# Patient Record
Sex: Female | Born: 1940 | Race: White | Hispanic: No | Smoking: Former smoker
Health system: Southern US, Community
[De-identification: ages and names within clinical notes are randomized; demographics above are authoritative.]

## PROBLEM LIST (undated history)

## (undated) DIAGNOSIS — K449 Diaphragmatic hernia without obstruction or gangrene: Secondary | ICD-10-CM

## (undated) DIAGNOSIS — M1990 Unspecified osteoarthritis, unspecified site: Secondary | ICD-10-CM

## (undated) DIAGNOSIS — F32A Depression, unspecified: Secondary | ICD-10-CM

## (undated) DIAGNOSIS — M549 Dorsalgia, unspecified: Secondary | ICD-10-CM

## (undated) DIAGNOSIS — F419 Anxiety disorder, unspecified: Secondary | ICD-10-CM

## (undated) DIAGNOSIS — G8929 Other chronic pain: Secondary | ICD-10-CM

## (undated) DIAGNOSIS — H269 Unspecified cataract: Secondary | ICD-10-CM

## (undated) DIAGNOSIS — I10 Essential (primary) hypertension: Secondary | ICD-10-CM

## (undated) DIAGNOSIS — M810 Age-related osteoporosis without current pathological fracture: Secondary | ICD-10-CM

## (undated) DIAGNOSIS — R011 Cardiac murmur, unspecified: Secondary | ICD-10-CM

## (undated) DIAGNOSIS — J449 Chronic obstructive pulmonary disease, unspecified: Secondary | ICD-10-CM

## (undated) DIAGNOSIS — Z5189 Encounter for other specified aftercare: Secondary | ICD-10-CM

## (undated) DIAGNOSIS — T7840XA Allergy, unspecified, initial encounter: Secondary | ICD-10-CM

## (undated) DIAGNOSIS — K8020 Calculus of gallbladder without cholecystitis without obstruction: Secondary | ICD-10-CM

## (undated) DIAGNOSIS — K5790 Diverticulosis of intestine, part unspecified, without perforation or abscess without bleeding: Secondary | ICD-10-CM

## (undated) DIAGNOSIS — I499 Cardiac arrhythmia, unspecified: Secondary | ICD-10-CM

## (undated) DIAGNOSIS — E785 Hyperlipidemia, unspecified: Secondary | ICD-10-CM

## (undated) DIAGNOSIS — K279 Peptic ulcer, site unspecified, unspecified as acute or chronic, without hemorrhage or perforation: Secondary | ICD-10-CM

## (undated) DIAGNOSIS — K219 Gastro-esophageal reflux disease without esophagitis: Secondary | ICD-10-CM

## (undated) DIAGNOSIS — IMO0001 Reserved for inherently not codable concepts without codable children: Secondary | ICD-10-CM

## (undated) DIAGNOSIS — F329 Major depressive disorder, single episode, unspecified: Secondary | ICD-10-CM

## (undated) HISTORY — DX: Allergy, unspecified, initial encounter: T78.40XA

## (undated) HISTORY — DX: Unspecified cataract: H26.9

## (undated) HISTORY — DX: Unspecified osteoarthritis, unspecified site: M19.90

## (undated) HISTORY — DX: Anxiety disorder, unspecified: F41.9

## (undated) HISTORY — PX: HERNIA REPAIR: SHX51

## (undated) HISTORY — DX: Major depressive disorder, single episode, unspecified: F32.9

## (undated) HISTORY — DX: Depression, unspecified: F32.A

## (undated) HISTORY — DX: Peptic ulcer, site unspecified, unspecified as acute or chronic, without hemorrhage or perforation: K27.9

## (undated) HISTORY — DX: Cardiac arrhythmia, unspecified: I49.9

## (undated) HISTORY — DX: Hyperlipidemia, unspecified: E78.5

## (undated) HISTORY — DX: Age-related osteoporosis without current pathological fracture: M81.0

## (undated) HISTORY — PX: CHOLECYSTECTOMY: SHX55

## (undated) HISTORY — DX: Gastro-esophageal reflux disease without esophagitis: K21.9

## (undated) HISTORY — DX: Diverticulosis of intestine, part unspecified, without perforation or abscess without bleeding: K57.90

## (undated) HISTORY — DX: Cardiac murmur, unspecified: R01.1

## (undated) HISTORY — DX: Calculus of gallbladder without cholecystitis without obstruction: K80.20

## (undated) HISTORY — DX: Diaphragmatic hernia without obstruction or gangrene: K44.9

## (undated) HISTORY — DX: Essential (primary) hypertension: I10

---

## 1984-11-13 HISTORY — PX: LUMBAR DISC SURGERY: SHX700

## 1999-02-15 ENCOUNTER — Other Ambulatory Visit: Admission: RE | Admit: 1999-02-15 | Discharge: 1999-02-15 | Payer: Self-pay | Admitting: *Deleted

## 1999-04-20 ENCOUNTER — Ambulatory Visit (HOSPITAL_COMMUNITY): Admission: RE | Admit: 1999-04-20 | Discharge: 1999-04-20 | Payer: Self-pay | Admitting: Family Medicine

## 1999-04-20 ENCOUNTER — Encounter: Payer: Self-pay | Admitting: Family Medicine

## 1999-04-26 ENCOUNTER — Ambulatory Visit (HOSPITAL_COMMUNITY): Admission: RE | Admit: 1999-04-26 | Discharge: 1999-04-26 | Payer: Self-pay | Admitting: Family Medicine

## 2000-03-15 ENCOUNTER — Other Ambulatory Visit: Admission: RE | Admit: 2000-03-15 | Discharge: 2000-03-15 | Payer: Self-pay | Admitting: *Deleted

## 2001-07-30 ENCOUNTER — Encounter: Payer: Self-pay | Admitting: Rheumatology

## 2001-07-30 ENCOUNTER — Encounter: Admission: RE | Admit: 2001-07-30 | Discharge: 2001-07-30 | Payer: Self-pay | Admitting: Rheumatology

## 2001-12-23 ENCOUNTER — Encounter: Payer: Self-pay | Admitting: Emergency Medicine

## 2001-12-23 ENCOUNTER — Emergency Department (HOSPITAL_COMMUNITY): Admission: EM | Admit: 2001-12-23 | Discharge: 2001-12-23 | Payer: Self-pay | Admitting: Emergency Medicine

## 2002-04-02 ENCOUNTER — Other Ambulatory Visit: Admission: RE | Admit: 2002-04-02 | Discharge: 2002-04-02 | Payer: Self-pay | Admitting: Family Medicine

## 2002-06-16 ENCOUNTER — Inpatient Hospital Stay (HOSPITAL_COMMUNITY): Admission: EM | Admit: 2002-06-16 | Discharge: 2002-06-17 | Payer: Self-pay | Admitting: Emergency Medicine

## 2002-06-16 ENCOUNTER — Encounter: Payer: Self-pay | Admitting: Emergency Medicine

## 2004-10-12 ENCOUNTER — Ambulatory Visit: Payer: Self-pay | Admitting: Family Medicine

## 2004-10-14 ENCOUNTER — Ambulatory Visit: Payer: Self-pay | Admitting: Cardiology

## 2004-10-25 ENCOUNTER — Ambulatory Visit: Payer: Self-pay | Admitting: Cardiology

## 2004-10-25 ENCOUNTER — Ambulatory Visit: Payer: Self-pay

## 2004-12-09 ENCOUNTER — Ambulatory Visit: Payer: Self-pay | Admitting: Family Medicine

## 2005-01-06 ENCOUNTER — Ambulatory Visit: Payer: Self-pay | Admitting: Family Medicine

## 2005-07-06 ENCOUNTER — Ambulatory Visit: Payer: Self-pay | Admitting: Family Medicine

## 2005-08-22 ENCOUNTER — Ambulatory Visit: Payer: Self-pay | Admitting: Family Medicine

## 2005-11-03 ENCOUNTER — Ambulatory Visit: Payer: Self-pay | Admitting: Family Medicine

## 2005-11-13 HISTORY — PX: OTHER SURGICAL HISTORY: SHX169

## 2006-05-10 ENCOUNTER — Ambulatory Visit: Payer: Self-pay | Admitting: Family Medicine

## 2006-06-13 HISTORY — PX: CERVICAL LAMINECTOMY: SHX94

## 2006-07-03 ENCOUNTER — Ambulatory Visit (HOSPITAL_COMMUNITY): Admission: RE | Admit: 2006-07-03 | Discharge: 2006-07-03 | Payer: Self-pay | Admitting: Neurosurgery

## 2006-07-06 ENCOUNTER — Ambulatory Visit (HOSPITAL_COMMUNITY): Admission: RE | Admit: 2006-07-06 | Discharge: 2006-07-07 | Payer: Self-pay | Admitting: Neurosurgery

## 2006-12-18 ENCOUNTER — Ambulatory Visit: Payer: Self-pay | Admitting: Family Medicine

## 2007-02-11 ENCOUNTER — Ambulatory Visit: Payer: Self-pay | Admitting: Cardiovascular Disease

## 2007-03-05 ENCOUNTER — Ambulatory Visit (HOSPITAL_BASED_OUTPATIENT_CLINIC_OR_DEPARTMENT_OTHER): Admission: RE | Admit: 2007-03-05 | Discharge: 2007-03-05 | Payer: Self-pay | Admitting: Orthopedic Surgery

## 2007-07-03 ENCOUNTER — Encounter: Payer: Self-pay | Admitting: Family Medicine

## 2007-08-16 ENCOUNTER — Telehealth: Payer: Self-pay | Admitting: Family Medicine

## 2007-09-20 ENCOUNTER — Ambulatory Visit: Payer: Self-pay | Admitting: Family Medicine

## 2007-09-20 DIAGNOSIS — J309 Allergic rhinitis, unspecified: Secondary | ICD-10-CM | POA: Insufficient documentation

## 2007-09-20 DIAGNOSIS — I10 Essential (primary) hypertension: Secondary | ICD-10-CM | POA: Insufficient documentation

## 2007-09-20 DIAGNOSIS — M1990 Unspecified osteoarthritis, unspecified site: Secondary | ICD-10-CM | POA: Insufficient documentation

## 2007-09-20 DIAGNOSIS — E785 Hyperlipidemia, unspecified: Secondary | ICD-10-CM

## 2007-09-20 DIAGNOSIS — K219 Gastro-esophageal reflux disease without esophagitis: Secondary | ICD-10-CM

## 2007-09-20 LAB — CONVERTED CEMR LAB
Glucose, Urine, Semiquant: NEGATIVE
Protein, U semiquant: NEGATIVE
Urobilinogen, UA: 0.2
WBC Urine, dipstick: NEGATIVE
pH: 5

## 2007-09-24 LAB — CONVERTED CEMR LAB
Amylase: 70 units/L (ref 27–131)
Basophils Relative: 0.4 % (ref 0.0–1.0)
Bilirubin, Direct: 0.1 mg/dL (ref 0.0–0.3)
CO2: 25 meq/L (ref 19–32)
Eosinophils Absolute: 0.3 10*3/uL (ref 0.0–0.6)
Eosinophils Relative: 5.3 % — ABNORMAL HIGH (ref 0.0–5.0)
GFR calc Af Amer: 129 mL/min
GFR calc non Af Amer: 106 mL/min
Glucose, Bld: 94 mg/dL (ref 70–99)
Hemoglobin: 13.2 g/dL (ref 12.0–15.0)
Lymphocytes Relative: 45.4 % (ref 12.0–46.0)
MCV: 96.1 fL (ref 78.0–100.0)
Monocytes Absolute: 0.4 10*3/uL (ref 0.2–0.7)
Monocytes Relative: 7.7 % (ref 3.0–11.0)
Neutro Abs: 2.2 10*3/uL (ref 1.4–7.7)
Platelets: 307 10*3/uL (ref 150–400)
Potassium: 4.4 meq/L (ref 3.5–5.1)
Total Protein: 6.9 g/dL (ref 6.0–8.3)
WBC: 5.4 10*3/uL (ref 4.5–10.5)

## 2007-09-25 ENCOUNTER — Ambulatory Visit: Payer: Self-pay | Admitting: Cardiology

## 2007-10-02 ENCOUNTER — Ambulatory Visit: Payer: Self-pay | Admitting: Family Medicine

## 2007-10-22 ENCOUNTER — Telehealth: Payer: Self-pay | Admitting: Family Medicine

## 2008-02-10 ENCOUNTER — Telehealth: Payer: Self-pay | Admitting: Family Medicine

## 2008-04-21 ENCOUNTER — Ambulatory Visit: Payer: Self-pay | Admitting: Family Medicine

## 2008-04-21 LAB — CONVERTED CEMR LAB
Bilirubin Urine: NEGATIVE
Glucose, Urine, Semiquant: NEGATIVE
Ketones, urine, test strip: NEGATIVE
Specific Gravity, Urine: 1.01
pH: 6

## 2008-04-29 ENCOUNTER — Telehealth: Payer: Self-pay | Admitting: Family Medicine

## 2008-04-30 ENCOUNTER — Telehealth (INDEPENDENT_AMBULATORY_CARE_PROVIDER_SITE_OTHER): Payer: Self-pay

## 2008-05-04 ENCOUNTER — Ambulatory Visit: Payer: Self-pay | Admitting: Gastroenterology

## 2008-05-05 ENCOUNTER — Telehealth: Payer: Self-pay | Admitting: Family Medicine

## 2008-05-06 ENCOUNTER — Ambulatory Visit: Payer: Self-pay | Admitting: Internal Medicine

## 2008-05-06 LAB — CONVERTED CEMR LAB
BUN: 12 mg/dL (ref 6–23)
CO2: 27 meq/L (ref 19–32)
Chloride: 105 meq/L (ref 96–112)
Eosinophils Absolute: 0.2 10*3/uL (ref 0.0–0.7)
Eosinophils Relative: 2.7 % (ref 0.0–5.0)
GFR calc non Af Amer: 106 mL/min
Lymphocytes Relative: 39.7 % (ref 12.0–46.0)
MCV: 95.3 fL (ref 78.0–100.0)
Monocytes Relative: 7 % (ref 3.0–12.0)
Neutrophils Relative %: 50.4 % (ref 43.0–77.0)
Platelets: 325 10*3/uL (ref 150–400)
Potassium: 4.2 meq/L (ref 3.5–5.1)
WBC: 7 10*3/uL (ref 4.5–10.5)

## 2008-05-07 ENCOUNTER — Telehealth (INDEPENDENT_AMBULATORY_CARE_PROVIDER_SITE_OTHER): Payer: Self-pay

## 2008-05-08 ENCOUNTER — Ambulatory Visit: Payer: Self-pay | Admitting: Cardiology

## 2008-05-21 ENCOUNTER — Ambulatory Visit: Payer: Self-pay | Admitting: Internal Medicine

## 2008-05-26 ENCOUNTER — Telehealth: Payer: Self-pay | Admitting: Internal Medicine

## 2008-05-28 ENCOUNTER — Ambulatory Visit: Payer: Self-pay | Admitting: Internal Medicine

## 2008-05-28 ENCOUNTER — Encounter: Payer: Self-pay | Admitting: Internal Medicine

## 2008-05-28 HISTORY — PX: COLONOSCOPY: SHX174

## 2008-06-04 ENCOUNTER — Encounter: Payer: Self-pay | Admitting: Internal Medicine

## 2008-08-04 ENCOUNTER — Ambulatory Visit: Payer: Self-pay | Admitting: Family Medicine

## 2008-08-04 DIAGNOSIS — J209 Acute bronchitis, unspecified: Secondary | ICD-10-CM

## 2008-09-16 ENCOUNTER — Ambulatory Visit: Payer: Self-pay | Admitting: Internal Medicine

## 2008-09-16 DIAGNOSIS — K5730 Diverticulosis of large intestine without perforation or abscess without bleeding: Secondary | ICD-10-CM | POA: Insufficient documentation

## 2008-09-24 ENCOUNTER — Emergency Department (HOSPITAL_COMMUNITY): Admission: EM | Admit: 2008-09-24 | Discharge: 2008-09-24 | Payer: Self-pay | Admitting: Emergency Medicine

## 2008-09-28 ENCOUNTER — Ambulatory Visit: Payer: Self-pay | Admitting: Family Medicine

## 2008-09-28 DIAGNOSIS — J439 Emphysema, unspecified: Secondary | ICD-10-CM

## 2008-09-28 DIAGNOSIS — F17200 Nicotine dependence, unspecified, uncomplicated: Secondary | ICD-10-CM | POA: Insufficient documentation

## 2008-10-05 ENCOUNTER — Ambulatory Visit: Payer: Self-pay

## 2008-10-05 ENCOUNTER — Encounter: Payer: Self-pay | Admitting: Family Medicine

## 2008-10-09 ENCOUNTER — Telehealth: Payer: Self-pay | Admitting: Family Medicine

## 2008-12-09 ENCOUNTER — Ambulatory Visit: Payer: Self-pay | Admitting: Family Medicine

## 2008-12-09 DIAGNOSIS — R319 Hematuria, unspecified: Secondary | ICD-10-CM

## 2008-12-09 DIAGNOSIS — R1084 Generalized abdominal pain: Secondary | ICD-10-CM | POA: Insufficient documentation

## 2008-12-09 LAB — CONVERTED CEMR LAB
Glucose, Urine, Semiquant: NEGATIVE
Ketones, urine, test strip: NEGATIVE
Nitrite: NEGATIVE
Protein, U semiquant: NEGATIVE
Specific Gravity, Urine: >=1.03
pH: 5

## 2008-12-10 ENCOUNTER — Encounter: Payer: Self-pay | Admitting: Family Medicine

## 2009-02-08 ENCOUNTER — Ambulatory Visit: Payer: Self-pay | Admitting: Family Medicine

## 2009-02-08 DIAGNOSIS — F411 Generalized anxiety disorder: Secondary | ICD-10-CM | POA: Insufficient documentation

## 2009-02-08 LAB — CONVERTED CEMR LAB
Ketones, urine, test strip: NEGATIVE
Specific Gravity, Urine: >=1.03

## 2009-03-02 ENCOUNTER — Ambulatory Visit: Payer: Self-pay | Admitting: Family Medicine

## 2009-03-05 ENCOUNTER — Encounter: Payer: Self-pay | Admitting: Family Medicine

## 2009-03-24 ENCOUNTER — Encounter: Payer: Self-pay | Admitting: Family Medicine

## 2009-05-05 ENCOUNTER — Encounter: Payer: Self-pay | Admitting: Family Medicine

## 2009-05-13 ENCOUNTER — Encounter: Admission: RE | Admit: 2009-05-13 | Discharge: 2009-05-13 | Payer: Self-pay | Admitting: Neurosurgery

## 2009-05-27 ENCOUNTER — Encounter: Payer: Self-pay | Admitting: Family Medicine

## 2009-09-07 ENCOUNTER — Telehealth: Payer: Self-pay | Admitting: Family Medicine

## 2009-09-09 ENCOUNTER — Telehealth: Payer: Self-pay | Admitting: Family Medicine

## 2009-09-10 ENCOUNTER — Ambulatory Visit: Payer: Self-pay | Admitting: Family Medicine

## 2009-09-10 DIAGNOSIS — R252 Cramp and spasm: Secondary | ICD-10-CM

## 2009-09-13 ENCOUNTER — Telehealth (INDEPENDENT_AMBULATORY_CARE_PROVIDER_SITE_OTHER): Payer: Self-pay | Admitting: *Deleted

## 2009-09-13 LAB — CONVERTED CEMR LAB
ALT: 14 U/L
AST: 20 U/L
Albumin: 4.1 g/dL
Alkaline Phosphatase: 77 U/L
BUN: 10 mg/dL
Basophils Absolute: 0 K/uL
Basophils Relative: 0.1 %
Bilirubin, Direct: 0.1 mg/dL
CO2: 28 meq/L
Calcium: 9.3 mg/dL
Chloride: 102 meq/L
Creatinine, Ser: 0.6 mg/dL
Eosinophils Absolute: 0.2 K/uL
Eosinophils Relative: 3.3 %
GFR calc non Af Amer: 105.6 mL/min
Glucose, Bld: 89 mg/dL
HCT: 41.4 %
Hemoglobin: 14 g/dL
Lymphocytes Relative: 35.8 %
Lymphs Abs: 2.5 K/uL
MCHC: 33.9 g/dL
MCV: 103 fL — ABNORMAL HIGH
Monocytes Absolute: 0.5 K/uL
Monocytes Relative: 6.9 %
Neutro Abs: 3.7 K/uL
Neutrophils Relative %: 53.9 %
Platelets: 326 K/uL
Potassium: 3.9 meq/L
RBC: 4.02 M/uL
RDW: 12.1 %
Sodium: 143 meq/L
TSH: 1.31 u[IU]/mL
Total Bilirubin: 0.5 mg/dL
Total Protein: 8.3 g/dL
Vitamin B-12: 272 pg/mL
WBC: 6.9 10*3/microliter

## 2009-09-14 ENCOUNTER — Telehealth: Payer: Self-pay | Admitting: Family Medicine

## 2009-11-09 ENCOUNTER — Ambulatory Visit: Payer: Self-pay | Admitting: Family Medicine

## 2010-02-18 ENCOUNTER — Telehealth: Payer: Self-pay | Admitting: Family Medicine

## 2010-03-25 ENCOUNTER — Telehealth: Payer: Self-pay | Admitting: Family Medicine

## 2010-08-12 ENCOUNTER — Ambulatory Visit: Payer: Self-pay | Admitting: Family Medicine

## 2010-08-12 DIAGNOSIS — R1011 Right upper quadrant pain: Secondary | ICD-10-CM | POA: Insufficient documentation

## 2010-08-17 ENCOUNTER — Encounter: Admission: RE | Admit: 2010-08-17 | Discharge: 2010-08-17 | Payer: Self-pay | Admitting: Family Medicine

## 2010-08-29 ENCOUNTER — Encounter: Payer: Self-pay | Admitting: Family Medicine

## 2010-09-21 ENCOUNTER — Encounter: Payer: Self-pay | Admitting: Family Medicine

## 2010-10-18 ENCOUNTER — Ambulatory Visit: Payer: Self-pay | Admitting: Family Medicine

## 2010-10-18 DIAGNOSIS — K4090 Unilateral inguinal hernia, without obstruction or gangrene, not specified as recurrent: Secondary | ICD-10-CM | POA: Insufficient documentation

## 2010-11-24 ENCOUNTER — Telehealth: Payer: Self-pay | Admitting: Cardiovascular Disease

## 2010-11-24 ENCOUNTER — Encounter: Payer: Self-pay | Admitting: Internal Medicine

## 2010-11-25 ENCOUNTER — Encounter: Payer: Self-pay | Admitting: Internal Medicine

## 2010-11-25 ENCOUNTER — Ambulatory Visit
Admission: RE | Admit: 2010-11-25 | Discharge: 2010-11-25 | Payer: Self-pay | Source: Home / Self Care | Attending: Internal Medicine | Admitting: Internal Medicine

## 2010-11-29 ENCOUNTER — Telehealth: Payer: Self-pay | Admitting: Internal Medicine

## 2010-11-30 ENCOUNTER — Ambulatory Visit
Admission: RE | Admit: 2010-11-30 | Discharge: 2010-11-30 | Payer: Self-pay | Source: Home / Self Care | Attending: Family Medicine | Admitting: Family Medicine

## 2010-12-13 NOTE — Letter (Signed)
Summary: [facility]  [facility]   Imported By: Maryln Gottron 09/30/2010 10:49:50  _____________________________________________________________________  External Attachment:    Type:   Image     Comment:   External Document

## 2010-12-13 NOTE — Progress Notes (Signed)
Summary: refill sertraline  Phone Note From Pharmacy   Caller: [facility] [address]  Call For: refill sertraline  Summary of Call: request for sertraline 100mg  refill Initial call taken by: Duard Brady LPN,  February 18, 2010 4:29 PM  Follow-up for Phone Call        call in #30 with 11 rf Follow-up by: Nelwyn Salisbury MD,  February 18, 2010 5:09 PM    Prescriptions: ZOLOFT 100 MG TABS (SERTRALINE HCL) 1 by mouth once daily  #30 x 11   Entered by:   Duard Brady LPN   Authorized by:   Nelwyn Salisbury MD   Signed by:   Duard Brady LPN on 16/08/9603   Method used:   Faxed to ...       [facility]  [address] # [id number]* (retail)       [address]       [place], [place]  [address]       Ph: [phone] or [phone]       Fax: [phone]   RxID:   [id number]  [facility]ed tp [facility]  KIK

## 2010-12-13 NOTE — Assessment & Plan Note (Signed)
Summary: ?muscle strain in rt side/hurts take deep breaths/cjr   Vital Signs:  Patient profile:   70 year old female Weight:      129 pounds O2 Sat:      95 % Temp:     98.5 degrees F Pulse rate:   58 / minute BP sitting:   110 / 56  (left arm)  Vitals Entered By: Pura Spice, RN (August 12, 2010 1:38 PM) CC: c/o pain rt chest area into back. hurts when takes deep breath. stated painted on Tuesday. some abd discomfort .   History of Present Illness: Here for 2 days of sharp RUQ abdominal pains which radiate around the right side to the right shoulder blade. No SOB but deep breathing makes the pain worse. She does get nausea from time to time. No fever. Appetite is good. No change in urinations or BMs.   Allergies: 1)  ! Codeine 2)  ! Penicillin  Past History:  Past Medical History: Reviewed history from 09/10/2009 and no changes required. 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 Venetia Maxon Left shoulder surgery 07 per Dr. Teressa Senter  Review of Systems  The patient denies anorexia, fever, weight loss, weight gain, vision loss, decreased hearing, hoarseness, syncope, dyspnea on exertion, peripheral edema, prolonged cough, headaches, hemoptysis, melena, hematochezia, severe indigestion/heartburn, hematuria, incontinence, genital sores, muscle weakness, suspicious skin lesions, transient blindness, difficulty walking, depression, unusual weight change, abnormal bleeding, enlarged lymph nodes, angioedema, breast masses, and testicular masses.         Flu Vaccine Consent Questions     Do you have a history of severe allergic reactions to this vaccine? no    Any prior history of allergic reactions to egg and/or gelatin? no    Do you have a sensitivity to the preservative Thimersol? no    Do you have a past  history of Guillan-Barre Syndrome? no    Do you currently have an acute febrile illness? no    Have you ever had a severe reaction to latex? no    Vaccine information given and explained to patient? yes    Are you currently pregnant? no    Lot Number:[id number]   Exp Date:05/13/2011   Site Given  Left Deltoid IM Pura Spice, RN  August 12, 2010 2:07 PM     Physical Exam  General:  Well-developed,well-nourished,in no acute distress; alert,appropriate and cooperative throughout examination Neck:  No deformities, masses, or tenderness noted. Chest Wall:  No deformities, masses, or tenderness noted. Lungs:  Normal respiratory effort, chest expands symmetrically. Lungs are clear to auscultation, no crackles or wheezes. Heart:  Normal rate and regular rhythm. S1 and S2 normal without gallop, murmur, click, rub or other extra sounds. Abdomen:  soft, normal bowel sounds, no distention, no masses, no guarding, no rigidity, no rebound tenderness, no abdominal hernia, no inguinal hernia, no hepatomegaly, and no splenomegaly.  Tender in the RUQ  Msk:  left shoulder has very reduced ROM and it very painful to move   Impression & Recommendations:  Problem # 1:  ABDOMINAL PAIN RIGHT UPPER QUADRANT (ICD-789.01)  Orders: Radiology Referral (Radiology)  Problem # 2:  SHOULDER PAIN (ICD-719.41)  Her updated medication list for this problem includes:    Robaxin-750 750 Mg Tabs (Methocarbamol) .Marland Kitchen... 1 q 6 hours as needed spasm    Aspir-low 81 Mg Tbec (Aspirin) .Marland Kitchen... 1 once daily  pc    Vicodin Hp 10-660 Mg Tabs (Hydrocodone-acetaminophen) .Marland KitchenMarland KitchenMarland KitchenMarland Kitchen 4 times a day  Orders: Orthopedic Surgeon Referral (Ortho Surgeon)  Complete Medication List: 1)  Robaxin-750 750 Mg Tabs (Methocarbamol) .Marland Kitchen.. 1 q 6 hours as needed spasm 2)  Aspir-low 81 Mg Tbec (Aspirin) .Marland Kitchen.. 1 once daily pc 3)  Zoloft 100 Mg Tabs (Sertraline hcl) .Marland Kitchen.. 1 by mouth once daily 4)  Multivitamins Tabs (Multiple vitamin) .Marland Kitchen.. 1 by mouth  once daily 5)  Zantac 150 Mg Caps (Ranitidine hcl) .Marland Kitchen.. 1 by mouth once daily as needed 6)  Ativan 1 Mg Tabs (Lorazepam) .Marland Kitchen.. 1 3  times a day 7)  Atenolol 25 Mg Tabs (Atenolol) .Marland Kitchen.. 1 tablet po once daily 8)  Vicodin Hp 10-660 Mg Tabs (Hydrocodone-acetaminophen) .... 4 times a day 9)  Stool Softener 100 Mg Caps (Docusate sodium) .... 2 capsules by mouth once daily 10)  Wal-mucil Plus Calcium Caps (Psyllium-calcium) .... 5 capsules by mouth once daily 11)  Vitamin D (ergocalciferol) 50000 Unit Caps (Ergocalciferol) .... Take one tablet weekly 12)  Proventil Hfa 108 (90 Base) Mcg/act Aers (Albuterol sulfate) .... Inhale 2 puffs every 4 hours as needed for sob 13)  Hydromet 5-1.5 Mg/34ml Syrp (Hydrocodone-homatropine) .Marland Kitchen.. 1 tsp q 4 hours as needed cough  Other Orders: Admin 1st Vaccine (29528) Flu Vaccine 24yrs + (41324)  Patient Instructions: 1)  refer to Dr. Teressa Senter for left shoulder pain, also get an [place] for the abdomen. I suspect she has gall bladder disease.  Prescriptions: VICODIN HP 10-660 MG  TABS (HYDROCODONE-ACETAMINOPHEN) 4 times a day  #120 x 5   Entered and Authorized by:   Nelwyn Salisbury MD   Signed by:   Nelwyn Salisbury MD on 08/12/2010   Method used:   Print then Give to Patient   RxID:   [id number]

## 2010-12-13 NOTE — Letter (Signed)
Summary: [facility]  [facility] [place]   Imported By: Maryln Gottron 09/06/2010 13:53:44  _____________________________________________________________________  External Attachment:    Type:   Image     Comment:   External Document

## 2010-12-13 NOTE — Progress Notes (Signed)
Summary: REFILL Robaxin  Phone Note Call from Patient   Caller: Patient Call For: Nelwyn Salisbury MD Reason for Call: Refill Medication Summary of Call: WOULD LIKE HER MUSCLE RELAXER [facility]  [phone] Initial call taken by: Duard Brady LPN,  Mar 25, 2010 5:09 PM  Follow-up for Phone Call        call in #120 with 5 rf Follow-up by: Nelwyn Salisbury MD,  Mar 25, 2010 5:13 PM    Prescriptions: ROBAXIN-750 750 MG TABS (METHOCARBAMOL) 1 q 6 hours as needed spasm  #120.0 Each x 5   Entered by:   Kern Reap CMA (AAMA)   Authorized by:   Nelwyn Salisbury MD   Signed by:   Kern Reap CMA (AAMA) on 03/25/2010   Method used:   Electronically to        [facility]  [address] # [id number]* (retail)       [address]       [place], [place]  [address]       Ph: [phone] or [phone]       Fax: [phone]   RxID:   [id number]

## 2010-12-15 NOTE — Assessment & Plan Note (Signed)
Summary: KNOT ON STOMACH//CCM   Vital Signs:  Patient profile:   70 year old female Weight:      129 pounds O2 Sat:      96 % Temp:     97.9 degrees F Pulse rate:   84 / minute BP sitting:   124 / 80  (left arm) Cuff size:   regular  Vitals Entered By: Pura Spice, RN (October 18, 2010 2:20 PM) CC: knot rt lower quadrant    History of Present Illness: Here for 2 months of a painful lump in the right groin and RLQ of the abdomen. BMs are normal. No urinary symptoms.   Allergies: 1)  ! Codeine 2)  ! Penicillin  Past History:  Past Medical History: Reviewed history from 09/10/2009 and no changes required. 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 lumbar disc surgery 1986 Cervical laminectomy 8-07 per Dr. Venetia Maxon Left shoulder surgery 07 per Dr. Teressa Senter  Review of Systems  The patient denies anorexia, fever, weight loss, weight gain, vision loss, decreased hearing, hoarseness, chest pain, syncope, dyspnea on exertion, peripheral edema, prolonged cough, headaches, hemoptysis, melena, hematochezia, severe indigestion/heartburn, hematuria, incontinence, genital sores, muscle weakness, suspicious skin lesions, transient blindness, difficulty walking, depression, unusual weight change, abnormal bleeding, enlarged lymph nodes, angioedema, breast masses, and testicular masses.    Physical Exam  General:  Well-developed,well-nourished,in no acute distress; alert,appropriate and cooperative throughout examination Lungs:  Normal respiratory effort, chest expands symmetrically. Lungs are clear to auscultation, no crackles or wheezes. Heart:  Normal rate and regular rhythm. S1 and S2 normal without gallop, murmur, click, rub or other extra sounds. Abdomen:  soft, normal bowel sounds, no distention, no masses, no  guarding, no rigidity, no rebound tenderness, no hepatomegaly, and no splenomegaly.  Mildly tender in the RLQ. There is a small reducible direct right inguinal hernia    Impression & Recommendations:  Problem # 1:  INGUINAL HERNIA (ICD-550.90)  Orders: Surgical Referral (Surgery)  Complete Medication List: 1)  Robaxin-750 750 Mg Tabs (Methocarbamol) .Marland Kitchen.. 1 q 6 hours as needed spasm 2)  Aspir-low 81 Mg Tbec (Aspirin) .Marland Kitchen.. 1 once daily pc 3)  Zoloft 100 Mg Tabs (Sertraline hcl) .Marland Kitchen.. 1 by mouth once daily 4)  Multivitamins Tabs (Multiple vitamin) .Marland Kitchen.. 1 by mouth once daily 5)  Zantac 150 Mg Caps (Ranitidine hcl) .Marland Kitchen.. 1 by mouth once daily as needed 6)  Ativan 1 Mg Tabs (Lorazepam) .Marland Kitchen.. 1 3  times a day 7)  Atenolol 25 Mg Tabs (Atenolol) .Marland Kitchen.. 1 tablet po once daily 8)  Vicodin Hp 10-660 Mg Tabs (Hydrocodone-acetaminophen) .... 4 times a day 9)  Stool Softener 100 Mg Caps (Docusate sodium) .... 2 capsules by mouth once daily 10)  Wal-mucil Plus Calcium Caps (Psyllium-calcium) .... 5 capsules by mouth once daily 11)  Vitamin D (ergocalciferol) 50000 Unit Caps (Ergocalciferol) .... Take one tablet weekly 12)  Proventil Hfa 108 (90 Base) Mcg/act Aers (Albuterol sulfate) .... Inhale 2 puffs every 4 hours as needed for sob 13)  Hydromet 5-1.5 Mg/30ml Syrp (Hydrocodone-homatropine) .Marland Kitchen.. 1 tsp q 4 hours as needed cough  Patient Instructions: 1)  we will refer her to Surgery   Orders Added: 1)  Est. Patient Level IV [78469] 2)  Surgical Referral [Surgery]

## 2010-12-15 NOTE — Progress Notes (Signed)
Summary: [facility] calling re surgical clearence status   Phone Note From Other Clinic   Caller: [facility] -lisa [phone] Summary of Call: checking on status of surgical clearence Initial call taken by: Glynda Jaeger,  November 24, 2010 10:32 AM  Follow-up for Phone Call        Misty Stanley from [facility] states pt says she saw Dr. Eden Emms in December.  I found no appt at [facility] for this and no office note.  She did see Dr. Clent Ridges in December.  Misty Stanley will follow-up with patient. Mylo Red RN

## 2010-12-15 NOTE — Progress Notes (Signed)
Summary: question on meds  Phone Note Call from Patient Call back at Home Phone [phone]   Caller: Patient Reason for Call: Talk to Nurse Summary of Call: pt has question re meds. Initial call taken by: Roe Coombs,  November 29, 2010 2:49 PM  Follow-up for Phone Call        pt states she was given a Z-pack but the pharmacy gave her liquid which really hurt her stomach.  pt states the antibiotic did not help and she wants something else.  Instructed pt that if antibiotic didn't help - that may not be what she needs and that she should contact her primary care MD for further treatment.  She complaints of mostly sinus drainage, no fever and is concerned that it "doesn't go to her chest".  She will contact her primary MD Follow-up by: Charolotte Capuchin, RN,  November 29, 2010 3:16 PM

## 2010-12-15 NOTE — Assessment & Plan Note (Signed)
Summary: surgical clearance/hernia repair/lg  Medications Added ZITHROMAX 1 GM PACK (AZITHROMYCIN) Take as directed        Visit Type:  Follow-up Primary Provider:  Gershon Crane, MD  CC:  Surgical clearance- hernia repair.  History of Present Illness: patient is a 70 year old with a history of chest pain and palpitations in the past.  She was last seen in cardiology clinic in 2008 by Wende Mott (pre shoulder surgery).  She had a myoview in 2005 that was neg for ischemia. Since she was last seen she has done fairly well  She continues to smoke.  She denies a change in her breathing.  Still able to do some housework.  Limited by arthritis.  Denies chest pains.    Current Medications (verified): 1)  Robaxin-750 750 Mg Tabs (Methocarbamol) .Marland Kitchen.. 1 Q 6 Hours As Needed Spasm 2)  Aspir-Low 81 Mg Tbec (Aspirin) .Marland Kitchen.. 1 Once Daily Pc 3)  Zoloft 100 Mg Tabs (Sertraline Hcl) .Marland Kitchen.. 1 By Mouth Once Daily 4)  Zantac 150 Mg Caps (Ranitidine Hcl) .Marland Kitchen.. 1 By Mouth Once Daily As Needed 5)  Ativan 1 Mg  Tabs (Lorazepam) .Marland Kitchen.. 1 3  Times A Day 6)  Atenolol 25 Mg  Tabs (Atenolol) .Marland Kitchen.. 1 Tablet Po Once Daily 7)  Vicodin Hp 10-660 Mg  Tabs (Hydrocodone-Acetaminophen) .... 4 Times A Day 8)  Stool Softener 100 Mg Caps (Docusate Sodium) .... 2 Capsules By Mouth Once Daily 9)  Proventil Hfa 108 (90 Base) Mcg/act Aers (Albuterol Sulfate) .... Inhale 2 Puffs Every 4 Hours As Needed For Sob  Allergies: 1)  ! Codeine 2)  ! 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 Venetia Maxon Left shoulder surgery 07 per Dr. Teressa Senter  Family History: Last updated: 05/21/2008 Family History of Heart Disease: father No FH of Colon Cancer: Brain  cancer-brother Gastric Ulcers-father  Social History: Last updated: 05/21/2008 Married 3 children 1 boy/2 girls retired Network engineer History:  Patient currently smokes. 1 PPD Alcohol Use - no Daily Caffeine Use 2-3 drinks daily Illicit Drug Use - no  Review of Systems       All systems reviewed.  Neg to the abvoe problem except as noted above.  Vital Signs:  Patient profile:   70 year old female Height:      63 inches Weight:      126.75 pounds BMI:     22.53 Pulse rate:   82 / minute Pulse rhythm:   regular Resp:     18 per minute BP sitting:   102 / 70  (left arm) Cuff size:   regular  Vitals Entered By: Vikki Ports (November 25, 2010 4:02 PM)  Physical Exam  Additional Exam:  patient is in NAD HEENT:  Normocephalic, atraumatic. EOMI, PERRLA.  Neck: JVP is normal. No thyromegaly. No bruits.  Lungs: Some decreased airflow.  No rales no wheezes.  Heart: Regular rate and rhythm. Normal S1, S2. No S3.   No significant murmurs. PMI not displaced.  Abdomen:  Supple, nontender. Normal bowel sounds. No masses. No hepatomegaly.  Extremities:   Good distal pulses throughout. No lower extremity edema.  Musculoskeletal :moving all extremities.  Neuro:   alert and oriented x3.    EKG  Procedure date:  11/25/2010  Findings:      NSR.  82 bpm.  Impression & Recommendations:  Problem # 1:  PRE-OPERATIVE CARDIOVASCULAR EXAMINATION (ICD-V72.81) Patient presents for preop clearance.  She denies symptoms to sugg angina.  She does do some household acitvites without problem.  Overall I think she is at low risk for major cardiac event and is OK to proceed with surgery without furher testing.  Problem # 2:  NICOTINE ADDICTION (ICD-305.1) COunselled on cessation.  Problem # 3:  HYPERTENSION (ICD-401.9) Good control  Continue. Her updated medication list for this problem includes:    Aspir-low 81 Mg Tbec (Aspirin) .Marland Kitchen... 1 once daily pc    Atenolol 25 Mg Tabs  (Atenolol) .Marland Kitchen... 1 tablet po once daily  Problem # 4:  HYPERLIPIDEMIA (ICD-272.4) Not on meds.  SHould be followed.  Other Orders: EKG w/ Interpretation (93000)  Patient Instructions: 1)  Your physician recommends that you continue on your current medications as directed. Please refer to the Current Medication list given to you today. 2)  Your physician discussed the hazards of tobacco use.  Tobacco use cessation is recommended and techniques and options to help you quit were discussed. Prescriptions: ZITHROMAX 1 GM PACK (AZITHROMYCIN) Take as directed  #1 x 0   Entered by:   Lisabeth Devoid RN   Authorized by:   Sherrill Raring, MD, [facility]   Signed by:   Lisabeth Devoid RN on 11/25/2010   Method used:   Electronically to        [facility] # [id number]* (retail)       [address]       [place], [place]  [address]       Ph: [phone] or [phone]       Fax: [phone]   RxID:   [id number]

## 2010-12-15 NOTE — Assessment & Plan Note (Signed)
Summary: ?sinus inf/njr   Vital Signs:  Patient profile:   70 year old female Weight:      126 pounds O2 Sat:      94 % Temp:     98.2 degrees F BP sitting:   130 / 80  (left arm)  Vitals Entered By: Pura Spice, RN (November 30, 2010 1:19 PM) CC: stuffy nose  sneezing   History of Present Illness: Here for one week of chest congestion, coughing up yellow sputum, sinus congestion, and HA. No fever. She took a single dose of Azithromycin a few days ago with no results.   Allergies: 1)  ! Codeine 2)  ! Penicillin  Past History:  Past Medical History: Reviewed history from 09/10/2009 and no changes required. 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 lumbar disc surgery 1986 Cervical laminectomy 8-07 per Dr. Venetia Maxon Left shoulder surgery 07 per Dr. Teressa Senter  Review of Systems  The patient denies anorexia, fever, weight loss, weight gain, vision loss, decreased hearing, hoarseness, chest pain, syncope, dyspnea on exertion, peripheral edema, hemoptysis, abdominal pain, melena, hematochezia, severe indigestion/heartburn, hematuria, incontinence, genital sores, muscle weakness, suspicious skin lesions, transient blindness, difficulty walking, depression, unusual weight change, abnormal bleeding, enlarged lymph nodes, angioedema, breast masses, and testicular masses.    Physical Exam  General:  Well-developed,well-nourished,in no acute distress; alert,appropriate and cooperative throughout examination Head:  Normocephalic and atraumatic without obvious abnormalities. No apparent alopecia or balding. Eyes:  No corneal or conjunctival inflammation noted. EOMI. Perrla. Funduscopic exam benign, without hemorrhages, exudates or papilledema. Vision grossly normal. Ears:  External ear exam shows no significant lesions  or deformities.  Otoscopic examination reveals clear canals, tympanic membranes are intact bilaterally without bulging, retraction, inflammation or discharge. Hearing is grossly normal bilaterally. Nose:  External nasal examination shows no deformity or inflammation. Nasal mucosa are pink and moist without lesions or exudates. Mouth:  Oral mucosa and oropharynx without lesions or exudates.  Teeth in good repair. Neck:  No deformities, masses, or tenderness noted. Lungs:  scattered rhonchi and wheezes Heart:  Normal rate and regular rhythm. S1 and S2 normal without gallop, murmur, click, rub or other extra sounds.   Impression & Recommendations:  Problem # 1:  ACUTE BRONCHITIS (ICD-466.0)  The following medications were removed from the medication list:    Zithromax 1 Gm Pack (Azithromycin) .Marland Kitchen... Take as directed Her updated medication list for this problem includes:    Proventil Hfa 108 (90 Base) Mcg/act Aers (Albuterol sulfate) ..... Inhale 2 puffs every 4 hours as needed for sob    Doxycycline Hyclate 100 Mg Caps (Doxycycline hyclate) .Marland Kitchen..Marland Kitchen Two times a day  Orders: Depo- Medrol 40mg  (J1030) Depo- Medrol 80mg  (J1040) Admin of Therapeutic Inj  intramuscular or subcutaneous (04540)  Complete Medication List: 1)  Robaxin-750 750 Mg Tabs (Methocarbamol) .Marland Kitchen.. 1 q 6 hours as needed spasm 2)  Aspir-low 81 Mg Tbec (Aspirin) .Marland Kitchen.. 1 once daily pc 3)  Zoloft 100 Mg Tabs (Sertraline hcl) .Marland Kitchen.. 1 by mouth once daily 4)  Zantac 150 Mg Caps (Ranitidine hcl) .Marland Kitchen.. 1 by mouth once daily as needed 5)  Ativan 1 Mg Tabs (Lorazepam) .Marland Kitchen.. 1 3  times a day 6)  Atenolol 25 Mg Tabs (Atenolol) .Marland Kitchen.. 1 tablet po once daily 7)  Vicodin Hp 10-660 Mg Tabs (Hydrocodone-acetaminophen) .... 4 times a day 8)  Stool Softener 100 Mg Caps (Docusate sodium) .Marland KitchenMarland KitchenMarland Kitchen  2 capsules by mouth once daily 9)  Proventil Hfa 108 (90 Base) Mcg/act Aers (Albuterol sulfate) .... Inhale 2 puffs every 4 hours as needed for sob 10)   Doxycycline Hyclate 100 Mg Caps (Doxycycline hyclate) .... Two times a day  Patient Instructions: 1)  Please schedule a follow-up appointment as needed .  2)  Use your nebulizer as needed  Prescriptions: DOXYCYCLINE HYCLATE 100 MG CAPS (DOXYCYCLINE HYCLATE) two times a day  #20 x 0   Entered and Authorized by:   Nelwyn Salisbury MD   Signed by:   Nelwyn Salisbury MD on 11/30/2010   Method used:   Electronically to        [facility]  [address] # [id number]* (retail)       [address]       [place], [place]  [address]       Ph: [phone] or [phone]       Fax: [phone]   RxID:   [id number]    Medication Administration  Injection # 1:    Medication: Depo- Medrol 40mg     Diagnosis: ACUTE BRONCHITIS (ICD-466.0)    Route: IM    Site: RUOQ gluteus    Exp Date: 05/2013    Lot #: [id number]    Mfr: Pharmacia    Patient tolerated injection without complications    Given by: Pura Spice, RN (November 30, 2010 2:23 PM)  Injection # 2:    Medication: Depo- Medrol 80mg     Diagnosis: ACUTE BRONCHITIS (ICD-466.0)    Route: IM    Site: RUOQ gluteus    Exp Date: 05/2013    Lot #: [id number]    Mfr: Pharmacia    Patient tolerated injection without complications    Given by: Pura Spice, RN (November 30, 2010 2:23 PM)  Orders Added: 1)  Est. Patient Level IV [27253] 2)  Depo- Medrol 40mg  [J1030] 3)  Depo- Medrol 80mg  [J1040] 4)  Admin of Therapeutic Inj  intramuscular or subcutaneous [66440]

## 2010-12-21 NOTE — Letter (Signed)
Summary: [facility] - Medical Clearance  [facility] - Medical Clearance   Imported By: Marylou Mccoy 12/14/2010 15:54:54  _____________________________________________________________________  External Attachment:    Type:   Image     Comment:   External Document

## 2010-12-31 ENCOUNTER — Other Ambulatory Visit: Payer: Self-pay | Admitting: Family Medicine

## 2011-01-09 ENCOUNTER — Other Ambulatory Visit (HOSPITAL_COMMUNITY): Payer: Self-pay | Admitting: General Surgery

## 2011-01-09 ENCOUNTER — Encounter (HOSPITAL_COMMUNITY)
Admission: RE | Admit: 2011-01-09 | Discharge: 2011-01-09 | Disposition: A | Discharge status: Home / Self Care | Payer: Medicare Other | Source: Ambulatory Visit | Attending: General Surgery | Admitting: General Surgery

## 2011-01-09 ENCOUNTER — Ambulatory Visit (HOSPITAL_COMMUNITY)
Admission: RE | Admit: 2011-01-09 | Discharge: 2011-01-09 | Disposition: A | Discharge status: Home / Self Care | Payer: Medicare Other | Source: Ambulatory Visit | Attending: General Surgery | Admitting: General Surgery

## 2011-01-09 DIAGNOSIS — Z01818 Encounter for other preprocedural examination: Secondary | ICD-10-CM | POA: Insufficient documentation

## 2011-01-09 DIAGNOSIS — Z01810 Encounter for preprocedural cardiovascular examination: Secondary | ICD-10-CM | POA: Insufficient documentation

## 2011-01-09 DIAGNOSIS — K4090 Unilateral inguinal hernia, without obstruction or gangrene, not specified as recurrent: Secondary | ICD-10-CM

## 2011-01-09 DIAGNOSIS — K8020 Calculus of gallbladder without cholecystitis without obstruction: Secondary | ICD-10-CM

## 2011-01-09 DIAGNOSIS — I499 Cardiac arrhythmia, unspecified: Secondary | ICD-10-CM | POA: Insufficient documentation

## 2011-01-09 DIAGNOSIS — Z01812 Encounter for preprocedural laboratory examination: Secondary | ICD-10-CM | POA: Insufficient documentation

## 2011-01-09 DIAGNOSIS — J438 Other emphysema: Secondary | ICD-10-CM | POA: Insufficient documentation

## 2011-01-09 DIAGNOSIS — F17200 Nicotine dependence, unspecified, uncomplicated: Secondary | ICD-10-CM | POA: Insufficient documentation

## 2011-01-09 LAB — COMPREHENSIVE METABOLIC PANEL
ALT: 12 U/L (ref 0–35)
AST: 19 U/L (ref 0–37)
Albumin: 3.7 g/dL (ref 3.5–5.2)
Alkaline Phosphatase: 78 U/L (ref 39–117)
Potassium: 4.7 mEq/L (ref 3.5–5.1)
Sodium: 138 mEq/L (ref 135–145)
Total Protein: 6.5 g/dL (ref 6.0–8.3)

## 2011-01-09 LAB — DIFFERENTIAL
Eosinophils Relative: 3 % (ref 0–5)
Lymphocytes Relative: 39 % (ref 12–46)
Lymphs Abs: 2.8 10*3/uL (ref 0.7–4.0)
Monocytes Absolute: 0.5 10*3/uL (ref 0.1–1.0)
Neutro Abs: 3.6 10*3/uL (ref 1.7–7.7)

## 2011-01-09 LAB — CBC
Platelets: 233 10*3/uL (ref 150–400)
RDW: 12.8 % (ref 11.5–15.5)
WBC: 7.2 10*3/uL (ref 4.0–10.5)

## 2011-01-12 ENCOUNTER — Observation Stay (HOSPITAL_COMMUNITY)
Admission: RE | Admit: 2011-01-12 | Discharge: 2011-01-14 | Disposition: A | Discharge status: Home / Self Care | Payer: Medicare Other | Source: Ambulatory Visit | Attending: General Surgery | Admitting: General Surgery

## 2011-01-12 ENCOUNTER — Other Ambulatory Visit: Payer: Self-pay | Admitting: General Surgery

## 2011-01-12 DIAGNOSIS — F17200 Nicotine dependence, unspecified, uncomplicated: Secondary | ICD-10-CM | POA: Insufficient documentation

## 2011-01-12 DIAGNOSIS — J449 Chronic obstructive pulmonary disease, unspecified: Secondary | ICD-10-CM | POA: Insufficient documentation

## 2011-01-12 DIAGNOSIS — J4489 Other specified chronic obstructive pulmonary disease: Secondary | ICD-10-CM | POA: Insufficient documentation

## 2011-01-12 DIAGNOSIS — Z23 Encounter for immunization: Secondary | ICD-10-CM | POA: Insufficient documentation

## 2011-01-12 DIAGNOSIS — Z8673 Personal history of transient ischemic attack (TIA), and cerebral infarction without residual deficits: Secondary | ICD-10-CM | POA: Insufficient documentation

## 2011-01-12 DIAGNOSIS — K8010 Calculus of gallbladder with chronic cholecystitis without obstruction: Principal | ICD-10-CM | POA: Insufficient documentation

## 2011-01-12 DIAGNOSIS — K219 Gastro-esophageal reflux disease without esophagitis: Secondary | ICD-10-CM | POA: Insufficient documentation

## 2011-01-12 DIAGNOSIS — K4090 Unilateral inguinal hernia, without obstruction or gangrene, not specified as recurrent: Secondary | ICD-10-CM | POA: Insufficient documentation

## 2011-01-17 ENCOUNTER — Other Ambulatory Visit: Payer: Self-pay | Admitting: Family Medicine

## 2011-01-19 NOTE — Op Note (Signed)
NAMEDELAYNIE, STETZER NO.:  [id number]  MEDICAL RECORD NO.:  [id number]           PATIENT TYPE:  I  LOCATION:  5127                         FACILITY:  [facility]  PHYSICIAN:  Cherylynn Ridges, M.D.    DATE OF BIRTH:  03/24/1941  DATE OF PROCEDURE:  01/12/2011 DATE OF DISCHARGE:                              OPERATIVE REPORT   PREOPERATIVE DIAGNOSES:  Symptomatic cholelithiasis and symptomatic right inguinal hernia.  POSTOPERATIVE DIAGNOSES:  Symptomatic cholelithiasis and symptomatic right inguinal hernia with a direct right inguinal hernia.  PROCEDURE: 1. Laparoscopic cholecystectomy. 2. Open right inguinal hernia repair with mesh.  SURGEON:  Cherylynn Ridges, M.D.  ASSISTANT:  Dr. Zachery Dakins assisted on the gallbladder procedure alone. No assistant on the hernia.  ANESTHESIA:  General endotracheal.  ESTIMATED BLOOD LOSS:  Less than 30 mL for the combined procedures.  COMPLICATIONS:  None.  CONDITION:  Stable. FINDINGS:  The patient had direct right inguinal hernia and normal gallbladder anatomy with some mild chronic inflammation.  OPERATION:  The patient was taken to the operating room, placed on table in supine position.  After an adequate time-out was performed identifying the patient and the procedure to be performed, she was prepped initially for the laparoscopic cholecystectomy.  A supraumbilical midline incision was made using #15 blade and taken down to the midline fascia.  We then incised the midline fascia with a 15 blade and while tenting up on the fascia with Kocher clamps, we bluntly dissected down into the peritoneal cavity.  Once this was done, a pursestring suture of 0 Vicryl was passed around the fascial opening which secured in a Hasson cannula which is subsequently passed into the peritoneal cavity.  Once that was done, 2 right upper quadrant 5-mm cannulas and a subxiphoid 5-mm cannula were passed under direct vision.  With the patient  insufflated with carbon dioxide gas, she was placed in reverse Trendelenburg and left-side was tilted down.  The gallbladder was grasped using a ratcheted grasper through the lateral-most cannula and retracted towards the anterior abdominal wall and right upper quadrant.  A second grasper was passed onto the body.  We dissected off some omental adhesions as we got down to the infundibulum.  We grabbed the infundibulum, then opened the window of the peritoneum overlying the triangle of Calot and hepatoduodenal triangle.  We were able to dissect out the cystic duct and cystic artery circumferentially with complete windows around both.  In doing so, we were able to clip the gallbladder along the gallbladder side, then distally the cystic duct x3 and transect the cystic duct.  We identified the cystic artery, clipped it proximally and distally x2, and then transected it.  We then dissected out the gallbladder from its bed with minimal difficulty, retrieving it from the supraumbilical site with minimal difficulty and in an EndoCatch bag.  There was a small spillage of bile during the process.  Once the gallbladder was retrieved, we used a pursestring suture in place at the supraumbilical site to close off the fascia there.  We irrigated with saline in the peritoneal cavity and the gallbladder fossa and  minimal bleeding and bile was noted.  We aspirated all fluid and gas from above the liver, then removed all cannulas.  We injected 0.5% Marcaine with epi at all sites, closed the supraumbilical skin site using running subcuticular stitch of 4-0 Monocryl.  All the others the incision were closed using Dermabond, Steri-Strips, Tegaderm was used also to reinforce the supraumbilical site.  Once these dressings were in place, we broke down the entire draping, reprepped and draped the second time, also a time-out for the right inguinal hernia.  A transverse curvilinear incision was made on the  right side in the suprainguinal level using #10 blade and taken out through the subcutaneous tissue down into Scarpa's to the external oblique fascia. We opened the fascia along its fibers down through the superficial ring. Once we did this, we could identify what appeared to be a large direct defect that extended laterally through the external oblique and transverse abdominis muscle.  This defect was closed using interrupted 0 Ethibond sutures and then reinforced with an oval piece of mesh measuring 5 x 3 cm in size, tacked down using a running 0 Prolene suture.  This was lateral to the round ligament and we did not really have to open up that area in order to repair the hernia.  We then closed the external oblique fascia on top of that and then irrigated with antibiotic solution in which the mesh had been soaked.  We closed Scarpa's fascia using interrupted 3-0 Vicryl and then the skin was closed using a running subcuticular stitch of 4-0 Monocryl.  A 0.5% Marcaine without epinephrine was injected in to the skin and subcutaneous and surrounding areas.  All needle, sponge counts, and instrument counts were correct.  Dermabond, Steri-Strips, and Tegaderm were used to complete the dressing.  All counts were correct.     Cherylynn Ridges, M.D.     JOW/MEDQ  D:  01/12/2011  T:  01/13/2011  Job:  [id number]  Electronically Signed by Jimmye Norman M.D. on 01/18/2011 01:55:23 PM

## 2011-02-15 NOTE — Discharge Summary (Signed)
  NAMESELENA, [name] NO.:  [id number]  MEDICAL RECORD NO.:  [id number]           PATIENT TYPE:  I  LOCATION:  5127                         FACILITY:  [facility]  PHYSICIAN:  Cherylynn Ridges, M.D.    DATE OF BIRTH:  1941-04-15  DATE OF ADMISSION:  01/12/2011 DATE OF DISCHARGE:  01/14/2011                              DISCHARGE SUMMARY   DISCHARGE DIAGNOSES: 1. Symptomatic cholelithiasis. 2. Symptomatic right inguinal hernia.  PRINCIPAL PROCEDURE: 1. Right inguinal hernia repair, open. 2. Laparoscopic cholecystectomy.  SURGEON:  Cherylynn Ridges, M.D.  She was discharged home in care of her family.  Her diet on discharge is regular.  CONDITION:  Stable.  She was able to shower, pat her wounds dry.  She was return to see Dr. Lindie Spruce in 2 weeks.  BRIEF SUMMARY OF HOSPITAL COURSE:  The patient is a 69-year female with a symptomatic right inguinal hernia and symptomatic gallstones who came in for elective repair of both.  This was done on one anesthetic.  She was taken to the operating room on the day of admission, January 12, 2011, underwent an uneventful laparoscopic cholecystectomy and right inguinal hernia repair.  She was discharged home in care of her family.  Vicodin was given to her for pain.  She will return to see me in 2 weeks.     Cherylynn Ridges, M.D.     JOW/MEDQ  D:  01/30/2011  T:  01/31/2011  Job:  [id number]  Electronically Signed by Jimmye Norman M.D. on 02/15/2011 04:26:18 PM

## 2011-02-23 ENCOUNTER — Other Ambulatory Visit: Payer: Self-pay | Admitting: Family Medicine

## 2011-02-24 NOTE — Telephone Encounter (Signed)
Rx called in ,

## 2011-02-24 NOTE — Telephone Encounter (Signed)
May refill once only with further refills per Dr Clent Ridges.

## 2011-02-28 ENCOUNTER — Other Ambulatory Visit: Payer: Self-pay

## 2011-02-28 NOTE — Telephone Encounter (Signed)
rx hydrocodone  10-660 mg

## 2011-03-01 MED ORDER — HYDROCODONE-ACETAMINOPHEN 10-660 MG PO TABS: 1.0000 | ORAL_TABLET | Freq: Four times a day (QID) | ORAL | Status: DC | PRN

## 2011-03-01 NOTE — Telephone Encounter (Signed)
rx called into [facility] 

## 2011-03-01 NOTE — Telephone Encounter (Signed)
Call in #[phone] with 5 rf 

## 2011-03-06 ENCOUNTER — Other Ambulatory Visit: Payer: Self-pay | Admitting: Family Medicine

## 2011-03-31 ENCOUNTER — Other Ambulatory Visit: Payer: Self-pay | Admitting: Family Medicine

## 2011-03-31 NOTE — Telephone Encounter (Signed)
Call in #[phone] with 5 rf 

## 2011-03-31 NOTE — Assessment & Plan Note (Signed)
[facility]                            CARDIOLOGY OFFICE NOTE   [name], [name]                      MRN:          [id number]  DATE:02/11/2007                            DOB:          12-Sep-1941    PRIMARY CARE PHYSICIAN:  Dr. Gershon Crane.   PRIMARY CARDIOLOGIST:  Dr. Charlies Constable.   HISTORY OF PRESENT ILLNESS:  Angelica [name] is a 70 year old female patient  followed by Dr. Clent Ridges who saw Dr. Juanda Chance back in December of 2005 with  complaints of palpitations and chest pain.  Her pain was felt to be  atypical for ischemia and she was set up for a stress nuclear study.  This revealed no evidence of ischemia and an EF of 74%.  She apparently  was treated with beta-blockers for palpitations.  She has done fine  since that time and we have actually not seen her back since then.  She  needs to have some shoulder surgery done in the next couple of weeks.  She just had neck surgery back in August of 2007 and actually did fine  with that.  She denies any recent history of chest pain or shortness of  breath.  She is active and can do most of the activities she wants to  aside from using her shoulder recently without any chest pain or  shortness of breath.  She denies syncope or near syncope.  She denies  any orthopnea or paroxysmal nocturnal dyspnea.  She denies any lower  extremity edema.   CURRENT MEDICATIONS:  1. Toprol-XL 25 mg twice a day.  2. Ativan 0.5 mg q.h.s.  3. Robaxin twice daily.  4. Vytorin 10/40 mg daily.  5. Vicodin p.r.n.  6. Percocet p.r.n.  7. Zantac p.r.n.   ALLERGIES:  1. PENICILLIN.  2. CODEINE.   SOCIAL HISTORY:  1. She continues to smoke cigarettes.  2. She is married and has 3 children.   FAMILY HISTORY:  Positive for coronary artery disease.  Her father had a  myocardial infarction and a CVA and died at age 38.   REVIEW OF SYSTEMS:  Please see HPI.  Denies any fever, chills, cough,  melena, hematochezia, dysuria.  The  rest of the review of systems are  negative.   PHYSICAL EXAM:  She is a well-nourished, well-developed female.  She has  blood pressure 120/76, pulse 91, weight 138 pounds.  HEENT:  Unremarkable.  NECK:  Without JVD.  CARDIAC:  Normal S1, S2.  Regular rate and rhythm without murmurs.  LUNGS:  Clear to auscultation bilaterally without wheezing, rhonchi or  rales.  ABDOMEN:  Soft, nontender.  EXTREMITIES:  Without edema.  SKIN:  Warm and dry.  NEUROLOGIC:  She is alert and oriented x3.  Cranial nerves II-XII  grossly intact.   Electrocardiogram reveals sinus rhythm with a heart rate of 91, normal  axis, nonspecific ST-T wave changes, no significant changes on previous  tracing.   IMPRESSION:  1. Hyperlipidemia.  2. Family history of coronary artery disease.  3. Smoker.  4. Degenerative joint disease.  5. Degenerative disk disease status post  multiple surgeries.  6. History of gastroesophageal reflux disease and peptic ulcer      disease.  7. History of palpitations, now quiescent on beta blocker therapy   PLAN:  The patient presents to the office today for surgical clearance.  She needs to have left shoulder surgery in the next several weeks.  She  is not having any symptoms of chest pain or shortness of breath to  suggest ischemia.  She had a nonischemic Myoview scan in December of  2005.  At this point in time she requires no further cardiac workup  prior to her noncardiac surgery.  According to [facility] and AHA guidelines,  she should be at acceptable risk.  She was also interviewed and examined  by Dr. Eden Emms today.  Our service will certainly be available in the  perioperative period as necessary.      Tereso Newcomer, PA-C  Electronically Signed      Noralyn Pick. Eden Emms, MD, [facility]  Electronically Signed   SW/MedQ  DD: 02/11/2007  DT: 02/11/2007  Job #: [id number]   cc:   Jeannett Senior A. Clent Ridges, MD  Katy Fitch Sypher, M.D.

## 2011-03-31 NOTE — H&P (Signed)
NAME:  Angelica [name], Angelica [name]                         ACCOUNT NO.:  [id number]   MEDICAL RECORD NO.:  [id number]                   PATIENT TYPE:  INP   LOCATION:  1823                                 FACILITY:  [facility]   PHYSICIAN:  Rosalyn Gess. Norins, M.D. [facility]         DATE OF BIRTH:  [birth year]   DATE OF ADMISSION:  06/16/2002  DATE OF DISCHARGE:                                HISTORY & PHYSICAL   CHIEF COMPLAINT:  Chest pain.   HISTORY OF PRESENT ILLNESS:  Angelica [name] is a 70 year old, married, white  female with no prior history of heart disease. She reports the onset of  chest fluttering at about 1600 hours on August 3. Symptoms got worse during  the evening accompanied by a pressure in the left chest with radiation of  minimal discomfort to the neck and left arm described as a mild burning. She  denies any shortness of breath, had no diaphoresis, no nausea. Her symptoms  kept her from being able to sleep until she came to the emergency department  for evaluation. Initial EKG revealed significant ectopy with __________  with no acute injury. Initial enzymes were normal. Dr. [place] started a  nitroglycerin drip which gave the patient approximately a 50 percent or so  relief of her discomfort. She is now admitted to rule out MI.   CARDIAC RISK FACTORS:  Positive family history with her father dying of MI  at 37. The patient is postmenopausal but on hormone replacement therapy.  Positive for tobacco abuse, positive for hyperlipidemia. Negative for  hypertension. Negative for diabetes. Negative for obesity.   PAST MEDICAL HISTORY:  Surgical:  Lumbar diskectomy.  GYN: Gravida 3 para 3.  Medical: Usual childhood diseases. DJD involving neck, low back, hip and  knee.  GERD with a history of hiatal hernia.   MEDICATIONS:  Prempro daily, Celebrex 200 mg once or twice daily, Nexium 40  mg daily, Allegra 180 mg daily, Vicodin 5/500 p.r.n., calcium with vitamin  D, aspirin 81 mg daily.   HABITS:   Tobacco 1 pack per day with a greater than 30 pack-year history,  Caffeine 3 or so 12 ounce cans of diet soda a day. The patient uses alcohol  rarely.   DRUG ALLERGIES:  PENICILLIN with urticaria, CODEINE with restless leg.   FAMILY HISTORY:  With MI in her father at 4. Her mother died at age 28 of  complications of Alzheimer's. No family history for breast cancer, colon  cancer, diabetes.   HEALTH MAINTENANCE:  Patient reports she has had her annual mammogram. She  has not had colon cancer screening. She has had a full physical exam with  Dr. Clent Ridges in the last several months.   REVIEW OF SYMPTOMS:  CONSTITUTIONAL, PULMONARY, CARDIOVASCULAR:  Negative  for problems except as in the HPI GASTROINTESTINAL:  Positive for GERD.  History of ulcer but no change in bowel habit. GYNECOLOGIC:  Negative for  GYN  problems.  MUSCULOSKELETAL:  Positive on MSK as noted.   PHYSICAL EXAMINATION:  VITAL SIGNS:  Temperature was 98, blood pressure  95/60, heart rate was 80, respirations 20, O2 sat was 96 percent on room  air. This is a well-nourished, well-developed woman in no acute distress.  HEENT: Normocephalic, atraumatic. Conjunctivae and sclerae were clear.  Oropharynx without lesions.  NECK: Supple without thyromegaly.  NODES: No adenopathy was noted in the cervical supraclavicular or inguinal  regions.  CHEST: No CVA tenderness. Lungs were clear to auscultation and percussion.  BREASTS:  Deferred to Dr. Clent Ridges.  CARDIOVASCULAR:  Radial pulse 2+, no JVD or carotid bruits. Quiet  precordium.  Her heart rate was regular with premature beats.  ABDOMEN: Tender at the epigastrium and left lower quadrant with no guarding  or rebound. No masses. No organomegaly or splenomegaly.  PELVIC:  Deferred to Dr. Clent Ridges.  RECTAL:  Deferred to Dr. Clent Ridges.  EXTREMITIES: Without clubbing, cyanosis, edema or deformity.  NEUROLOGIC: Grossly nonfocal.   DATABASE:  A 12-lead EKG with normal sinus rhythm with PVCs and  __________ .  She had mild ST-T wave flattening in AV1 and AV2 but no signs of acute  injury.  CK was 81, troponin I was 0.01, hemoglobin 13.1, hematocrit was  38.4, white count was 8,200 with a normal differential.  Chemistries reveal a potassium of  4.1, creatinine 1.9, glucose was 129.  Chest x-ray was negative.   ASSESSMENT:  Cardiovascular. Patient with moderate risk factors with  atypical chest pain and no evidence of  injury on EKG. She is comfortable at  the time of the exam with mild chest pressure. Initial enzymes as noted were  normal. Patient had incomplete relief of her discomfort with nitroglycerin  drip.   PLAN:  1. Rule out MI per protocol. We will discontinue nitroglycerin infusion.     Patient is a candidate for a stress Cardiolite as an outpatient if she     rules out, candidate for cardiology consultation if she rules in. Patient     may need beta blocker long-term for ectopy.  2. GI: History of GERD as noted. Continue P.P.I.  3. Tobacco abuse. Patient is to follow up with Dr. Clent Ridges but she has already     been adamantly encouraged to stop smoking.  4. MSK. Patient to continue on her Celebrex as well as Vicodin as needed.                                               Rosalyn Gess Norins, M.D. [facility]    MEN/MEDQ  D:  06/16/2002  T:  06/20/2002  Job:  [id number]   cc:   Jeannett Senior A. Clent Ridges, M.D. [facility]

## 2011-03-31 NOTE — Discharge Summary (Signed)
   NAME:  Angelica [name], PACIFICO                         ACCOUNT NO.:  [id number]   MEDICAL RECORD NO.:  [id number]                   PATIENT TYPE:  INP   LOCATION:  2004                                 FACILITY:  [facility]   PHYSICIAN:  Stacie Glaze, M.D. [facility]           DATE OF BIRTH:  Dec 23, 1940   DATE OF ADMISSION:  DATE OF DISCHARGE:  06/17/2002                                 DISCHARGE SUMMARY   DISCHARGE DIAGNOSIS:  1. Chest pain.  2) Heart palpitations.   HISTORY OF PRESENT ILLNESS:  Ms. Angelica [name] is a 70 year old white female with  no prior history of coronary disease.  She presents with chest fluttering.  She states that symptoms progressed to left chest pressure with radiation to  the neck and arms.  She denied any shortness of breath, diaphoresis, or  nausea.  The patient presented to the Emergency Department where EKG  revealed significant ectopy but no acute injury.  Initial enzymes were  negative.   PAST MEDICAL HISTORY:  1. Degenerative joint disease and disk disease.  2) Gastroesophageal reflux     disease.  3) Hiatal hernia.  4) Status post lumbar diskectomy.  5)     Tobacco abuse.   HOSPITAL COURSE:  1. Cardiovascular:  The patient presented with chest pain.  She was admitted     to rule out myocardial infarction.  Cardiac enzymes were negative.  EKG     was without ischemia.  The patient has had a stress Cardiolite, initial     results are negative, final results are still pending.  If this is     negative the patient will be discharged home.  The patient has been     started on a beta blocker for her ectopy.  She does describe decreased     sensation of heart fluttering.   1. GI:  The patient has a history of gastroesophageal reflux disease and     will have her continue her proton pump inhibitor.   1. Tobacco abuse:  The patient has been encouraged to stop smoking.   LABORATORY DATA:  Labs on discharge total cholesterol was elevated 223,  triglycerides 197, HDL 57,  LDL 127.  Otherwise CMET and CBC were normal.  Coags were normal. Cardiac enzymes were negative.   DISCHARGE MEDICATIONS:  Allegra 180 mg q.d., Celebrex 200 mg a day as at  home, Nexium 40 mg q.d., Prempro as at home, aspirin 81 mg q.d., Lopressor  25 mg b.i.d.   Followup with Dr. Clent Ridges in 2-3 weeks.     Cornell Barman, PA [facility]                    Stacie Glaze, M.D. [facility]    LC/MEDQ  D:  06/17/2002  T:  06/21/2002  Job:  [id number]   cc:   Tera Mater. Clent Ridges, M.D. [facility]

## 2011-03-31 NOTE — Op Note (Signed)
NAMEDORRAINE, ELLENDER               ACCOUNT NO.:  [id number]   MEDICAL RECORD NO.:  [id number]          PATIENT TYPE:  AMB   LOCATION:  DSC                          FACILITY:  [facility]   PHYSICIAN:  Katy Fitch. Sypher, M.D. DATE OF BIRTH:  06/13/1941   DATE OF PROCEDURE:  03/05/2007  DATE OF DISCHARGE:                               OPERATIVE REPORT   PREOPERATIVE DIAGNOSIS:  Rule out bilateral shoulder adhesive  capsulitis, left greater than right, with AC degenerative change and  rule out significant rotator cuff pathology.   POSTOPERATIVE DIAGNOSES:  1. 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  2. 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   OPERATION:  1. Examination of right shoulder under anesthesia, documenting near      normal range of motion except for slight anterior capsular      contracture.  2. Examination of left shoulder under anesthesia revealing significant      anterior capsular contracture and crepitation consistent with      possible impingement and/or glenohumeral arthrosis.  3. Diagnostic arthroscopy left glenohumeral joint with documentation      of extensive glenohumeral degenerative arthritis with grade III      chondromalacia of humeral head and grade IV chondromalacia of      glenoid, labral degenerative tearing, anterior capsular contracture      and adhesive capsulitis granulation tissues.  4. Subacromial examination with documentation of anterior acromial      osteophyte formation causing stage II impingement with bursitis and      adhesions between bursa and rotator cuff tendons, followed by      acromioplasty and tenolysis of  rotator cuff with bursectomy and      partial debridement of coracoacromial ligament.   OPERATING SURGEON:  Katy Fitch. Sypher, M.D.   ASSISTANT:  Molly Maduro Dasnoit PA-C.   ANESTHESIA:  General endotracheal supplemented by left interscalene  block.   SUPERVISING ANESTHESIOLOGIST:  Guadalupe Maple, M.D.   INDICATIONS:  Brinna Divelbiss is a 70 year old woman referred through  the courtesy of Dr. Gabriel Rung D. Venetia Maxon, Midwife.  She has a history of  bilateral shoulder pain.  Dr. Venetia Maxon had been caring for Ms. Badalamenti's  cervical spine degenerative arthritis.   She has had a very difficult bilateral shoulder pain predicament with  progressive loss of range of motion on the left side.   Our initial impression was that she had adhesive capsulitis with  features of impingement bilaterally.  She was referred for supervised  therapy, but due to a number of factors was only able to complete a  small number of therapy visits.   She did not make significant process and had a severe chronic pain  predicament.   We  advised Ms. Weinel to proceed directly to examination of her  shoulders under anesthesia followed by diagnostic arthroscopy of the  left shoulder, anticipating debridement of the glenohumeral joint,  subacromial decompression and possible distal clavicle resection.   After informed consent, she is brought to the operating room at this  time.   Preoperatively, she was advised that we could not guarantee complete  pain relief nor could we guarantee range of motion recovery in that we  do not have a part store and her illness may be primarily degenerative  in nature.  She understands that should she be found to have a full-  thickness rotator cuff tear, we would proceed with repair of the rotator  cuff.   After informed consent, she is brought to the operating room at this  time.   PROCEDURE:  Shaquala Broeker is brought to the operating room and placed  in supine position upon the  operating table.   Following an anesthesia consultation by Dr. Kipp Brood, general  anesthesia by endotracheal technique supplemented by a left interscalene  block was recommended and accepted by Ms. Furrow.   Preoperatively, her medical records were reviewed including records of  her attending cardiologist.   Ms. Lalley was interviewed in the holding area and questions were  invited and answered.   She was subsequently transferred to room one, placed in the supine  position upon the operating table and under Dr. Morley Kos direct  supervision, general endotracheal anesthesia induced.   She was carefully positioned in the beach-chair position with the aid of  a torso and head holder designed for shoulder arthroscopy with passive  compression devices on her calves for deep vein thrombosis prophylaxis  and careful padding of all of her bony prominences and exposed nerves.   The right shoulder was examined under anesthesia and found to have near  normal motion with elevation 175 degrees external  rotation 90 degrees  at 90 degrees abduction and internal rotation of 80 degrees.  She is  noted to have a very mild anterior capsular contracture with failure to  achieve the scapular plane in extension by 10 degrees.  Examination of  the left shoulder under anesthesia revealed elevation 160 degrees,  external rotation of 85 degrees, internal rotation of 70 degrees and  lack of extension of the scapular plane of -20 degrees.  She appeared to  have primarily a capsular contracture on the left.  She was noted have  subacromial and glenohumeral crepitation with examination under  anesthesia.   After routine DuraPrep of the left forequarter and arm, impervious  arthroscopy drapes were applied.  The arthroscope was introduced through  a standard posterior viewing portal with blunt technique.  Diagnostic arthroscopy immediately revealed grade III chondromalacia the humeral  head, areas of  grade IV chondromalacia on the inferior humeral head and  extensive areas of grade IV chondromalacia of the glenoid.  There was  degenerative change of the anterior labrum, inferior labrum and inferior  posterior labrum.  The long head of the biceps had a stable origin at  the superior labrum and was normal through the rotator interval.  The  deep surface of the rotator cuff was inspected and found to be intact.  The anterior capsule was contracted with granulation tissues anteriorly  superiorly and the deep surface of the supraspinatus obliterating the  anterior capsular ligaments, the subscapularis and the inferior pouch.  A suction shaver was introduced anteriorly and used to perform  debridement of granulation  tissues, debridement of the areas of  chondromalacia with fragments hanging free within the joint on both the  humeral and glenoid sides, followed by thorough irrigation of the  glenohumeral joint removing all debris.  Bipolar cautery was used to  obtain hemostasis in the capsule and to release the anterior capsular  adhesions with the cutting cautery.  Suction shaver was used to remove  the scarred anterior capsule.   After complete debridement of the glenohumeral joint, the scope was  placed in the subacromial space.   The subacromial space was obliterated by adhesive bursitis.  After  thorough bursectomy and tenolysis of the rotator cuff tendons.  The  impinging osteophyte on the anterior surface of the acromion was  documented with a digital camera subsequently leveled with suction  shaver and bur to a type I morphology.  The [facility] joint was not prominent  and did not appear to be part of the pathology.  After thorough  debridement of the subacromial bursa and tenolysis of the tendons, a  small portion of the coracoacromial ligament that was adherent to the  bursa was resected.  Given our findings of significant degenerative  arthritis, the coracoacromial ligament was  preserved anticipating  possible arthroplasty for reconstruction at a later date.   After hemostasis is achieved in the subacromial space and a photographic  documentation of the spur had been completed, the arthroscope was  removed followed by suture of the portals with mattress suture of 3-0  Prolene.   There were no apparent complications.  Ms. Harewood tolerated surgery and  anesthesia well.  She is transferred to the recovery room with stable  vital signs.     Note, for aftercare, she is provided prescriptions for Dilaudid 2 mg  one or two tablets p.o. q.4-6h. p.r.n. pain 30 tablets without refill,  Motrin 600 mg one p.o. q.6h. p.r.n. pain 30 tablets with one refill and  doxycycline 100 mg p.o. b.i.d. x4 days as a prophylactic antibiotic.      Katy Fitch Sypher, M.D.  Electronically Signed     RVS/MEDQ  D:  03/05/2007  T:  03/05/2007  Job:  [id number]   cc:   Danae Orleans. Venetia Maxon, M.D.

## 2011-03-31 NOTE — Op Note (Signed)
NAMEDERENDA, GIDDINGS NO.:  [id number]   MEDICAL RECORD NO.:  [id number]          PATIENT TYPE:  INP   LOCATION:  3019                         FACILITY:  [facility]   PHYSICIAN:  Danae Orleans. Venetia Maxon, M.D.  DATE OF BIRTH:  Oct 16, 1941   DATE OF PROCEDURE:  07/06/2006  DATE OF DISCHARGE:                                 OPERATIVE REPORT   PREOPERATIVE DIAGNOSIS:  Herniated cervical disk with spondylosis,  degenerative disk disease, and radiculopathy, C4-C5 level.   POSTOPERATIVE DIAGNOSIS:  Herniated cervical disk with spondylosis,  degenerative disk disease, and radiculopathy, C4-C5 level.   PROCEDURE:  Anterior cervical decompression and fusion C4-C5 with PEEK  interbody spacer and anterior cervical plate.   SURGEON:  Danae Orleans. Venetia Maxon, M.D.   ANESTHESIA:  General endotracheal anesthesia.   ESTIMATED BLOOD LOSS:  Minimal.   COMPLICATIONS:  None.   DISPOSITION:  To recovery room.   INDICATIONS:  Angelica [name] is a 70 year old woman with a severe left C5  radiculopathy.  She has a herniated disk and spondylosis at C4-C5.  It was  elected to take her to surgery for anterior cervical decompression of the  affected level.   DESCRIPTION OF PROCEDURE:  Angelica [name] was brought to the operating room.  Following satisfactory and uncomplicated induction of general endotracheal  anesthesia and placement of intravenous lines, the patient was placed in the  supine position on the operating table.  Her neck was placed in slight  extension.  She was placed in 10 pounds of halter traction.  Her anterior  neck was then prepped and draped in the usual sterile fashion.  The area of  planned incision was infiltrated with 0.25% Marcaine and 0.5% lidocaine with  1:100,000 epinephrine.  An incision was made from the midline to the  anterior border of the sternocleidomastoid muscle and carried sharply  through the platysma layer.  Subplatysmal dissection was performed exposing  the  anterior border of the sternocleidomastoid muscle. Using blunt  dissection, the carotid sheath was kept lateral and the trachea and  esophagus kept medial exposing the anterior cervical spine.  A bent spinal  needle was placed at what was felt to be the C4-C5 level and this was, in  fact, the C5-C6 level.  A second x-ray was obtained with the needle at the  C4-C5 level and this was confirmed.  Subsequently, the interspace was  incised.  Disk material was removed in a piecemeal fashion.  Prior to doing  so, the longus colli muscles were take down from the anterior cervical spine  and Shadowline self-retaining retractor was placed.  The interspace was  incised and the disk material was removed in a piecemeal fashion.  Endplates  were decorticated with the high speed drill and uncinate spurs were drilled  down and removed in a piecemeal fashion.  Both neural foramen were widely  decompressed.  There was a significant amount of degenerated disk material  in the left neural foramen.  This was decompressed.  Hemostasis was assured  and subsequently after trial sizing, a 6 mm PEEK interbody cage was packed  with Osteocel  and morselized bone autograft inserted in the interspace and  countersunk appropriately.  A 14 mm anterior cervical plate was then affixed  to the anterior cervical spine using 14 mm variable screws, two at C4, two  at C5.  All screws had excellent purchase.  Final x-ray demonstrated well  positioned interbody graft and anterior cervical plate.  The traction was  removed prior to placement of the plate and the plate locking mechanism was  engaged.  The wound was then irrigated with Bacitracin saline.  Soft tissues  were inspected and found to be in good repair.  Hemostasis was assured.  The  platysma layer was then reapproximated with 0 Vicryl sutures and skin edges  reapproximated with interrupted 3-0 Vicryl subcuticular stitch.  The wound  was dressed with Dermabond.  The patient  was extubated in the operating room  and taken to the recovery room in stable satisfactory condition having  tolerated the operation well.  Counts were correct at the end of the case.      Danae Orleans. Venetia Maxon, M.D.  Electronically Signed     JDS/MEDQ  D:  07/06/2006  T:  07/07/2006  Job:  [id number]

## 2011-04-06 NOTE — Telephone Encounter (Signed)
Rx Done . 

## 2011-04-08 ENCOUNTER — Other Ambulatory Visit: Payer: Self-pay | Admitting: Family Medicine

## 2011-04-11 ENCOUNTER — Other Ambulatory Visit: Payer: Self-pay | Admitting: *Deleted

## 2011-05-16 ENCOUNTER — Encounter: Payer: Self-pay | Admitting: Internal Medicine

## 2011-06-05 ENCOUNTER — Telehealth: Payer: Self-pay

## 2011-06-05 NOTE — Telephone Encounter (Signed)
Spoke with pt's daughter who states that pt has shingles and would like to know if something can be sent to pharmacy for her. Pt is unable to schedule ov because her husband passed away on 04-13-2023 and they are very busy with funeral arrangements. Please advise

## 2011-06-05 NOTE — Telephone Encounter (Signed)
This is something I really need to see her for. We treat it with steroids and antiviral meds, and we need to know what we are dealing with

## 2011-06-06 NOTE — Telephone Encounter (Signed)
Left message for pt or daughter to call back to schedule appointment

## 2011-07-11 ENCOUNTER — Other Ambulatory Visit: Payer: Self-pay | Admitting: Family Medicine

## 2011-08-15 LAB — CBC
HCT: 38.6
Hemoglobin: 13.2
MCHC: 34.3
RDW: 12.6

## 2011-08-15 LAB — POCT I-STAT, CHEM 8
BUN: 7
Calcium, Ion: 1.1 — ABNORMAL LOW
TCO2: 20

## 2011-08-15 LAB — DIFFERENTIAL
Basophils Absolute: 0
Basophils Relative: 0
Eosinophils Relative: 4
Lymphocytes Relative: 28
Monocytes Absolute: 0.4

## 2011-08-15 LAB — POCT CARDIAC MARKERS: Myoglobin, poc: 50.7

## 2011-09-07 ENCOUNTER — Other Ambulatory Visit: Payer: Self-pay | Admitting: Family Medicine

## 2011-09-08 ENCOUNTER — Telehealth: Payer: Self-pay | Admitting: Family Medicine

## 2011-09-08 NOTE — Telephone Encounter (Signed)
Pt requesting refill on Hydrocodone-Acetaminophen 10-660

## 2011-09-11 NOTE — Telephone Encounter (Signed)
Call in #[phone] with 5 rf 

## 2011-09-11 NOTE — Telephone Encounter (Signed)
I called in script 

## 2011-09-12 NOTE — Telephone Encounter (Signed)
done

## 2011-10-13 ENCOUNTER — Other Ambulatory Visit: Payer: Self-pay | Admitting: Family Medicine

## 2011-10-13 NOTE — Telephone Encounter (Signed)
Pt last seen 11/30/10.  Pls advise.

## 2011-10-17 ENCOUNTER — Telehealth: Payer: Self-pay | Admitting: Family Medicine

## 2011-10-17 NOTE — Telephone Encounter (Signed)
Refill request for Lorazepam 1 mg take 1 po tid prn and pt last here on 11/30/10.

## 2011-10-18 ENCOUNTER — Ambulatory Visit (INDEPENDENT_AMBULATORY_CARE_PROVIDER_SITE_OTHER): Payer: Medicare Other | Admitting: Family Medicine

## 2011-10-18 ENCOUNTER — Encounter: Payer: Self-pay | Admitting: Family Medicine

## 2011-10-18 VITALS — BP 100/70 | HR 80 | Temp 98.5°F | Wt 123.0 lb

## 2011-10-18 DIAGNOSIS — R05 Cough: Secondary | ICD-10-CM

## 2011-10-18 DIAGNOSIS — J40 Bronchitis, not specified as acute or chronic: Secondary | ICD-10-CM

## 2011-10-18 MED ORDER — AZITHROMYCIN 250 MG PO TABS: ORAL_TABLET | ORAL | Status: AC

## 2011-10-18 MED ORDER — METHYLPREDNISOLONE ACETATE PF 80 MG/ML IJ SUSP
120.0000 mg | Freq: Once | INTRAMUSCULAR | Status: AC
  Administered 2011-10-18: 120 mg via INTRAMUSCULAR

## 2011-10-18 MED ORDER — HYDROCODONE-ACETAMINOPHEN 10-660 MG PO TABS: 1.0000 | ORAL_TABLET | Freq: Four times a day (QID) | ORAL | Status: DC | PRN

## 2011-10-18 MED ORDER — LORAZEPAM 1 MG PO TABS: 1.0000 mg | ORAL_TABLET | Freq: Three times a day (TID) | ORAL | Status: DC | PRN

## 2011-10-18 NOTE — Progress Notes (Signed)
Addended by: Romualdo Bolk on: 10/18/2011 01:46 PM   Modules accepted: Orders

## 2011-10-18 NOTE — Telephone Encounter (Signed)
Call in #[phone] with 5 rf 

## 2011-10-18 NOTE — Progress Notes (Signed)
  Subjective:    Patient ID: Angelica [name], female    DOB: [birth year], 70 y.o.   MRN: [id number]  HPI Here for one week of chest congestion and coughing up green sputum. No fever.    Review of Systems  Constitutional: Negative.   HENT: Negative.   Eyes: Negative.   Respiratory: Positive for cough and wheezing.        Objective:   Physical Exam  Constitutional: She appears well-developed and well-nourished.  HENT:  Right Ear: External ear normal.  Left Ear: External ear normal.  Nose: Nose normal.  Mouth/Throat: Oropharynx is clear and moist. No oropharyngeal exudate.  Eyes: Conjunctivae are normal.  Neck: No thyromegaly present.  Pulmonary/Chest: Effort normal. She has no wheezes. She has no rales.       Scattered rhonchi  Lymphadenopathy:    She has no cervical adenopathy.          Assessment & Plan:  Add Mucinex

## 2011-10-19 NOTE — Telephone Encounter (Signed)
Script was given to pt on 10/18/11 during office visit.

## 2011-12-08 ENCOUNTER — Telehealth: Payer: Self-pay | Admitting: Family Medicine

## 2011-12-08 MED ORDER — METHOCARBAMOL 750 MG PO TABS: 750.0000 mg | ORAL_TABLET | Freq: Four times a day (QID) | ORAL | Status: DC | PRN

## 2011-12-08 NOTE — Telephone Encounter (Signed)
Script sent e-scribe and left a voice message for pt. 

## 2011-12-08 NOTE — Telephone Encounter (Signed)
Needs new rx for Methocarbamol sent to NEW pharmacy----[facility]---[address]. Patient stated that she is out. Thanks.

## 2012-01-02 ENCOUNTER — Other Ambulatory Visit: Payer: Self-pay | Admitting: Family Medicine

## 2012-01-24 ENCOUNTER — Telehealth: Payer: Self-pay | Admitting: Family Medicine

## 2012-01-24 MED ORDER — FLUCONAZOLE 150 MG PO TABS: 150.0000 mg | ORAL_TABLET | Freq: Once | ORAL | Status: AC

## 2012-01-24 MED ORDER — MAGIC MOUTHWASH: 5.0000 mL | ORAL | Status: DC

## 2012-01-24 NOTE — Telephone Encounter (Signed)
Pt called and said that she has had vomiting, diarrhea, weak since last Wednesday. The vomiting stopped Friday night and the diarrhea stopped yesterday. Pt says that her tongue is coated and sore. Pt is req a refill of Diflucan and Magic Mouthwash, called in to [facility] on [address]. Pt said that she can not come in for ov, because she can't drive and daughter is sick with same thing.

## 2012-01-24 NOTE — Telephone Encounter (Signed)
Scripts sent e-scribe 

## 2012-01-24 NOTE — Telephone Encounter (Signed)
Has not been on antibiotics.  Only symptoms were vomiting and diarrhea x 5 days.  Tongue is sore and coated with white rash.  Pt is requesting Diflucan and [facility] Magic Mouth Wash.

## 2012-01-24 NOTE — Telephone Encounter (Signed)
Call in Magic Mouthwash to swish 5 ml in mouth q 4 hours prn, 300 ml with 5 rf. Also call in Diflucan 150 mg q day, #5 with 5 rf

## 2012-01-24 NOTE — Telephone Encounter (Signed)
Left message for pt to call back with more information.

## 2012-01-26 ENCOUNTER — Other Ambulatory Visit: Payer: Self-pay | Admitting: Family Medicine

## 2012-01-31 ENCOUNTER — Encounter: Payer: Self-pay | Admitting: Family Medicine

## 2012-01-31 ENCOUNTER — Ambulatory Visit (INDEPENDENT_AMBULATORY_CARE_PROVIDER_SITE_OTHER): Payer: Medicare Other | Admitting: Family Medicine

## 2012-01-31 DIAGNOSIS — M109 Gout, unspecified: Secondary | ICD-10-CM

## 2012-01-31 LAB — CBC WITH DIFFERENTIAL/PLATELET
Basophils Absolute: 0 10*3/uL (ref 0.0–0.1)
Eosinophils Absolute: 0.3 10*3/uL (ref 0.0–0.7)
HCT: 34.3 % — ABNORMAL LOW (ref 36.0–46.0)
Lymphs Abs: 2.8 10*3/uL (ref 0.7–4.0)
MCHC: 34.3 g/dL (ref 30.0–36.0)
MCV: 99.4 fl (ref 78.0–100.0)
Monocytes Absolute: 0.5 10*3/uL (ref 0.1–1.0)
Platelets: 257 10*3/uL (ref 150.0–400.0)
RDW: 13.1 % (ref 11.5–14.6)

## 2012-01-31 LAB — URIC ACID: Uric Acid, Serum: 3.5 mg/dL (ref 2.4–7.0)

## 2012-01-31 LAB — BASIC METABOLIC PANEL
BUN: 12 mg/dL (ref 6–23)
CO2: 30 mEq/L (ref 19–32)
GFR: 95.61 mL/min (ref >60.00)
Glucose, Bld: 93 mg/dL (ref 70–99)
Potassium: 4.2 mEq/L (ref 3.5–5.1)

## 2012-01-31 MED ORDER — METHYLPREDNISOLONE ACETATE 80 MG/ML IJ SUSP
120.0000 mg | Freq: Once | INTRAMUSCULAR | Status: AC
  Administered 2012-01-31: 120 mg via INTRAMUSCULAR

## 2012-01-31 MED ORDER — DICLOFENAC SODIUM 1 % TD GEL: 1.0000 "application " | Freq: Four times a day (QID) | TRANSDERMAL | Status: DC | PRN

## 2012-01-31 MED ORDER — ALBUTEROL SULFATE HFA 108 (90 BASE) MCG/ACT IN AERS: 2.0000 | INHALATION_SPRAY | RESPIRATORY_TRACT | Status: DC | PRN

## 2012-01-31 NOTE — Progress Notes (Signed)
Addended by: Aniceto Boss A on: 01/31/2012 04:05 PM   Modules accepted: Orders

## 2012-01-31 NOTE — Progress Notes (Signed)
  Subjective:    Patient ID: Angelica [name], female    DOB: 1941/02/12, 71 y.o.   MRN: [id number]  HPI Here for the sudden onset 5 days ago of severe pain, warmth, and swelling in the left knee. No recent trauma. She has never had this before. She takes Vicodin regularly for degenerative arthritis. Using a walker she had at home.    Review of Systems  Constitutional: Negative.   Musculoskeletal: Positive for joint swelling and arthralgias.       Objective:   Physical Exam  Constitutional: She appears well-developed and well-nourished.  Musculoskeletal:       The left knee is mildly swollen, warm but not red. Tender in both joint spaces. Full ROM.           Assessment & Plan:  This seems to be her first case of gout. Given a steroid shot. Get labs today

## 2012-02-05 ENCOUNTER — Encounter: Payer: Self-pay | Admitting: Family Medicine

## 2012-02-05 NOTE — Progress Notes (Signed)
Quick Note:  I tried to reach pt and no answer. I put a copy of results in mail. ______ 

## 2012-03-06 ENCOUNTER — Telehealth: Payer: Self-pay | Admitting: Family Medicine

## 2012-03-06 MED ORDER — OXYCODONE-ACETAMINOPHEN 10-325 MG PO TABS: 1.0000 | ORAL_TABLET | Freq: Four times a day (QID) | ORAL | Status: AC | PRN

## 2012-03-06 NOTE — Telephone Encounter (Signed)
She is having worse back pain lately, and Vicodin does not help

## 2012-03-06 NOTE — Telephone Encounter (Signed)
[facility] pharmacy called and said that pt rcvd #120 hydrocodone 10-660 mg on 02/22/12 and then rcvd Percocet 10-325 mg on 03/06/12. Pharmacy said that script pt had for Vicodin was dated 10/18/11 and this script was transferred from another pharmacy to [facility].  Pharmacist is concerned pls call.

## 2012-03-06 NOTE — Telephone Encounter (Signed)
Please tell the pharmacy to fill the Percocet. I am aware that she already has Vicodin but her back pain has gotten worse lately

## 2012-03-07 NOTE — Telephone Encounter (Signed)
I spoke with pharmacy 

## 2012-03-11 ENCOUNTER — Encounter: Payer: Self-pay | Admitting: Internal Medicine

## 2012-03-11 ENCOUNTER — Ambulatory Visit: Payer: Medicare Other | Admitting: Family Medicine

## 2012-03-12 ENCOUNTER — Ambulatory Visit: Payer: Medicare Other | Admitting: Family Medicine

## 2012-03-18 ENCOUNTER — Ambulatory Visit (INDEPENDENT_AMBULATORY_CARE_PROVIDER_SITE_OTHER): Payer: Medicare Other | Admitting: Family Medicine

## 2012-03-18 ENCOUNTER — Encounter: Payer: Self-pay | Admitting: Family Medicine

## 2012-03-18 VITALS — BP 106/64 | HR 80 | Temp 98.1°F | Wt 123.0 lb

## 2012-03-18 DIAGNOSIS — M545 Low back pain: Secondary | ICD-10-CM | POA: Diagnosis not present

## 2012-03-18 MED ORDER — OXYCODONE HCL 20 MG PO TABS: 1.0000 | ORAL_TABLET | Freq: Four times a day (QID) | ORAL | Status: DC | PRN

## 2012-03-18 MED ORDER — PROMETHAZINE HCL 50 MG/ML IJ SOLN: 50.0000 mg | Freq: Once | INTRAMUSCULAR | Status: DC

## 2012-03-18 MED ORDER — MEPERIDINE HCL 100 MG/ML IJ SOLN
100.0000 mg | Freq: Once | INTRAMUSCULAR | Status: AC
  Administered 2012-03-18: 100 mg via INTRAMUSCULAR

## 2012-03-18 MED ORDER — PROMETHAZINE HCL 50 MG/ML IJ SOLN
50.0000 mg | Freq: Once | INTRAMUSCULAR | Status: AC
  Administered 2012-03-18: 50 mg via INTRAMUSCULAR

## 2012-03-18 MED ORDER — PREDNISONE (PAK) 10 MG PO TABS: ORAL_TABLET | ORAL | Status: DC

## 2012-03-18 MED ORDER — MEPERIDINE HCL 100 MG/ML IJ SOLN: 100.0000 mg | Freq: Once | INTRAMUSCULAR | Status: DC

## 2012-03-18 NOTE — Progress Notes (Signed)
  Subjective:    Patient ID: Angelica [name], female    DOB: Aug 28, 1941, 71 y.o.   MRN: [id number]  HPI Here with her daughter for 2 weeks of severe low back pain that radiates down both legs. The legs get numb and weak at times. Using Percocet and Robaxin. Her last MRI of lumbar spine was done 2 1/2 years ago by Dr. Venetia Maxon.    Review of Systems  Constitutional: Negative.   Musculoskeletal: Positive for back pain.       Objective:   Physical Exam  Constitutional:       In pain, tearful   Musculoskeletal:       Tender in the lower back with positive SLR bilaterally. Decreased ROM           Assessment & Plan:  Her disc disease seems to have worsened quite a bit. We will change to Oxycodone to use prn along with a prednisone taper pack. Get another MRI scan soon

## 2012-03-18 NOTE — Progress Notes (Signed)
Addended by: Aniceto Boss A on: 03/18/2012 02:16 PM   Modules accepted: Orders

## 2012-03-18 NOTE — Progress Notes (Signed)
Addended by: Aniceto Boss A on: 03/18/2012 02:30 PM   Modules accepted: Orders

## 2012-03-18 NOTE — Progress Notes (Signed)
Addended by: Aniceto Boss A on: 03/18/2012 02:59 PM   Modules accepted: Orders

## 2012-03-19 MED ORDER — OXYCODONE HCL 10 MG PO TABS: 10.0000 mg | ORAL_TABLET | Freq: Four times a day (QID) | ORAL | Status: DC | PRN

## 2012-03-19 MED ORDER — OXYCODONE HCL 10 MG PO TABS: ORAL_TABLET | ORAL | Status: DC

## 2012-03-19 NOTE — Progress Notes (Signed)
Addended by: Aniceto Boss A on: 03/19/2012 08:35 AM   Modules accepted: Orders

## 2012-03-20 ENCOUNTER — Ambulatory Visit
Admission: RE | Admit: 2012-03-20 | Discharge: 2012-03-20 | Disposition: A | Discharge status: Home / Self Care | Payer: Medicare Other | Source: Ambulatory Visit | Attending: Family Medicine | Admitting: Family Medicine

## 2012-03-20 DIAGNOSIS — M545 Low back pain: Secondary | ICD-10-CM

## 2012-03-20 DIAGNOSIS — M5126 Other intervertebral disc displacement, lumbar region: Secondary | ICD-10-CM | POA: Diagnosis not present

## 2012-03-20 DIAGNOSIS — M47817 Spondylosis without myelopathy or radiculopathy, lumbosacral region: Secondary | ICD-10-CM | POA: Diagnosis not present

## 2012-03-20 DIAGNOSIS — M4120 Other idiopathic scoliosis, site unspecified: Secondary | ICD-10-CM | POA: Diagnosis not present

## 2012-03-20 MED ORDER — GADOBENATE DIMEGLUMINE 529 MG/ML IV SOLN
10.0000 mL | Freq: Once | INTRAVENOUS | Status: AC | PRN
  Administered 2012-03-20: 10 mL via INTRAVENOUS

## 2012-03-22 NOTE — Progress Notes (Signed)
Addended by: Gershon Crane A on: 03/22/2012 04:23 PM   Modules accepted: Orders

## 2012-03-25 ENCOUNTER — Telehealth: Payer: Self-pay | Admitting: Speech Pathology

## 2012-03-25 MED ORDER — OXYCODONE HCL 10 MG PO TABS: ORAL_TABLET | ORAL | Status: DC

## 2012-03-25 NOTE — Telephone Encounter (Signed)
done

## 2012-03-25 NOTE — Telephone Encounter (Signed)
Pt called and she is still in severe pain ( back ). Pt was seen on 03/19/12 and she did get Oxycodone 10 mg take 2 every 6 hrs prn #180. The pharmacy did not have the 20 mg and we wrote for #240 of the 10 mg. Pt did not pick up all at that time due to cost. She is having to take 2 tablets every 4 hours to control the pain. Pt requesting another script, she will be out soon.

## 2012-03-25 NOTE — Progress Notes (Signed)
Quick Note:  Spoke with pt ______ 

## 2012-03-25 NOTE — Telephone Encounter (Signed)
Angelica [name] is calling for pt.  She said that the Percocet that was called in was incorrect and pt is going to run out, soon.  Also, no-one has called them with the results of her MRI.  They are asking for a call back ASAP - please!

## 2012-03-25 NOTE — Telephone Encounter (Signed)
Script is ready for pick up and spoke with pt. 

## 2012-03-26 ENCOUNTER — Observation Stay (HOSPITAL_COMMUNITY)
Admission: EM | Admit: 2012-03-26 | Discharge: 2012-03-28 | Disposition: A | Discharge status: Home / Self Care | Payer: Medicare Other | Attending: Internal Medicine | Admitting: Internal Medicine

## 2012-03-26 ENCOUNTER — Encounter (HOSPITAL_COMMUNITY): Payer: Self-pay | Admitting: *Deleted

## 2012-03-26 ENCOUNTER — Inpatient Hospital Stay (HOSPITAL_COMMUNITY): Payer: Medicare Other

## 2012-03-26 DIAGNOSIS — F17200 Nicotine dependence, unspecified, uncomplicated: Secondary | ICD-10-CM | POA: Insufficient documentation

## 2012-03-26 DIAGNOSIS — G4700 Insomnia, unspecified: Secondary | ICD-10-CM | POA: Diagnosis not present

## 2012-03-26 DIAGNOSIS — R6889 Other general symptoms and signs: Secondary | ICD-10-CM | POA: Diagnosis not present

## 2012-03-26 DIAGNOSIS — I10 Essential (primary) hypertension: Secondary | ICD-10-CM | POA: Diagnosis not present

## 2012-03-26 DIAGNOSIS — M4800 Spinal stenosis, site unspecified: Secondary | ICD-10-CM | POA: Insufficient documentation

## 2012-03-26 DIAGNOSIS — K219 Gastro-esophageal reflux disease without esophagitis: Secondary | ICD-10-CM | POA: Insufficient documentation

## 2012-03-26 DIAGNOSIS — M549 Dorsalgia, unspecified: Principal | ICD-10-CM | POA: Insufficient documentation

## 2012-03-26 DIAGNOSIS — J4489 Other specified chronic obstructive pulmonary disease: Secondary | ICD-10-CM | POA: Insufficient documentation

## 2012-03-26 DIAGNOSIS — M545 Low back pain: Secondary | ICD-10-CM | POA: Diagnosis not present

## 2012-03-26 DIAGNOSIS — J449 Chronic obstructive pulmonary disease, unspecified: Secondary | ICD-10-CM | POA: Diagnosis not present

## 2012-03-26 DIAGNOSIS — D72829 Elevated white blood cell count, unspecified: Secondary | ICD-10-CM | POA: Insufficient documentation

## 2012-03-26 DIAGNOSIS — G8929 Other chronic pain: Secondary | ICD-10-CM | POA: Insufficient documentation

## 2012-03-26 DIAGNOSIS — E785 Hyperlipidemia, unspecified: Secondary | ICD-10-CM | POA: Diagnosis not present

## 2012-03-26 DIAGNOSIS — D7289 Other specified disorders of white blood cells: Secondary | ICD-10-CM | POA: Diagnosis not present

## 2012-03-26 DIAGNOSIS — M48061 Spinal stenosis, lumbar region without neurogenic claudication: Secondary | ICD-10-CM | POA: Diagnosis not present

## 2012-03-26 DIAGNOSIS — R52 Pain, unspecified: Secondary | ICD-10-CM | POA: Diagnosis not present

## 2012-03-26 DIAGNOSIS — R918 Other nonspecific abnormal finding of lung field: Secondary | ICD-10-CM | POA: Diagnosis not present

## 2012-03-26 DIAGNOSIS — Z720 Tobacco use: Secondary | ICD-10-CM | POA: Diagnosis present

## 2012-03-26 HISTORY — DX: Other chronic pain: G89.29

## 2012-03-26 HISTORY — DX: Chronic obstructive pulmonary disease, unspecified: J44.9

## 2012-03-26 HISTORY — DX: Encounter for other specified aftercare: Z51.89

## 2012-03-26 HISTORY — DX: Reserved for inherently not codable concepts without codable children: IMO0001

## 2012-03-26 HISTORY — DX: Dorsalgia, unspecified: M54.9

## 2012-03-26 LAB — BASIC METABOLIC PANEL
Chloride: 94 mEq/L — ABNORMAL LOW (ref 96–112)
GFR calc Af Amer: >90 mL/min (ref >90)
Potassium: 3.5 mEq/L (ref 3.5–5.1)
Sodium: 135 mEq/L (ref 135–145)

## 2012-03-26 LAB — CBC
MCHC: 33.9 g/dL (ref 30.0–36.0)
RDW: 12 % (ref 11.5–15.5)

## 2012-03-26 LAB — DIFFERENTIAL
Basophils Absolute: 0 10*3/uL (ref 0.0–0.1)
Basophils Relative: 0 % (ref 0–1)
Neutro Abs: 11.7 10*3/uL — ABNORMAL HIGH (ref 1.7–7.7)
Neutrophils Relative %: 83 % — ABNORMAL HIGH (ref 43–77)

## 2012-03-26 MED ORDER — ADULT MULTIVITAMIN W/MINERALS CH
1.0000 | ORAL_TABLET | Freq: Every day | ORAL | Status: DC
  Administered 2012-03-27 – 2012-03-28 (×2): 1 via ORAL
  Filled 2012-03-26 (×2): qty 1

## 2012-03-26 MED ORDER — HYDROMORPHONE HCL PF 1 MG/ML IJ SOLN
1.0000 mg | Freq: Once | INTRAMUSCULAR | Status: AC
  Administered 2012-03-26: 1 mg via INTRAVENOUS
  Filled 2012-03-26: qty 1

## 2012-03-26 MED ORDER — ATENOLOL 25 MG PO TABS
25.0000 mg | ORAL_TABLET | Freq: Every day | ORAL | Status: DC
  Administered 2012-03-28: 25 mg via ORAL
  Filled 2012-03-26 (×3): qty 1

## 2012-03-26 MED ORDER — KETOROLAC TROMETHAMINE 30 MG/ML IJ SOLN
15.0000 mg | Freq: Once | INTRAMUSCULAR | Status: AC
  Administered 2012-03-26: 15 mg via INTRAVENOUS
  Filled 2012-03-26: qty 1

## 2012-03-26 MED ORDER — SERTRALINE HCL 100 MG PO TABS
100.0000 mg | ORAL_TABLET | Freq: Every day | ORAL | Status: DC
  Administered 2012-03-26 – 2012-03-28 (×3): 100 mg via ORAL
  Filled 2012-03-26 (×3): qty 1

## 2012-03-26 MED ORDER — MORPHINE SULFATE 2 MG/ML IJ SOLN
2.0000 mg | INTRAMUSCULAR | Status: DC | PRN
  Administered 2012-03-27 – 2012-03-28 (×8): 2 mg via INTRAVENOUS
  Filled 2012-03-26 (×8): qty 1

## 2012-03-26 MED ORDER — ACETAMINOPHEN 650 MG RE SUPP: 650.0000 mg | Freq: Four times a day (QID) | RECTAL | Status: DC | PRN

## 2012-03-26 MED ORDER — ASPIRIN EC 81 MG PO TBEC
81.0000 mg | DELAYED_RELEASE_TABLET | Freq: Every day | ORAL | Status: DC
  Administered 2012-03-27 – 2012-03-28 (×2): 81 mg via ORAL
  Filled 2012-03-26 (×2): qty 1

## 2012-03-26 MED ORDER — OXYCODONE HCL 5 MG PO TABS
10.0000 mg | ORAL_TABLET | Freq: Four times a day (QID) | ORAL | Status: DC | PRN
  Administered 2012-03-26 – 2012-03-28 (×6): 10 mg via ORAL
  Filled 2012-03-26 (×7): qty 2

## 2012-03-26 MED ORDER — NICOTINE 14 MG/24HR TD PT24
14.0000 mg | MEDICATED_PATCH | Freq: Every day | TRANSDERMAL | Status: DC
  Administered 2012-03-26 – 2012-03-28 (×3): 14 mg via TRANSDERMAL
  Filled 2012-03-26 (×3): qty 1

## 2012-03-26 MED ORDER — METHOCARBAMOL 500 MG PO TABS
750.0000 mg | ORAL_TABLET | Freq: Three times a day (TID) | ORAL | Status: DC | PRN
  Administered 2012-03-27 – 2012-03-28 (×5): 750 mg via ORAL
  Filled 2012-03-26 (×5): qty 2

## 2012-03-26 MED ORDER — LORAZEPAM 2 MG/ML IJ SOLN
1.0000 mg | Freq: Once | INTRAMUSCULAR | Status: AC
  Administered 2012-03-26: 1 mg via INTRAVENOUS
  Filled 2012-03-26: qty 1

## 2012-03-26 MED ORDER — POTASSIUM CHLORIDE 20 MEQ/15ML (10%) PO LIQD
40.0000 meq | Freq: Once | ORAL | Status: AC
  Administered 2012-03-26: 40 meq via ORAL
  Filled 2012-03-26: qty 30

## 2012-03-26 MED ORDER — ACETAMINOPHEN 325 MG PO TABS
650.0000 mg | ORAL_TABLET | Freq: Four times a day (QID) | ORAL | Status: DC | PRN
  Administered 2012-03-28: 650 mg via ORAL
  Filled 2012-03-26: qty 2

## 2012-03-26 MED ORDER — LORAZEPAM 1 MG PO TABS
1.0000 mg | ORAL_TABLET | Freq: Three times a day (TID) | ORAL | Status: DC | PRN
  Administered 2012-03-28: 1 mg via ORAL
  Filled 2012-03-26: qty 1

## 2012-03-26 MED ORDER — ALBUTEROL SULFATE HFA 108 (90 BASE) MCG/ACT IN AERS: 2.0000 | INHALATION_SPRAY | RESPIRATORY_TRACT | Status: DC | PRN

## 2012-03-26 MED ORDER — ONE-DAILY MULTI VITAMINS PO TABS: 1.0000 | ORAL_TABLET | Freq: Every day | ORAL | Status: DC

## 2012-03-26 MED ORDER — SODIUM CHLORIDE 0.9 % IV SOLN
INTRAVENOUS | Status: DC
  Administered 2012-03-27: 01:00:00 via INTRAVENOUS

## 2012-03-26 MED ORDER — ASPIRIN 81 MG PO TBEC: 81.0000 mg | DELAYED_RELEASE_TABLET | Freq: Every day | ORAL | Status: DC

## 2012-03-26 MED ORDER — DICLOFENAC SODIUM 1 % TD GEL: 1.0000 "application " | Freq: Four times a day (QID) | TRANSDERMAL | Status: DC | PRN

## 2012-03-26 MED ORDER — SODIUM CHLORIDE 0.9 % IV SOLN
Freq: Once | INTRAVENOUS | Status: AC
  Administered 2012-03-26: 1000 mL via INTRAVENOUS

## 2012-03-26 MED ORDER — DEXAMETHASONE SODIUM PHOSPHATE 10 MG/ML IJ SOLN
10.0000 mg | Freq: Once | INTRAMUSCULAR | Status: AC
  Administered 2012-03-26: 10 mg via INTRAVENOUS
  Filled 2012-03-26: qty 1

## 2012-03-26 MED ORDER — LORATADINE 10 MG PO TABS
10.0000 mg | ORAL_TABLET | Freq: Every day | ORAL | Status: DC
  Administered 2012-03-27 – 2012-03-28 (×2): 10 mg via ORAL
  Filled 2012-03-26 (×2): qty 1

## 2012-03-26 MED ORDER — FAMOTIDINE 20 MG PO TABS
20.0000 mg | ORAL_TABLET | Freq: Every day | ORAL | Status: DC
  Administered 2012-03-27 – 2012-03-28 (×2): 20 mg via ORAL
  Filled 2012-03-26 (×2): qty 1

## 2012-03-26 NOTE — ED Notes (Signed)
Per EMS. Pt is from home. Pt has history of chronic back pain with arthritis and slipped disc. Pt has MRI x1 week ago. Pt will have neurosurgery consult, but pt can not wait for consult and pain has become intolerable despite pain medication- percocet. Pt reports pain worse with movement.  Pt family wants the patient evaluated to expedite neurosurgery consult and improve pain control.

## 2012-03-26 NOTE — ED Provider Notes (Signed)
History     CSN: [id number]  Arrival date & time 03/26/12  1044   First MD Initiated Contact with Patient 03/26/12 1107      Chief Complaint  Patient presents with  . Back Pain    (Consider location/radiation/quality/duration/timing/severity/associated sxs/prior treatment) Patient is a 71 y.o. female presenting with back pain. The history is provided by the patient.  Back Pain  This is a chronic problem. The current episode started more than 1 week ago. The problem occurs constantly. The problem has been rapidly worsening. The pain is associated with no known injury. The pain is present in the lumbar spine. The quality of the pain is described as shooting and stabbing. The pain is severe. The symptoms are aggravated by certain positions and twisting. The pain is the same all the time. Associated symptoms include tingling. Pertinent negatives include no fever, no numbness, no bowel incontinence, no perianal numbness, no bladder incontinence, no paresis and no weakness. She has tried analgesics for the symptoms. The treatment provided no relief.  h/o chronic back pain, had an mri on 5/8 that showed L4-5 spinal stenosis --scheduled to see N/S in 2 weeks--using robaxin and oxycodone 10mg  without reilef  Past Medical History  Diagnosis Date  . Osteoarthritis   . Allergic rhinitis   . HLD (hyperlipidemia)   . HTN (hypertension)   . GERD (gastroesophageal reflux disease)   . Diverticulosis     hx  . Gallstones   . PUD (peptic ulcer disease)     duodenal 1980s  . Hiatal hernia   . Chronic back pain   . COPD (chronic obstructive pulmonary disease)     Past Surgical History  Procedure Date  . Lumbar disc surgery 1986  . Cervical laminectomy 8/07    Dr. Venetia Maxon  . L shoulder surgery 2007    Dr. Teressa Senter    Family History  Problem Relation Age of Onset  . Colon cancer Neg Hx     History  Substance Use Topics  . Smoking status: Current Everyday Smoker -- 1.0 packs/day    Types:  Cigarettes  . Smokeless tobacco: Never Used   Comment: 1 ppd   . Alcohol Use: No    OB History    Grav Para Term Preterm Abortions TAB SAB Ect Mult Living                  Review of Systems  Constitutional: Negative for fever.  Gastrointestinal: Negative for bowel incontinence.  Genitourinary: Negative for bladder incontinence.  Musculoskeletal: Positive for back pain.  Neurological: Positive for tingling. Negative for weakness and numbness.  All other systems reviewed and are negative.    Allergies  Codeine and Penicillins  Home Medications   Current Outpatient Rx  Name Route Sig Dispense Refill  . ALBUTEROL SULFATE HFA 108 (90 BASE) MCG/ACT IN AERS Inhalation Inhale 2 puffs into the lungs every 4 (four) hours as needed for wheezing or shortness of breath. 1 Inhaler 11  . MAGIC MOUTHWASH Oral Take 5 mLs by mouth every 4 (four) hours. 300 mL 5  . ASPIRIN 81 MG PO TBEC Oral Take 81 mg by mouth daily.      . ATENOLOL 25 MG PO TABS  TAKE ONE TABLET BY MOUTH EVERY DAY 30 tablet 11  . CETIRIZINE-PSEUDOEPHEDRINE ER 5-120 MG PO TB12 Oral Take 1 tablet by mouth 2 (two) times daily.    Marland Kitchen VITAMIN D3 2000 UNITS PO TABS Oral Take by mouth daily.    Marland Kitchen  DICLOFENAC SODIUM 1 % TD GEL Topical Apply 1 application topically every 6 (six) hours as needed (knee pain ). 100 g 11  . LORAZEPAM 1 MG PO TABS Oral Take 1 tablet (1 mg total) by mouth every 8 (eight) hours as needed for anxiety. 90 tablet 5  . METHOCARBAMOL 750 MG PO TABS Oral Take 1 tablet (750 mg total) by mouth 4 (four) times daily as needed. 120 tablet 11  . ONE-DAILY MULTI VITAMINS PO TABS Oral Take 1 tablet by mouth daily.    . OXYCODONE HCL 10 MG PO TABS  Take 2 tablets every 6 hours as needed. 180 tablet 0  . PROMETHAZINE HCL 50 MG/ML IJ SOLN Intramuscular Inject 1 mL (50 mg total) into the muscle once. 1 mL 0  . RANITIDINE HCL 150 MG PO CAPS Oral Take 150 mg by mouth as needed.     . SERTRALINE HCL 100 MG PO TABS  TAKE ONE  TABLET BY MOUTH EVERY DAY 30 tablet 6    BP 125/59  Pulse 108  Temp(Src) 98.7 F (37.1 C) (Oral)  Resp 15  SpO2 95%  Physical Exam  Nursing note and vitals reviewed. Constitutional: She is oriented to person, place, and time. Vital signs are normal. She appears well-developed and well-nourished.  Non-toxic appearance. No distress.  HENT:  Head: Normocephalic and atraumatic.  Eyes: Conjunctivae, EOM and lids are normal. Pupils are equal, round, and reactive to light.  Neck: Normal range of motion. Neck supple. No tracheal deviation present. No mass present.  Cardiovascular: Regular rhythm and normal heart sounds.  Tachycardia present.  Exam reveals no gallop.   No murmur heard. Pulmonary/Chest: Effort normal and breath sounds normal. No stridor. No respiratory distress. She has no decreased breath sounds. She has no wheezes. She has no rhonchi. She has no rales.  Abdominal: Soft. Normal appearance and bowel sounds are normal. She exhibits no distension. There is no tenderness. There is no rebound and no CVA tenderness.  Musculoskeletal: Normal range of motion. She exhibits no edema and no tenderness.  Neurological: She is alert and oriented to person, place, and time. She has normal strength. No cranial nerve deficit or sensory deficit. GCS eye subscore is 4. GCS verbal subscore is 5. GCS motor subscore is 6.  Skin: Skin is warm and dry. No abrasion and no rash noted.  Psychiatric: She has a normal mood and affect. Her speech is normal and behavior is normal.    ED Course  Procedures (including critical care time)  Labs Reviewed - No data to display No results found.   No diagnosis found.    MDM  Pt given pain meds and still in severe pain, spoke with dr. Venetia Maxon, will see her at [facility] in conjunction with [facility] hospitalist--pt able to walk but with sever pain        Toy Baker, MD 03/26/12 1454

## 2012-03-26 NOTE — ED Notes (Signed)
UJW:[id number]<YN> Expected date:03/26/12<BR> Expected time:10:24 AM<BR> Means of arrival:Ambulance<BR> Comments:<BR> EMS

## 2012-03-26 NOTE — ED Notes (Signed)
[facility] called for transport. 

## 2012-03-26 NOTE — H&P (Signed)
PCP:   Nelwyn Salisbury, MD, MD   Chief Complaint:  Worsening back pain over last 2 weeks.  HPI: Ms Ruehl was transferred from [facility] ED for worsening back pain over the last 2 weeks. She is sedated at the time of my evaluation(from meds given in ED) and hence gives limited but helpful history. Her son at bed side helps with filling in the gaps. 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 She has slept less than 2 hours per night per her report, due to the pain. The pain is in the lower back and costophrenic angles, and radiates to both legs. Ms Deshazer denies any fall recently, or dysuria, or fever. She had an MRI of the lumbar spine on 03/20/2012, with no dramatic findings. She was supposed to see Dr Venetia Maxon in the office but she ended up in the ED due to the intractable pain. Pain meds in ED helped some but she is already asking for more meds. Dr Venetia Maxon was consulted by the ED and he requesetd for patient to be followed here at [facility] hence transfer.  Review of Systems:  The patient denies anorexia, fever, weight loss,, vision loss, decreased hearing, hoarseness, chest pain, syncope, dyspnea on exertion, peripheral edema, balance deficits, hemoptysis, abdominal pain, melena, hematochezia, severe indigestion/heartburn, hematuria, incontinence, genital sores, muscle weakness, suspicious skin lesions, transient blindness, difficulty walking, depression, unusual weight change, abnormal bleeding, enlarged lymph nodes, angioedema, and breast masses.  Past Medical History: Past Medical History  Diagnosis Date  . Osteoarthritis   . Allergic rhinitis   . HLD (hyperlipidemia)   . HTN (hypertension)   . GERD (gastroesophageal reflux disease)   . Diverticulosis     hx  . Gallstones   . PUD (peptic ulcer disease)     duodenal 1980s  . Hiatal hernia   . Chronic  back pain   . COPD (chronic obstructive pulmonary disease)   . Blood transfusion    Past Surgical History  Procedure Date  . Lumbar disc surgery 1986  . Cervical laminectomy 8/07    Dr. Venetia Maxon  . L shoulder surgery 2007    Dr. Teressa Senter    Medications: Prior to Admission medications   Medication Sig Start Date End Date Taking? Authorizing Provider  albuterol (PROVENTIL HFA) 108 (90 BASE) MCG/ACT inhaler Inhale 2 puffs into the lungs every 4 (four) hours as needed for wheezing or shortness of breath. 01/31/12  Yes Nelwyn Salisbury, MD  Alum & Mag Hydroxide-Simeth (MAGIC MOUTHWASH) SOLN Take 5 mLs by mouth every 4 (four) hours. 01/24/12  Yes Nelwyn Salisbury, MD  aspirin 81 MG EC tablet Take 81 mg by mouth daily.     Yes Historical Provider, MD  atenolol (TENORMIN) 25 MG tablet TAKE ONE TABLET BY MOUTH EVERY DAY 01/26/12  Yes Nelwyn Salisbury, MD  cetirizine-pseudoephedrine (ZYRTEC-D) 5-120 MG per tablet Take 1 tablet by mouth 2 (two) times daily.   Yes Historical Provider, MD  Cholecalciferol (VITAMIN D3) 2000 UNITS TABS Take by mouth daily.   Yes Historical Provider, MD  diclofenac sodium (VOLTAREN) 1 % GEL Apply 1 application topically every 6 (six) hours as needed (knee pain ). 01/31/12  Yes Nelwyn Salisbury, MD  LORazepam (ATIVAN) 1 MG tablet Take 1 tablet (1 mg total) by mouth every 8 (eight) hours as needed for anxiety. 10/18/11  Yes  Nelwyn Salisbury, MD  methocarbamol (ROBAXIN) 750 MG tablet Take 1 tablet (750 mg total) by mouth 4 (four) times daily as needed. 12/08/11  Yes Nelwyn Salisbury, MD  Multiple Vitamin (MULTIVITAMIN) tablet Take 1 tablet by mouth daily.   Yes Historical Provider, MD  Oxycodone HCl 10 MG TABS Take 2 tablets every 6 hours as needed. 03/25/12  Yes Nelwyn Salisbury, MD  promethazine (PHENERGAN) 50 MG/ML injection Inject 1 mL (50 mg total) into the muscle once. 03/18/12 03/18/13 Yes Nelwyn Salisbury, MD  ranitidine (ZANTAC) 150 MG capsule Take 150 mg by mouth as needed.    Yes Historical Provider,  MD  sertraline (ZOLOFT) 100 MG tablet TAKE ONE TABLET BY MOUTH EVERY DAY 01/02/12  Yes Nelwyn Salisbury, MD    Allergies:   Allergies  Allergen Reactions  . Codeine   . Penicillins     Social History:  reports that she has been smoking Cigarettes.  She has been smoking about 1 pack per day. She has never used smokeless tobacco. She reports that she does not drink alcohol or use illicit drugs.  Family History: Family History  Problem Relation Age of Onset  . Colon cancer Neg Hx     Physical Exam: Filed Vitals:   03/26/12 1049 03/26/12 1345 03/26/12 1558 03/26/12 1659  BP: 125/59 105/53 92/54 119/66  Pulse: 108 92 87 87  Temp: 98.7 F (37.1 C) 98.6 F (37 C)  97.9 F (36.6 C)  TempSrc: Oral Oral    Resp: 15 17 18 16   Height:    5\' 3"  (1.6 m)  Weight:    55.792 kg (123 lb)  SpO2: 95% 92% 93% 91%   Somnolent. Not in acute distress. PERRLA. No JVD. Good air entry bilaterally. Basilar rhonchi bilaterally. No wheezing. S1S2 heard. No murmurs. RRR. Abdomen- soft, non tender. +BS. CNS- grossly Intact. Extremities/trunk- point tenderness lumbosacral region. No pedal edema. Good peripheral pulses.   Labs on Admission:   [facility] 03/26/12 1145  NA 135  K 3.5  CL 94*  CO2 25  GLUCOSE 121*  BUN 13  CREATININE 0.53  CALCIUM 9.8  MG --  PHOS --   No results found for this basename: AST:2,ALT:2,ALKPHOS:2,BILITOT:2,PROT:2,ALBUMIN:2 in the last 72 hours No results found for this basename: LIPASE:2,AMYLASE:2 in the last 72 hours  Basename 03/26/12 1145  WBC 14.1*  NEUTROABS 11.7*  HGB 11.7*  HCT 34.5*  MCV 97.5  PLT 347   No results found for this basename: CKTOTAL:3,CKMB:3,CKMBINDEX:3,TROPONINI:3 in the last 72 hours No results found for this basename: TSH,T4TOTAL,FREET3,T3FREE,THYROIDAB in the last 72 hours No results found for this basename: VITAMINB12:2,FOLATE:2,FERRITIN:2,TIBC:2,IRON:2,RETICCTPCT:2 in the last 72 hours  Radiological Exams on Admission: Mr Lumbar  Spine W Wo Contrast  03/21/2012  **ADDENDUM** CREATED: 03/21/2012 08:20:14  BUN and creatinine were obtained on site at [facility] at [address] Results:  BUN not available mg/dL,  Creatinine 0.4 mg/dL.  **END ADDENDUM** SIGNED BY: Dineen Kid. Chestine Spore, M.D.    03/20/2012  *RADIOLOGY REPORT*  Clinical Data: Severe low back pain with bilateral leg pain. Remote history of back surgery.  MRI LUMBAR SPINE WITHOUT AND WITH CONTRAST  Technique:  Multiplanar and multiecho pulse sequences of the lumbar spine were obtained without and with intravenous contrast.  Contrast: 10mL MULTIHANCE GADOBENATE DIMEGLUMINE 529 MG/ML IV SOLN  Comparison: Lumbar MRI 05/13/2009  Findings: Dextroscoliosis in the lumbar spine.  Normal sagittal alignment.  Negative for fracture or mass.  No enhancing lesions are  identified.  Conus medullaris is normal.  L1-2:  Disc degeneration with disc space narrowing mild spurring, similar to the prior study.  Mild facet degeneration without significant spinal stenosis.  L2-3:  Disc degeneration with disc bulging and mild spurring on the left.  Mild facet degeneration.  Mild narrowing of the left lateral recess, unchanged from the prior study.  No focal disc protrusion.  L3-4:  Disc degeneration with disc space narrowing.  Disc bulging and mild spurring.  Mild facet degeneration bilaterally with mild spinal stenosis.  This is similar to the prior study.  L4-5:  Disc bulging and vertebral spurring.  Moderate facet and ligamentum flavum hypertrophy.  Progression of facet degeneration since the prior MRI.  Mild to moderate spinal stenosis with slight worsening from the prior study.  Right foraminal encroachment is present with some progression from the  prior study.  L5-S1:  Prior laminectomy on the left.  No recurrent disc protrusion.  Disc degeneration and spurring are present.  This is similar to the prior study.  IMPRESSION: Scoliosis and degenerative changes as above.  Postop laminectomy on the  left L5-S1 without recurrent disc protrusion.  Mild to moderate spinal stenosis at L4-5, with mild progression from the prior study.  Original Report Authenticated By: Camelia Phenes, M.D.    Assessment Intractable, worsening back pain in elderly female with DJD, mild to moderate spinal stenosis. Wonder if another process going on, ?UTI etc. Plan .Back pain, subacute- admit med-surg. Pain meds/Physical therapy. Check for UTI. Dr Venetia Maxon to follow. .Leukocytosis- ?cause. Cxr/UA/UC. Repeat CBC in am. Will not start abx, unl;ess clear evidence of infection. Marland KitchenSpinal stenosis- defer to Dr Venetia Maxon. .Tobacco abuse- smoking cessation counseling given. Nicotine patch for now. .Insomnia- care with hypnotics. Dvt/gi prophylaxis.    Cheryl Stabenow [phone]. 03/26/2012, 7:04 PM

## 2012-03-27 DIAGNOSIS — M4800 Spinal stenosis, site unspecified: Secondary | ICD-10-CM | POA: Diagnosis not present

## 2012-03-27 DIAGNOSIS — M545 Low back pain: Secondary | ICD-10-CM | POA: Diagnosis not present

## 2012-03-27 DIAGNOSIS — D7289 Other specified disorders of white blood cells: Secondary | ICD-10-CM | POA: Diagnosis not present

## 2012-03-27 DIAGNOSIS — K219 Gastro-esophageal reflux disease without esophagitis: Secondary | ICD-10-CM | POA: Diagnosis not present

## 2012-03-27 LAB — URINALYSIS, ROUTINE W REFLEX MICROSCOPIC
Glucose, UA: NEGATIVE mg/dL
Hgb urine dipstick: NEGATIVE
Protein, ur: NEGATIVE mg/dL

## 2012-03-27 LAB — TSH: TSH: 0.255 u[IU]/mL — ABNORMAL LOW (ref 0.350–4.500)

## 2012-03-27 LAB — APTT: aPTT: 36 seconds (ref 24–37)

## 2012-03-27 LAB — MAGNESIUM: Magnesium: 2.1 mg/dL (ref 1.5–2.5)

## 2012-03-27 NOTE — Progress Notes (Signed)
UR COMPLETED  

## 2012-03-27 NOTE — Evaluation (Signed)
Physical Therapy Evaluation Patient Details Name: Angelica [name] MRN: [id number] DOB: 04/30/1941 Today's Date: 03/27/2012 Time: 4098-1191 PT Time Calculation (min): 30 min  PT Assessment / Plan / Recommendation Clinical Impression  pt admitted with intractible back pain that continues at eval, but with less intensity, though no causing pt to be tremulous during gait. Pt also experiencing weakness and decr activity tolerance.  Await further word on potential causes and interventions.  Rec. HHPT at this time.    PT Assessment  Patient needs continued PT services    Follow Up Recommendations  Home health PT    Barriers to Discharge        lEquipment Recommendations  Defer to next venue    Recommendations for Other Services     Frequency Min 3X/week    Precautions / Restrictions Precautions Precautions: Fall Restrictions Weight Bearing Restrictions: No   Pertinent Vitals/Pain       Mobility  Bed Mobility Bed Mobility: Rolling Left;Left Sidelying to Sit Rolling Left: 4: Min assist;With rail (progressed to min guard without rail]) Left Sidelying to Sit: 4: Min assist;Other (comment);With rails (progressed to min guard without rail) Details for Bed Mobility Assistance: verbal/visual cues for safer technique (log roll, side to sit);  Transfers Transfers: Sit to Stand;Stand to Sit Sit to Stand: 4: Min assist;With upper extremity assist;From bed Stand to Sit: 4: Min assist;With upper extremity assist;To chair/3-in-1 Details for Transfer Assistance: vc's for hand placement; steadying assist Ambulation/Gait Ambulation/Gait Assistance: 4: Min assist Ambulation Distance (Feet): 24 Feet Assistive device: Rolling walker;1 person hand held assist Ambulation/Gait Assistance Details: too shaky for HHA, more stable gait with RW; short, shakey steps Gait Pattern: Step-through pattern;Decreased step length - right;Decreased step length - left;Decreased stride length Stairs:  No Wheelchair Mobility Wheelchair Mobility: No    Exercises     PT Diagnosis: Difficulty walking;Generalized weakness;Acute pain  PT Problem List: Decreased strength;Decreased activity tolerance;Decreased mobility;Decreased knowledge of use of DME;Pain PT Treatment Interventions: DME instruction;Gait training;Stair training;Functional mobility training;Therapeutic activities;Balance training;Patient/family education   PT Goals Acute Rehab PT Goals PT Goal Formulation: With patient Time For Goal Achievement: 04/03/12 Potential to Achieve Goals: Good Pt will go Supine/Side to Sit: with modified independence PT Goal: Supine/Side to Sit - Progress: Goal set today Pt will go Sit to Stand: with modified independence PT Goal: Sit to Stand - Progress: Goal set today Pt will Transfer Bed to Chair/Chair to Bed: with modified independence PT Transfer Goal: Bed to Chair/Chair to Bed - Progress: Goal set today Pt will Ambulate: >150 feet;51 - 150 feet;with supervision;with least restrictive assistive device PT Goal: Ambulate - Progress: Goal set today Pt will Go Up / Down Stairs: 3-5 stairs;with supervision;with least restrictive assistive device PT Goal: Up/Down Stairs - Progress: Goal set today  Visit Information  Last PT Received On: 03/27/12 Assistance Needed: +1    Subjective Data  Subjective: I want to practice it now (bed mob/log roll) so I don't do it wrong. Patient Stated Goal: Independent at home.   Prior Functioning  Home Living Lives With: Alone (daughter close by) Available Help at Discharge: Family (dtr has spent last 2 weeks with her MOm) Type of Home: House Home Access: Stairs to enter [employer] of Steps: 2 Entrance Stairs-Rails:  (post) Home Layout: Two level;Laundry or work area in basement;Able to live on main level with bedroom/bathroom Alternate Teacher, music of Steps: 12 Alternate Level Stairs-Rails: Left;Right Bathroom Shower/Tub: Tub/shower  unit;Walk-in shower Bathroom Toilet: Handicapped height Bathroom Accessibility: Yes Home  Adaptive Equipment: Walker - rolling Prior Function Level of Independence: Independent Able to Take Stairs?: Yes Driving: Yes Communication Communication: No difficulties    Cognition  Overall Cognitive Status: Appears within functional limits for tasks assessed/performed Arousal/Alertness: Awake/alert Orientation Level: Appears intact for tasks assessed Behavior During Session: [facility] for tasks performed    Extremity/Trunk Assessment Right Upper Extremity Assessment RUE ROM/Strength/Tone: [facility] for tasks assessed Left Upper Extremity Assessment LUE ROM/Strength/Tone: [facility] for tasks assessed Right Lower Extremity Assessment RLE ROM/Strength/Tone: [facility] for tasks assessed Left Lower Extremity Assessment LLE ROM/Strength/Tone: [facility] for tasks assessed Trunk Assessment Trunk Assessment: Normal   Balance Balance Balance Assessed: No  End of Session PT - End of Session Activity Tolerance: Patient tolerated treatment well Patient left: in bed;with call bell/phone within reach;with family/visitor present Nurse Communication: Mobility status   Sandra Brents, Eliseo Gum 03/27/2012, 2:44 PM  03/27/2012  McCrory Bing, PT [phone] (pager)

## 2012-03-27 NOTE — Progress Notes (Signed)
Subjective: Back pain has moved from low back down both legs to upper back No SOB No CP   Objective: Vital signs in last 24 hours: Filed Vitals:   03/26/12 1558 03/26/12 1659 03/26/12 2048 03/27/12 0542  BP: 92/54 119/66 106/67 98/61  Pulse: 87 87 77 82  Temp:  97.9 F (36.6 C) 97.6 F (36.4 C) 98 F (36.7 C)  TempSrc:   Oral Oral  Resp: 18 16 18 16   Height:  5\' 3"  (1.6 m)    Weight:  55.792 kg (123 lb)    SpO2: 93% 91% 97% 95%   Weight change:   Intake/Output Summary (Last 24 hours) at 03/27/12 0905 Last data filed at 03/27/12 0742  Gross per 24 hour  Intake      0 ml  Output    351 ml  Net   -351 ml    Physical Exam: General: Awake, Oriented, No acute distress. HEENT: EOMI. Neck: Supple CV: S1 and S2, rrr Lungs: Clear to ascultation bilaterally, no wheezing Abdomen: Soft, Nontender, Nondistended, +bowel sounds. Ext: Good pulses. Trace edema.   Lab Results:  Basename 03/27/12 0545 03/26/12 1145  NA -- 135  K -- 3.5  CL -- 94*  CO2 -- 25  GLUCOSE -- 121*  BUN -- 13  CREATININE -- 0.53  CALCIUM -- 9.8  MG 2.1 --  PHOS 3.7 --   No results found for this basename: AST:2,ALT:2,ALKPHOS:2,BILITOT:2,PROT:2,ALBUMIN:2 in the last 72 hours No results found for this basename: LIPASE:2,AMYLASE:2 in the last 72 hours  Basename 03/26/12 1145  WBC 14.1*  NEUTROABS 11.7*  HGB 11.7*  HCT 34.5*  MCV 97.5  PLT 347   No results found for this basename: CKTOTAL:3,CKMB:3,CKMBINDEX:3,TROPONINI:3 in the last 72 hours No components found with this basename: POCBNP:3 No results found for this basename: DDIMER:2 in the last 72 hours No results found for this basename: HGBA1C:2 in the last 72 hours No results found for this basename: CHOL:2,HDL:2,LDLCALC:2,TRIG:2,CHOLHDL:2,LDLDIRECT:2 in the last 72 hours No results found for this basename: TSH,T4TOTAL,FREET3,T3FREE,THYROIDAB in the last 72 hours  Basename 03/26/12 2058  VITAMINB12 244  FOLATE --  FERRITIN --  TIBC  --  IRON --  RETICCTPCT --    Micro Results: No results found for this or any previous visit (from the past 240 hour(s)).  Studies/Results: Portable Chest 1 View  03/26/2012  *RADIOLOGY REPORT*  Clinical Data: Leukocytosis  PORTABLE CHEST - 1 VIEW  Comparison: 01/09/2011  Findings: Slightly shallow inspiration with elevation of the left hemidiaphragm.  Normal heart size and pulmonary vascularity. Tortuous aorta.  Probable emphysematous changes in the upper lungs. No focal airspace consolidation.  No blunting of costophrenic angles.  No pneumothorax.  No significant change since previous study.  IMPRESSION: Emphysematous changes in the lungs.  No evidence of active pulmonary disease.  Original Report Authenticated By: Marlon Pel, M.D.    Medications: I have reviewed the patient's current medications. Scheduled Meds:   . sodium chloride   Intravenous Once  . aspirin EC  81 mg Oral Daily  . atenolol  25 mg Oral Daily  . dexamethasone  10 mg Intravenous Once  . famotidine  20 mg Oral Daily  . HYDROmorphone  1 mg Intravenous Once  . ketorolac  15 mg Intravenous Once  . loratadine  10 mg Oral Daily  . LORazepam  1 mg Intravenous Once  . mulitivitamin with minerals  1 tablet Oral Daily  . nicotine  14 mg Transdermal Daily  . potassium chloride  40 mEq Oral Once  . sertraline  100 mg Oral Daily  . DISCONTD: aspirin  81 mg Oral Daily  . DISCONTD: multivitamin  1 tablet Oral Daily   Continuous Infusions:   . sodium chloride 50 mL/hr at 03/27/12 0052   PRN Meds:.acetaminophen, acetaminophen, albuterol, diclofenac sodium, LORazepam, methocarbamol, morphine injection, oxyCODONE  Assessment/Plan: Intractable, worsening back pain in elderly female with DJD, mild to moderate spinal stenosis. Wonder if another process going on, ?UTI etc.   .Back pain, subacute- admit med-surg. Pain meds/Physical therapy. Dr Venetia Maxon to follow. .Leukocytosis- ?cause.  Repeat CBC in am. Will not start  abx, unless clear evidence of infection- chest x ray negative, U/A negative .Spinal stenosis- defer to Dr Venetia Maxon. .Tobacco abuse- smoking cessation counseling given. Nicotine patch for now. .Insomnia- care with hypnotics.  Dvt/gi prophylaxis.     LOS: 1 day  Ruel Dimmick, DO 03/27/2012, 9:05 AM

## 2012-03-28 ENCOUNTER — Observation Stay (HOSPITAL_COMMUNITY): Payer: Medicare Other

## 2012-03-28 DIAGNOSIS — M545 Low back pain: Secondary | ICD-10-CM | POA: Diagnosis not present

## 2012-03-28 DIAGNOSIS — M5137 Other intervertebral disc degeneration, lumbosacral region: Secondary | ICD-10-CM | POA: Diagnosis not present

## 2012-03-28 DIAGNOSIS — M25559 Pain in unspecified hip: Secondary | ICD-10-CM | POA: Diagnosis not present

## 2012-03-28 DIAGNOSIS — K219 Gastro-esophageal reflux disease without esophagitis: Secondary | ICD-10-CM | POA: Diagnosis not present

## 2012-03-28 DIAGNOSIS — M169 Osteoarthritis of hip, unspecified: Secondary | ICD-10-CM | POA: Diagnosis not present

## 2012-03-28 DIAGNOSIS — M47817 Spondylosis without myelopathy or radiculopathy, lumbosacral region: Secondary | ICD-10-CM | POA: Diagnosis not present

## 2012-03-28 DIAGNOSIS — M19019 Primary osteoarthritis, unspecified shoulder: Secondary | ICD-10-CM | POA: Diagnosis not present

## 2012-03-28 DIAGNOSIS — M25859 Other specified joint disorders, unspecified hip: Secondary | ICD-10-CM | POA: Diagnosis not present

## 2012-03-28 DIAGNOSIS — M4800 Spinal stenosis, site unspecified: Secondary | ICD-10-CM | POA: Diagnosis not present

## 2012-03-28 DIAGNOSIS — M25519 Pain in unspecified shoulder: Secondary | ICD-10-CM | POA: Diagnosis not present

## 2012-03-28 DIAGNOSIS — D7289 Other specified disorders of white blood cells: Secondary | ICD-10-CM | POA: Diagnosis not present

## 2012-03-28 LAB — URINE CULTURE
Colony Count: NO GROWTH
Culture  Setup Time: 201305150832
Culture: NO GROWTH

## 2012-03-28 LAB — CBC
MCH: 32.5 pg (ref 26.0–34.0)
MCHC: 34 g/dL (ref 30.0–36.0)
MCV: 95.7 fL (ref 78.0–100.0)
Platelets: 307 10*3/uL (ref 150–400)
RDW: 12.2 % (ref 11.5–15.5)
WBC: 10.1 10*3/uL (ref 4.0–10.5)

## 2012-03-28 LAB — T4, FREE: Free T4: 1.5 ng/dL (ref 0.80–1.80)

## 2012-03-28 MED ORDER — OXYCODONE HCL 10 MG PO TABS: 20.0000 mg | ORAL_TABLET | ORAL | Status: AC | PRN

## 2012-03-28 MED ORDER — OXYCODONE HCL 5 MG PO TABS
10.0000 mg | ORAL_TABLET | ORAL | Status: DC | PRN
  Administered 2012-03-28: 10 mg via ORAL
  Filled 2012-03-28: qty 2

## 2012-03-28 MED ORDER — OXYCODONE HCL 10 MG PO TABS
10.0000 mg | ORAL_TABLET | ORAL | Status: DC | PRN
Start: 2012-03-28 — End: 2012-03-28

## 2012-03-28 MED ORDER — NICOTINE 14 MG/24HR TD PT24: 1.0000 | MEDICATED_PATCH | Freq: Every day | TRANSDERMAL | Status: AC

## 2012-03-28 NOTE — Progress Notes (Signed)
PT Cancellation Note  Treatment cancelled today due to patient's refusal to participate. Patient had just returned from Xrays and declined ambulation at this time. Will follow up in AM  Angelica [name], Adline Potter 03/28/2012, 11:40 AM  03/28/2012 Fredrich Birks PTA [phone] pager [phone] office

## 2012-03-28 NOTE — Discharge Summary (Signed)
Discharge Summary  Angelica [name] MR#: [id number]  DOB:1941/04/14  Date of Admission: 03/26/2012 Date of Discharge: 03/28/2012  Patient's PCP: Angelica Salisbury, MD, MD  Attending Physician:Giulian Goldring  Consults: Treatment Team:  Maeola Harman, MD   Discharge Diagnoses: Active Problems:  Back pain, subacute  Leukocytosis  Spinal stenosis  Tobacco abuse  Insomnia   Brief Admitting History and Physical Angelica [name] was transferred from [facility] ED for worsening back pain over the last 2 weeks. She is sedated at the time of my evaluation(from meds given in ED) and hence gives limited but helpful history. Her son at bed side helps with filling in the gaps. 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 She has slept less than 2 hours per night per her report, due to the pain. The pain is in the lower back and costophrenic angles, and radiates to both legs. Angelica Trantham denies any fall recently, or dysuria, or fever. She had an MRI of the lumbar spine on 03/20/2012, with no dramatic findings. She was supposed to see Dr Venetia Maxon in the office but she ended up in the ED due to the intractable pain. Pain meds in ED helped some but she is already asking for more meds. Dr Venetia Maxon was consulted by the ED and he requesetd for patient to be followed here at [facility] hence transfer.   Discharge Medications Medication List  As of 03/28/2012  4:38 PM   TAKE these medications         albuterol 108 (90 BASE) MCG/ACT inhaler   Commonly known as: PROVENTIL HFA;VENTOLIN HFA   Inhale 2 puffs into the lungs every 4 (four) hours as needed for wheezing or shortness of breath.      aspirin 81 MG EC tablet   Take 81 mg by mouth daily.      atenolol 25 MG tablet   Commonly known as: TENORMIN   TAKE ONE TABLET BY MOUTH EVERY DAY      cetirizine-pseudoephedrine 5-120 MG per  tablet   Commonly known as: ZYRTEC-D   Take 1 tablet by mouth 2 (two) times daily.      diclofenac sodium 1 % Gel   Commonly known as: VOLTAREN   Apply 1 application topically every 6 (six) hours as needed (knee pain ).      LORazepam 1 MG tablet   Commonly known as: ATIVAN   Take 1 tablet (1 mg total) by mouth every 8 (eight) hours as needed for anxiety.      magic mouthwash Soln   Take 5 mLs by mouth every 4 (four) hours.      methocarbamol 750 MG tablet   Commonly known as: ROBAXIN   Take 1 tablet (750 mg total) by mouth 4 (four) times daily as needed.      multivitamin tablet   Take 1 tablet by mouth daily.      nicotine 14 mg/24hr patch   Commonly known as: NICODERM CQ - dosed in mg/24 hours   Place 1 patch onto the skin daily.      Oxycodone HCl 10 MG Tabs   Take 2 tablets (20 mg total) by mouth every 4 (four) hours as needed (moderate pain.).      promethazine 50 MG/ML injection   Commonly known as: PHENERGAN   Inject 1 mL (50 mg total) into the muscle once.  ranitidine 150 MG capsule   Commonly known as: ZANTAC   Take 150 mg by mouth as needed.      sertraline 100 MG tablet   Commonly known as: ZOLOFT   TAKE ONE TABLET BY MOUTH EVERY DAY      Vitamin D3 2000 UNITS Tabs   Take by mouth daily.            Hospital Course: Back pain, subacute- admit med-surg. Pain meds/Physical therapy. Back pain better, may need outpatient referral for injection.  Dr Venetia Maxon to follow., x ray of lumbar spine and hip ordered after discussion with Dr. Venetia Maxon:  .Leukocytosis- ?cause. Repeat CBC normal. No abx as no clear evidence of infection- chest x ray negative, U/A negative, no fevers- repeat CBC normal .Spinal stenosis- defer to Dr Venetia Maxon- no surgical intervention (did want x-ray of lumbar spine and hip x ray- WNL). .Tobacco abuse- smoking cessation counseling given. Nicotine patch for now. .Insomnia- care with hypnotics.      Day of Discharge BP 97/63  Pulse 65   Temp(Src) 97.7 F (36.5 C) (Oral)  Resp 18  Ht 5\' 3"  (1.6 m)  Wt 55.792 kg (123 lb)  BMI 21.79 kg/m2  SpO2 92% A+Ox3 Feeling better, pain controlled CTA x 2 -c/c/e  Results for orders placed during the hospital encounter of 03/26/12 (from the past 48 hour(s))  VITAMIN B12     Status: Normal   Collection Time   03/26/12  8:58 PM      Component Value Range Comment   Vitamin B-12 244  211 - 911 (pg/mL)   MAGNESIUM     Status: Normal   Collection Time   03/27/12  5:45 AM      Component Value Range Comment   Magnesium 2.1  1.5 - 2.5 (mg/dL)   PHOSPHORUS     Status: Normal   Collection Time   03/27/12  5:45 AM      Component Value Range Comment   Phosphorus 3.7  2.3 - 4.6 (mg/dL)   TSH     Status: Abnormal   Collection Time   03/27/12  5:45 AM      Component Value Range Comment   TSH 0.255 (*) 0.350 - 4.500 (uIU/mL)   APTT     Status: Normal   Collection Time   03/27/12  5:45 AM      Component Value Range Comment   aPTT 36  24 - 37 (seconds)   PROTIME-INR     Status: Normal   Collection Time   03/27/12  5:45 AM      Component Value Range Comment   Prothrombin Time 14.5  11.6 - 15.2 (seconds)    INR 1.11  0.00 - 1.49    URINALYSIS, ROUTINE W REFLEX MICROSCOPIC     Status: Normal   Collection Time   03/27/12  7:41 AM      Component Value Range Comment   Color, Urine YELLOW  YELLOW     APPearance CLEAR  CLEAR     Specific Gravity, Urine 1.025  1.005 - 1.030     pH 5.5  5.0 - 8.0     Glucose, UA NEGATIVE  NEGATIVE (mg/dL)    Hgb urine dipstick NEGATIVE  NEGATIVE     Bilirubin Urine NEGATIVE  NEGATIVE     Ketones, ur NEGATIVE  NEGATIVE (mg/dL)    Protein, ur NEGATIVE  NEGATIVE (mg/dL)    Urobilinogen, UA 0.2  0.0 - 1.0 (mg/dL)    Nitrite NEGATIVE  NEGATIVE     Leukocytes, UA NEGATIVE  NEGATIVE  MICROSCOPIC NOT DONE ON URINES WITH NEGATIVE PROTEIN, BLOOD, LEUKOCYTES, NITRITE, OR GLUCOSE <1000 mg/dL.  URINE CULTURE     Status: Normal   Collection Time   03/27/12  7:41 AM       Component Value Range Comment   Specimen Description URINE, CLEAN CATCH      Special Requests NONE      Culture  Setup Time [id number]      Colony Count NO GROWTH      Culture NO GROWTH      Report Status 03/28/2012 FINAL     CBC     Status: Abnormal   Collection Time   03/28/12 12:07 PM      Component Value Range Comment   WBC 10.1  4.0 - 10.5 (K/uL)    RBC 3.26 (*) 3.87 - 5.11 (MIL/uL)    Hemoglobin 10.6 (*) 12.0 - 15.0 (g/dL)    HCT 66.4 (*) 40.3 - 46.0 (%)    MCV 95.7  78.0 - 100.0 (fL)    MCH 32.5  26.0 - 34.0 (pg)    MCHC 34.0  30.0 - 36.0 (g/dL)    RDW 47.4  25.9 - 56.3 (%)    Platelets 307  150 - 400 (K/uL)     Dg Lumbar Spine 2-3 Views  03/28/2012  *RADIOLOGY REPORT*  Clinical Data: Low back pain that radiates down both legs.  LUMBAR SPINE - 2-3 VIEW  Comparison: Lumbar spine 03/20/2012  Findings: Normal alignment of lumbar vertebral bodies.  There is disc space narrowing at L1-L2 and L5-S1.  There is multiple levels of mild endplate osteophytosis.  No subluxation.  AP projection demonstrates a dextroscoliosis related to degenerative change.  The  IMPRESSION:  1.  No acute findings lumbar spine. 2.  No change from recent lumbar MRI. 3.  Disc osteophytic disease.  Original Report Authenticated By: Genevive Bi, M.D.   Dg Hip Complete Left  03/28/2012  *RADIOLOGY REPORT*  Clinical Data: Hip pain  LEFT HIP - COMPLETE 2+ VIEW  Comparison: Lumbar MRI 03/20/2012  Findings: Hips are located.  No evidence of pelvic fracture. Dedicated view of the left hip demonstrates no femoral neck fracture.  There is joint space narrowing on the left.  IMPRESSION:  1.  No acute findings in the pelvis or left hip. 2.  Mild osteoarthritis of the hips.  Original Report Authenticated By: Genevive Bi, M.D.   Mr Lumbar Spine W Wo Contrast  03/21/2012  **ADDENDUM** CREATED: 03/21/2012 08:20:14  BUN and creatinine were obtained on site at [facility] at [address] Results:  BUN not  available mg/dL,  Creatinine 0.4 mg/dL.  **END ADDENDUM** SIGNED BY: Dineen Kid. Chestine Spore, M.D.    03/20/2012  *RADIOLOGY REPORT*  Clinical Data: Severe low back pain with bilateral leg pain. Remote history of back surgery.  MRI LUMBAR SPINE WITHOUT AND WITH CONTRAST  Technique:  Multiplanar and multiecho pulse sequences of the lumbar spine were obtained without and with intravenous contrast.  Contrast: 10mL MULTIHANCE GADOBENATE DIMEGLUMINE 529 MG/ML IV SOLN  Comparison: Lumbar MRI 05/13/2009  Findings: Dextroscoliosis in the lumbar spine.  Normal sagittal alignment.  Negative for fracture or mass.  No enhancing lesions are identified.  Conus medullaris is normal.  L1-2:  Disc degeneration with disc space narrowing mild spurring, similar to the prior study.  Mild facet degeneration without significant spinal stenosis.  L2-3:  Disc degeneration with disc bulging and mild spurring  on the left.  Mild facet degeneration.  Mild narrowing of the left lateral recess, unchanged from the prior study.  No focal disc protrusion.  L3-4:  Disc degeneration with disc space narrowing.  Disc bulging and mild spurring.  Mild facet degeneration bilaterally with mild spinal stenosis.  This is similar to the prior study.  L4-5:  Disc bulging and vertebral spurring.  Moderate facet and ligamentum flavum hypertrophy.  Progression of facet degeneration since the prior MRI.  Mild to moderate spinal stenosis with slight worsening from the prior study.  Right foraminal encroachment is present with some progression from the  prior study.  L5-S1:  Prior laminectomy on the left.  No recurrent disc protrusion.  Disc degeneration and spurring are present.  This is similar to the prior study.  IMPRESSION: Scoliosis and degenerative changes as above.  Postop laminectomy on the left L5-S1 without recurrent disc protrusion.  Mild to moderate spinal stenosis at L4-5, with mild progression from the prior study.  Original Report Authenticated By: Camelia Phenes, M.D.   Portable Chest 1 View  03/26/2012  *RADIOLOGY REPORT*  Clinical Data: Leukocytosis  PORTABLE CHEST - 1 VIEW  Comparison: 01/09/2011  Findings: Slightly shallow inspiration with elevation of the left hemidiaphragm.  Normal heart size and pulmonary vascularity. Tortuous aorta.  Probable emphysematous changes in the upper lungs. No focal airspace consolidation.  No blunting of costophrenic angles.  No pneumothorax.  No significant change since previous study.  IMPRESSION: Emphysematous changes in the lungs.  No evidence of active pulmonary disease.  Original Report Authenticated By: Marlon Pel, M.D.   Dg Shoulder Left  03/28/2012  *RADIOLOGY REPORT*  Clinical Data: Left shoulder pain  LEFT SHOULDER - 2+ VIEW  Comparison: None.  Findings: Three views of the left shoulder submitted.  No acute fracture or subluxation.  Mild spurring of the left humeral head. Minimal inferior spurring of the glenoid.  Minimal degenerative changes left AC joint.  IMPRESSION: No acute fracture or subluxation.  Mild degenerative changes as described above.  Original Report Authenticated By: Natasha Mead, M.D.     Disposition: home with home health  Diet: cardiac  Activity: as tolerated   Follow-up Appts: Discharge Orders    Future Orders Please Complete By Expires   Diet - low sodium heart healthy      Increase activity slowly      Discharge instructions      Comments:   Home health- PT     Tests to follow up on: Repeat TSH and free t4 at next visit   Time spent on discharge, talking to the patient, and coordinating care: 45 mins.   SignedMarlin Canary, DO 03/28/2012, 4:38 PM

## 2012-03-28 NOTE — Progress Notes (Addendum)
Subjective: C/o low back pain again No CP No SOB No fevers  Objective: Vital signs in last 24 hours: Filed Vitals:   03/27/12 0542 03/27/12 1440 03/27/12 2129 03/28/12 0613  BP: 98/61 99/62 91/54  111/66  Pulse: 82 76 79 76  Temp: 98 F (36.7 C) 98 F (36.7 C) 98.1 F (36.7 C) 97.9 F (36.6 C)  TempSrc: Oral  Oral Oral  Resp: 16 20  20   Height:      Weight:      SpO2: 95% 91% 91% 93%   Weight change:   Intake/Output Summary (Last 24 hours) at 03/28/12 1001 Last data filed at 03/27/12 1811  Gross per 24 hour  Intake    240 ml  Output      0 ml  Net    240 ml    Physical Exam: General: Awake, Oriented, No acute distress. HEENT: EOMI. Neck: Supple CV: S1 and S2, rrr Lungs: Clear to ascultation bilaterally, no wheezing Abdomen: Soft, Nontender, Nondistended, +bowel sounds. Ext: Good pulses. Trace edema.   Lab Results:  Basename 03/27/12 0545 03/26/12 1145  NA -- 135  K -- 3.5  CL -- 94*  CO2 -- 25  GLUCOSE -- 121*  BUN -- 13  CREATININE -- 0.53  CALCIUM -- 9.8  MG 2.1 --  PHOS 3.7 --   No results found for this basename: AST:2,ALT:2,ALKPHOS:2,BILITOT:2,PROT:2,ALBUMIN:2 in the last 72 hours No results found for this basename: LIPASE:2,AMYLASE:2 in the last 72 hours  Basename 03/26/12 1145  WBC 14.1*  NEUTROABS 11.7*  HGB 11.7*  HCT 34.5*  MCV 97.5  PLT 347   No results found for this basename: CKTOTAL:3,CKMB:3,CKMBINDEX:3,TROPONINI:3 in the last 72 hours No components found with this basename: POCBNP:3 No results found for this basename: DDIMER:2 in the last 72 hours No results found for this basename: HGBA1C:2 in the last 72 hours No results found for this basename: CHOL:2,HDL:2,LDLCALC:2,TRIG:2,CHOLHDL:2,LDLDIRECT:2 in the last 72 hours  Basename 03/27/12 0545  TSH 0.255*  T4TOTAL --  T3FREE --  THYROIDAB --    Basename 03/26/12 2058  VITAMINB12 244  FOLATE --  FERRITIN --  TIBC --  IRON --  RETICCTPCT --    Micro Results: Recent  Results (from the past 240 hour(s))  URINE CULTURE     Status: Normal   Collection Time   03/27/12  7:41 AM      Component Value Range Status Comment   Specimen Description URINE, CLEAN CATCH   Final    Special Requests NONE   Final    Culture  Setup Time [id number]   Final    Colony Count NO GROWTH   Final    Culture NO GROWTH   Final    Report Status 03/28/2012 FINAL   Final     Studies/Results: Portable Chest 1 View  03/26/2012  *RADIOLOGY REPORT*  Clinical Data: Leukocytosis  PORTABLE CHEST - 1 VIEW  Comparison: 01/09/2011  Findings: Slightly shallow inspiration with elevation of the left hemidiaphragm.  Normal heart size and pulmonary vascularity. Tortuous aorta.  Probable emphysematous changes in the upper lungs. No focal airspace consolidation.  No blunting of costophrenic angles.  No pneumothorax.  No significant change since previous study.  IMPRESSION: Emphysematous changes in the lungs.  No evidence of active pulmonary disease.  Original Report Authenticated By: Marlon Pel, M.D.    Medications: I have reviewed the patient's current medications. Scheduled Meds:    . aspirin EC  81 mg Oral Daily  . atenolol  25 mg Oral Daily  . famotidine  20 mg Oral Daily  . loratadine  10 mg Oral Daily  . mulitivitamin with minerals  1 tablet Oral Daily  . nicotine  14 mg Transdermal Daily  . sertraline  100 mg Oral Daily   Continuous Infusions:    . sodium chloride 50 mL/hr at 03/27/12 0052   PRN Meds:.acetaminophen, acetaminophen, albuterol, diclofenac sodium, LORazepam, methocarbamol, morphine injection, oxyCODONE, DISCONTD: oxyCODONE  Assessment/Plan: Intractable, worsening back pain in elderly female with DJD, mild to moderate spinal stenosis.  .Back pain, subacute- admit med-surg. Pain meds/Physical therapy. Dr Venetia Maxon to follow., x ray of lumbar spine and hip .Leukocytosis- ?cause.  Repeat CBC in am. Will not start abx, unless clear evidence of infection- chest x ray  negative, U/A negative, no fevers .Spinal stenosis- defer to Dr Venetia Maxon- no surgical intervention (did want x-ray of lumbar spine and hip x ray). .Tobacco abuse- smoking cessation counseling given. Nicotine patch for now. .Insomnia- care with hypnotics.  Dvt/gi prophylaxis.     LOS: 2 days  Brennen Gardiner, DO 03/28/2012, 10:01 AM

## 2012-03-28 NOTE — Discharge Instructions (Signed)
Spinal Stenosis Spinal stenosis means the space around your spinal cord and nerve roots is too narrow. The bones around your spinal cord pinch your spinal cord nerves. This pinching causes pain in the back and legs. Your doctor might give you medicines to help. In some cases, surgery is needed. HOME CARE  Do exercises and stretches as told by your doctor. Lean forward while walking. Lie down and bring your knees up to your chest.   Rest, and then slowly go back to your activity.   Do aerobic activities, such as walking, biking, and swimming, as told by your doctor. Try to increase your amount of activity over time.   Lose weight if your doctor recommends it. This reduces the load on your spine.   Apply warm or cold packs for pain as told by your doctor.  GET HELP RIGHT AWAY IF:  Your pain comes back more and more often.   You have pain that goes down your leg, even when you are not standing or walking.   You cannot control when you pee (urinate) or poop (bowel movement).   You lose feeling (numbness) in your legs and it does not come back.   You cannot move your legs.  MAKE SURE YOU:  Understand these instructions.   Will watch your condition.   Will get help right away if you are not doing well or get worse.  Document Released: 02/23/2011 Document Revised: 10/19/2011 Document Reviewed: 02/23/2011 Exit[employer] Patient Information 2012 [employer]. 

## 2012-03-28 NOTE — Care Management Note (Signed)
    Page 1 of 1   03/28/2012     2:57:29 PM   CARE MANAGEMENT NOTE 03/28/2012  Patient:  Angelica [name], Angelica [name]   Account Number:  [id number]  Date Initiated:  03/27/2012  Documentation initiated by:  PARROTT,CYNTHIA  Subjective/Objective Assessment:   Back pain     Action/Plan:   Anticipated DC Date:  03/28/2012   Anticipated DC Plan:  HOME/SELF CARE      DC Planning Services  CM consult      Choice offered to / List presented to:  C-1 Patient           [facility]  [facility]   Status of service:  In process, will continue to follow Medicare Important Message given?   (If response is "NO", the following Medicare IM given date fields will be blank) Date Medicare IM given:   Date Additional Medicare IM given:    Discharge Disposition:    Per UR Regulation:  Reviewed for med. necessity/level of care/duration of stay  If discussed at Long Length of Stay Meetings, dates discussed:    Comments:  03/28/12 Onnie Boer, RN, BSN 1225 PT WAS ADMITTED WITH SEVERE BACK PAIN.  PTA PT WAS AT HOME WITH HER FAMILY.  PT STILL NEEDS A FEW SCANS DONE BEFORE SHE IS DC'D.  HER PAIN IS MUCH BETTER CONTROLLED AND SHE IS NOT A SURGICAL CANDIDATE.  HH PT HAS BEEN ARRANGED WITH [facility].  PT REFUSED A RW AS SHE STATES THAT SHE HAS ONE AT HOME AND WILL WAIT TILL SHE SEES HOW SHE FUNCTIONS AT HOME TO GET A 3N1.  PT MAYBE DC'D TO HOME TODAY.  PT ADMITTED AS IP STATUS.  PER DR HILTON-CLARKE WITH EHR PT IS APPROPRIATE FOR OBS STATUS AT THIS TIME. CODE 44 ISSUED TO PT.

## 2012-04-02 DIAGNOSIS — J449 Chronic obstructive pulmonary disease, unspecified: Secondary | ICD-10-CM | POA: Diagnosis not present

## 2012-04-02 DIAGNOSIS — IMO0001 Reserved for inherently not codable concepts without codable children: Secondary | ICD-10-CM | POA: Diagnosis not present

## 2012-04-02 DIAGNOSIS — R269 Unspecified abnormalities of gait and mobility: Secondary | ICD-10-CM | POA: Diagnosis not present

## 2012-04-02 DIAGNOSIS — M4800 Spinal stenosis, site unspecified: Secondary | ICD-10-CM | POA: Diagnosis not present

## 2012-04-02 DIAGNOSIS — M545 Low back pain: Secondary | ICD-10-CM | POA: Diagnosis not present

## 2012-04-09 DIAGNOSIS — M4800 Spinal stenosis, site unspecified: Secondary | ICD-10-CM | POA: Diagnosis not present

## 2012-04-09 DIAGNOSIS — J449 Chronic obstructive pulmonary disease, unspecified: Secondary | ICD-10-CM | POA: Diagnosis not present

## 2012-04-09 DIAGNOSIS — M545 Low back pain: Secondary | ICD-10-CM | POA: Diagnosis not present

## 2012-04-09 DIAGNOSIS — R269 Unspecified abnormalities of gait and mobility: Secondary | ICD-10-CM | POA: Diagnosis not present

## 2012-04-09 DIAGNOSIS — IMO0001 Reserved for inherently not codable concepts without codable children: Secondary | ICD-10-CM | POA: Diagnosis not present

## 2012-04-11 DIAGNOSIS — IMO0001 Reserved for inherently not codable concepts without codable children: Secondary | ICD-10-CM | POA: Diagnosis not present

## 2012-04-11 DIAGNOSIS — R269 Unspecified abnormalities of gait and mobility: Secondary | ICD-10-CM | POA: Diagnosis not present

## 2012-04-11 DIAGNOSIS — J449 Chronic obstructive pulmonary disease, unspecified: Secondary | ICD-10-CM | POA: Diagnosis not present

## 2012-04-11 DIAGNOSIS — M4800 Spinal stenosis, site unspecified: Secondary | ICD-10-CM | POA: Diagnosis not present

## 2012-04-11 DIAGNOSIS — M545 Low back pain: Secondary | ICD-10-CM | POA: Diagnosis not present

## 2012-04-18 DIAGNOSIS — R269 Unspecified abnormalities of gait and mobility: Secondary | ICD-10-CM | POA: Diagnosis not present

## 2012-04-18 DIAGNOSIS — IMO0001 Reserved for inherently not codable concepts without codable children: Secondary | ICD-10-CM | POA: Diagnosis not present

## 2012-04-18 DIAGNOSIS — M4800 Spinal stenosis, site unspecified: Secondary | ICD-10-CM | POA: Diagnosis not present

## 2012-04-18 DIAGNOSIS — J449 Chronic obstructive pulmonary disease, unspecified: Secondary | ICD-10-CM | POA: Diagnosis not present

## 2012-04-18 DIAGNOSIS — M545 Low back pain: Secondary | ICD-10-CM | POA: Diagnosis not present

## 2012-04-19 DIAGNOSIS — R269 Unspecified abnormalities of gait and mobility: Secondary | ICD-10-CM | POA: Diagnosis not present

## 2012-04-19 DIAGNOSIS — IMO0001 Reserved for inherently not codable concepts without codable children: Secondary | ICD-10-CM | POA: Diagnosis not present

## 2012-04-19 DIAGNOSIS — M4800 Spinal stenosis, site unspecified: Secondary | ICD-10-CM | POA: Diagnosis not present

## 2012-04-19 DIAGNOSIS — M545 Low back pain: Secondary | ICD-10-CM | POA: Diagnosis not present

## 2012-04-19 DIAGNOSIS — J449 Chronic obstructive pulmonary disease, unspecified: Secondary | ICD-10-CM | POA: Diagnosis not present

## 2012-04-22 DIAGNOSIS — M5137 Other intervertebral disc degeneration, lumbosacral region: Secondary | ICD-10-CM | POA: Diagnosis not present

## 2012-04-24 DIAGNOSIS — M545 Low back pain: Secondary | ICD-10-CM | POA: Diagnosis not present

## 2012-04-24 DIAGNOSIS — M4800 Spinal stenosis, site unspecified: Secondary | ICD-10-CM | POA: Diagnosis not present

## 2012-04-24 DIAGNOSIS — R269 Unspecified abnormalities of gait and mobility: Secondary | ICD-10-CM | POA: Diagnosis not present

## 2012-04-24 DIAGNOSIS — IMO0001 Reserved for inherently not codable concepts without codable children: Secondary | ICD-10-CM | POA: Diagnosis not present

## 2012-04-25 DIAGNOSIS — J449 Chronic obstructive pulmonary disease, unspecified: Secondary | ICD-10-CM | POA: Diagnosis not present

## 2012-04-25 DIAGNOSIS — R269 Unspecified abnormalities of gait and mobility: Secondary | ICD-10-CM | POA: Diagnosis not present

## 2012-04-25 DIAGNOSIS — IMO0001 Reserved for inherently not codable concepts without codable children: Secondary | ICD-10-CM | POA: Diagnosis not present

## 2012-04-25 DIAGNOSIS — M545 Low back pain: Secondary | ICD-10-CM | POA: Diagnosis not present

## 2012-04-25 DIAGNOSIS — M4800 Spinal stenosis, site unspecified: Secondary | ICD-10-CM | POA: Diagnosis not present

## 2012-05-28 DIAGNOSIS — M5137 Other intervertebral disc degeneration, lumbosacral region: Secondary | ICD-10-CM | POA: Diagnosis not present

## 2012-07-18 DIAGNOSIS — M5137 Other intervertebral disc degeneration, lumbosacral region: Secondary | ICD-10-CM | POA: Diagnosis not present

## 2012-08-05 ENCOUNTER — Encounter: Payer: Self-pay | Admitting: Internal Medicine

## 2012-08-28 ENCOUNTER — Telehealth: Payer: Self-pay | Admitting: Family Medicine

## 2012-08-28 NOTE — Telephone Encounter (Signed)
Caller: Kathie Rhodes Jo/Patient; Patient Name: Angelica [name]; PCP: Gershon Crane [facility]); Best Callback Phone Number: [phone].  Patient calling about sinus pain and cough.  Believes she has sinus infection and possible bronchitis.  Onset of symptoms 08/21/12.  Taking zyrtec daily.  Afebrile.  Per URI protocol, emergent symptoms denied; advised being seen within 24 hours.  No appt available within disposition time frame per Epic; info to office for staff/provider review/workin appt.   MAY REACH PATIENT AT [phone].

## 2012-08-29 NOTE — Telephone Encounter (Signed)
No appts avail w/Dr. Clent Ridges today. Called and [facility] for pt to call and see someone else today - other MDs do have availability.

## 2012-08-30 ENCOUNTER — Ambulatory Visit (INDEPENDENT_AMBULATORY_CARE_PROVIDER_SITE_OTHER): Payer: Medicare Other | Admitting: Family Medicine

## 2012-08-30 ENCOUNTER — Encounter: Payer: Self-pay | Admitting: Family Medicine

## 2012-08-30 VITALS — BP 100/62 | HR 83 | Temp 98.0°F | Wt 120.0 lb

## 2012-08-30 DIAGNOSIS — F411 Generalized anxiety disorder: Secondary | ICD-10-CM

## 2012-08-30 DIAGNOSIS — F419 Anxiety disorder, unspecified: Secondary | ICD-10-CM

## 2012-08-30 DIAGNOSIS — J40 Bronchitis, not specified as acute or chronic: Secondary | ICD-10-CM

## 2012-08-30 MED ORDER — AZITHROMYCIN 250 MG PO TABS: ORAL_TABLET | ORAL | Status: DC

## 2012-08-30 MED ORDER — ALPRAZOLAM 1 MG PO TABS: 1.0000 mg | ORAL_TABLET | Freq: Two times a day (BID) | ORAL | Status: DC | PRN

## 2012-08-30 NOTE — Progress Notes (Signed)
  Subjective:    Patient ID: Angelica [name], female    DOB: 05-10-1941, 71 y.o.   MRN: [id number]  HPI Here for 4 days of chest congestion and a dry cough. No fever. Also she has not been getting good results from Ativan for her anxiety, so she tried a few Xanax form a friend. These worked well and she would like to switch to these.    Review of Systems  Constitutional: Negative.   HENT: Negative.   Eyes: Negative.   Respiratory: Positive for cough, chest tightness and wheezing.        Objective:   Physical Exam  Constitutional: She appears well-developed and well-nourished.  HENT:  Right Ear: External ear normal.  Left Ear: External ear normal.  Nose: Nose normal.  Mouth/Throat: Oropharynx is clear and moist.  Eyes: Conjunctivae normal are normal.  Pulmonary/Chest: Effort normal. No respiratory distress. She has no rales.       Scattered rhonchi and wheezes  Lymphadenopathy:    She has no cervical adenopathy.  Psychiatric: She has a normal mood and affect. Her behavior is normal. Thought content normal.          Assessment & Plan:  Use a Zpack for the bronchitis. We will switch from Ativan to Xanax.

## 2012-09-25 ENCOUNTER — Other Ambulatory Visit: Payer: Self-pay | Admitting: Family Medicine

## 2012-10-04 DIAGNOSIS — Z23 Encounter for immunization: Secondary | ICD-10-CM | POA: Diagnosis not present

## 2012-11-12 ENCOUNTER — Telehealth: Payer: Self-pay | Admitting: Family Medicine

## 2012-11-12 NOTE — Telephone Encounter (Signed)
Pt states she needs to see Dr Clent Ridges Monday due to sinus infection.  Pt is afraid drainage is going to turn into bronchitus as it has in the past.  Also wants to talk to about another pain doctor. Only appts left are same day appts. Thank you

## 2012-11-12 NOTE — Telephone Encounter (Signed)
If pt needs to be seen earlier, then we can check to see if another provider has any openings.

## 2012-11-18 ENCOUNTER — Encounter: Payer: Self-pay | Admitting: Family Medicine

## 2012-11-18 ENCOUNTER — Ambulatory Visit (INDEPENDENT_AMBULATORY_CARE_PROVIDER_SITE_OTHER): Payer: Medicare Other | Admitting: Family Medicine

## 2012-11-18 VITALS — BP 100/64 | HR 94 | Temp 97.7°F | Wt 120.0 lb

## 2012-11-18 DIAGNOSIS — M545 Low back pain, unspecified: Secondary | ICD-10-CM

## 2012-11-18 DIAGNOSIS — J329 Chronic sinusitis, unspecified: Secondary | ICD-10-CM

## 2012-11-18 MED ORDER — AZITHROMYCIN 250 MG PO TABS: ORAL_TABLET | ORAL | Status: DC

## 2012-11-18 MED ORDER — OXYCODONE-ACETAMINOPHEN 10-325 MG PO TABS: 1.0000 | ORAL_TABLET | ORAL | Status: DC | PRN

## 2012-11-18 NOTE — Progress Notes (Signed)
  Subjective:    Patient ID: Angelica [name], female    DOB: [birth year], 72 y.o.   MRN: [id number]  HPI Here for a sinus infection and to discuss her chronic low back pain. She had seen Dr. Venetia Maxon last year, and he said she would never be candidate for surgical repair. He referred her to Dr. Ollen Bowl whi=o gave her several epidural steroid shots. These were not very helpful so she has been relying on medications to help her the most. She gets good relief with Percocet but this runs out after several hours. She does well by taking 4 a day on most days but may need a 5th dose at bedtime if she is very active that day. Also for the past week she has had stuffy head, PND, ST, and a dry cough. No fever.    Review of Systems  Constitutional: Negative.   HENT: Positive for congestion, postnasal drip and sinus pressure.   Eyes: Negative.   Respiratory: Positive for cough.   Musculoskeletal: Positive for back pain.       Objective:   Physical Exam  Constitutional: She appears well-developed and well-nourished.       Walks with a cane   HENT:  Right Ear: External ear normal.  Left Ear: External ear normal.  Nose: Nose normal.  Mouth/Throat: Oropharynx is clear and moist.  Eyes: Conjunctivae normal are normal.  Neck: No thyromegaly present.  Cardiovascular: Normal rate, regular rhythm, normal heart sounds and intact distal pulses.   Pulmonary/Chest: Effort normal and breath sounds normal.  Lymphadenopathy:    She has no cervical adenopathy.          Assessment & Plan:  Treat with a Zpack. She asked me to take over prescribing her pain meds because she does not plan on seeing Dr. Ollen Bowl any more. I agreed to do so.

## 2012-11-18 NOTE — Telephone Encounter (Signed)
Pt came in today/kjh

## 2012-12-14 ENCOUNTER — Other Ambulatory Visit: Payer: Self-pay | Admitting: Family Medicine

## 2012-12-20 ENCOUNTER — Ambulatory Visit (INDEPENDENT_AMBULATORY_CARE_PROVIDER_SITE_OTHER): Payer: Medicare Other | Admitting: Family Medicine

## 2012-12-20 ENCOUNTER — Encounter: Payer: Self-pay | Admitting: Family Medicine

## 2012-12-20 VITALS — BP 100/58 | HR 78 | Temp 97.7°F | Wt 118.0 lb

## 2012-12-20 DIAGNOSIS — M1990 Unspecified osteoarthritis, unspecified site: Secondary | ICD-10-CM | POA: Diagnosis not present

## 2012-12-20 DIAGNOSIS — J329 Chronic sinusitis, unspecified: Secondary | ICD-10-CM | POA: Diagnosis not present

## 2012-12-20 MED ORDER — CEFUROXIME AXETIL 500 MG PO TABS: 500.0000 mg | ORAL_TABLET | Freq: Two times a day (BID) | ORAL | Status: DC

## 2012-12-20 MED ORDER — OXYCODONE-ACETAMINOPHEN 10-325 MG PO TABS: 1.0000 | ORAL_TABLET | ORAL | Status: DC | PRN

## 2012-12-20 MED ORDER — METHYLPREDNISOLONE ACETATE 80 MG/ML IJ SUSP
120.0000 mg | Freq: Once | INTRAMUSCULAR | Status: AC
  Administered 2012-12-20: 120 mg via INTRAMUSCULAR

## 2012-12-20 MED ORDER — ALBUTEROL SULFATE HFA 108 (90 BASE) MCG/ACT IN AERS: 2.0000 | INHALATION_SPRAY | RESPIRATORY_TRACT | Status: DC | PRN

## 2012-12-20 NOTE — Addendum Note (Signed)
Addended by: Aniceto Boss A on: 12/20/2012 11:59 AM   Modules accepted: Orders

## 2012-12-20 NOTE — Progress Notes (Signed)
  Subjective:    Patient ID: Angelica [name], female    DOB: 06-Feb-1941, 72 y.o.   MRN: [id number]  HPI Here for refills and for another sinus infection. She was given a Zpack one month ago and this was successful. However she was around her grandchildren this week and has gotten another infection. She has head congestion, HA, and sinus pressure.    Review of Systems  Constitutional: Negative.   HENT: Positive for congestion, postnasal drip and sinus pressure.   Eyes: Negative.   Respiratory: Positive for cough.        Objective:   Physical Exam  Constitutional: She appears well-developed and well-nourished.  HENT:  Right Ear: External ear normal.  Left Ear: External ear normal.  Nose: Nose normal.  Mouth/Throat: Oropharynx is clear and moist.  Eyes: Conjunctivae normal are normal.  Pulmonary/Chest: Effort normal and breath sounds normal.  Lymphadenopathy:    She has no cervical adenopathy.          Assessment & Plan:  Recheck prn

## 2013-01-14 ENCOUNTER — Telehealth: Payer: Self-pay | Admitting: Family Medicine

## 2013-01-14 MED ORDER — OXYCODONE-ACETAMINOPHEN 10-325 MG PO TABS: 1.0000 | ORAL_TABLET | ORAL | Status: DC | PRN

## 2013-01-14 NOTE — Telephone Encounter (Signed)
Tell her I wrote for more Percocet but they need to come in to pick this up

## 2013-01-14 NOTE — Telephone Encounter (Signed)
Script is ready for pick up and I spoke with pt.  

## 2013-01-14 NOTE — Telephone Encounter (Signed)
Duplicate note

## 2013-01-14 NOTE — Telephone Encounter (Signed)
Patient Information:  Caller Name: Mery  Phone: [phone]  Patient: Angelica [name], Angelica [name]  Gender: Female  DOB: 22-Jul-1941  Age: 72 Years  PCP: Gershon Crane [facility])  Office Follow Up:  Does the office need to follow up with this patient?: Yes  Instructions For The Office: See in office or ED per Back Pain protocol due to pain radiates into groin.  Caller asis for Rx for Percocet with directions and quantity that she can take 2 tablets at a time. Shtates her daughter can p ick up Rx from office.  RN Note:  No appointments with Dr. Clent Ridges this date; note to office for possible work in appointment.  Caller requests that MD write Rx for pain meds instead and begs for it to be done on 01/14/13; states her daughter can pick it up.  Symptoms  Reason For Call & Symptoms: Back pain rated at 7 of 10 that goes into left leg and groin;  chronic pain indicated.   Patient calling to request Rx for Percocet; states it must be written for her to pick up Rx form office.  She reports it is taken for back pain.  She requests that MD write Rx fwith enough that she can take 2 tablets at a time due to her severe pain.  Reviewed EMR and found order 12/20/12 for  150 tablets Oxycodone /APAP 10/325 sig 1 q4h prn pain.  Reviewed Health History In EMR: Yes  Reviewed Medications In EMR: Yes  Reviewed Allergies In EMR: Yes  Reviewed Surgeries / Procedures: Yes  Date of Onset of Symptoms: Unknown  Treatments Tried: Percocet, Robaxin- minimal relief  Treatments Tried Worked: No  Guideline(s) Used:  Back Pain  Disposition Per Guideline:   Go to ED Now (or to Office with PCP Approval)  Reason For Disposition Reached:   Pain radiates into groin, scrotum  Advice Given:  Reassurance:  With treatment, the pain most often goes away in 1-2 weeks.  Here is some care advice that should help.  Cold or Heat:  Cold Pack: For pain or swelling, use a cold pack or ice wrapped in a wet cloth. Put it on the sore area for  20 minutes. Repeat 4 times on the first day, then as needed.  Heat Pack: If pain lasts over 2 days, apply heat to the sore area. Use a heat pack, heating pad, or warm wet washcloth. Do this for 10 minutes, then as needed. For widespread stiffness, take a hot bath or hot shower instead. Move the sore area under the warm water.  Sleep:  Sleep on your side with a pillow between your knees. If you sleep on your back, put a pillow under your knees.  Avoid sleeping on your stomach.  Activity  Keep doing your day-to-day activities if it is not too painful. Staying active is better than resting.  Call Back If:  You have more questions  You become worse.  RN Overrode Recommendation:  Patient Requests Prescription  Asks for Percocet Rx for her daughter to pick up today!

## 2013-01-14 NOTE — Telephone Encounter (Signed)
Patient's daughter called stating that she need a refill of her oxycodone 10/325mg  1po q 4hrs prn for pain. Please assist. Per patient's daughter she only has 2 left.

## 2013-01-17 ENCOUNTER — Emergency Department (HOSPITAL_COMMUNITY): Payer: Medicare Other

## 2013-01-17 ENCOUNTER — Inpatient Hospital Stay (HOSPITAL_COMMUNITY)
Admission: EM | Admit: 2013-01-17 | Discharge: 2013-01-22 | DRG: 552 | Disposition: A | Discharge status: Skilled Nursing Facility | Payer: Medicare Other | Attending: Internal Medicine | Admitting: Internal Medicine

## 2013-01-17 DIAGNOSIS — M169 Osteoarthritis of hip, unspecified: Secondary | ICD-10-CM | POA: Diagnosis not present

## 2013-01-17 DIAGNOSIS — M4800 Spinal stenosis, site unspecified: Secondary | ICD-10-CM

## 2013-01-17 DIAGNOSIS — Z8711 Personal history of peptic ulcer disease: Secondary | ICD-10-CM | POA: Diagnosis not present

## 2013-01-17 DIAGNOSIS — R1084 Generalized abdominal pain: Secondary | ICD-10-CM | POA: Diagnosis not present

## 2013-01-17 DIAGNOSIS — M4120 Other idiopathic scoliosis, site unspecified: Secondary | ICD-10-CM | POA: Diagnosis not present

## 2013-01-17 DIAGNOSIS — M549 Dorsalgia, unspecified: Secondary | ICD-10-CM | POA: Diagnosis not present

## 2013-01-17 DIAGNOSIS — N281 Cyst of kidney, acquired: Secondary | ICD-10-CM | POA: Diagnosis not present

## 2013-01-17 DIAGNOSIS — M48061 Spinal stenosis, lumbar region without neurogenic claudication: Principal | ICD-10-CM

## 2013-01-17 DIAGNOSIS — Z79899 Other long term (current) drug therapy: Secondary | ICD-10-CM

## 2013-01-17 DIAGNOSIS — F17200 Nicotine dependence, unspecified, uncomplicated: Secondary | ICD-10-CM | POA: Diagnosis present

## 2013-01-17 DIAGNOSIS — M47817 Spondylosis without myelopathy or radiculopathy, lumbosacral region: Secondary | ICD-10-CM | POA: Diagnosis present

## 2013-01-17 DIAGNOSIS — I10 Essential (primary) hypertension: Secondary | ICD-10-CM | POA: Diagnosis present

## 2013-01-17 DIAGNOSIS — M545 Low back pain, unspecified: Secondary | ICD-10-CM | POA: Diagnosis not present

## 2013-01-17 DIAGNOSIS — K5900 Constipation, unspecified: Secondary | ICD-10-CM | POA: Diagnosis not present

## 2013-01-17 DIAGNOSIS — M1990 Unspecified osteoarthritis, unspecified site: Secondary | ICD-10-CM

## 2013-01-17 DIAGNOSIS — Z8719 Personal history of other diseases of the digestive system: Secondary | ICD-10-CM | POA: Diagnosis not present

## 2013-01-17 DIAGNOSIS — R319 Hematuria, unspecified: Secondary | ICD-10-CM

## 2013-01-17 DIAGNOSIS — J4489 Other specified chronic obstructive pulmonary disease: Secondary | ICD-10-CM | POA: Diagnosis present

## 2013-01-17 DIAGNOSIS — R002 Palpitations: Secondary | ICD-10-CM | POA: Diagnosis not present

## 2013-01-17 DIAGNOSIS — R252 Cramp and spasm: Secondary | ICD-10-CM

## 2013-01-17 DIAGNOSIS — Z720 Tobacco use: Secondary | ICD-10-CM

## 2013-01-17 DIAGNOSIS — D72829 Elevated white blood cell count, unspecified: Secondary | ICD-10-CM

## 2013-01-17 DIAGNOSIS — M5137 Other intervertebral disc degeneration, lumbosacral region: Secondary | ICD-10-CM | POA: Diagnosis not present

## 2013-01-17 DIAGNOSIS — R52 Pain, unspecified: Secondary | ICD-10-CM | POA: Diagnosis not present

## 2013-01-17 DIAGNOSIS — M1610 Unilateral primary osteoarthritis, unspecified hip: Secondary | ICD-10-CM | POA: Diagnosis not present

## 2013-01-17 DIAGNOSIS — Z880 Allergy status to penicillin: Secondary | ICD-10-CM | POA: Diagnosis not present

## 2013-01-17 DIAGNOSIS — K4090 Unilateral inguinal hernia, without obstruction or gangrene, not specified as recurrent: Secondary | ICD-10-CM

## 2013-01-17 DIAGNOSIS — J309 Allergic rhinitis, unspecified: Secondary | ICD-10-CM

## 2013-01-17 DIAGNOSIS — E785 Hyperlipidemia, unspecified: Secondary | ICD-10-CM | POA: Diagnosis present

## 2013-01-17 DIAGNOSIS — F411 Generalized anxiety disorder: Secondary | ICD-10-CM | POA: Diagnosis not present

## 2013-01-17 DIAGNOSIS — R1011 Right upper quadrant pain: Secondary | ICD-10-CM | POA: Diagnosis not present

## 2013-01-17 DIAGNOSIS — Z5189 Encounter for other specified aftercare: Secondary | ICD-10-CM | POA: Diagnosis not present

## 2013-01-17 DIAGNOSIS — F3289 Other specified depressive episodes: Secondary | ICD-10-CM | POA: Diagnosis present

## 2013-01-17 DIAGNOSIS — F329 Major depressive disorder, single episode, unspecified: Secondary | ICD-10-CM | POA: Diagnosis present

## 2013-01-17 DIAGNOSIS — J209 Acute bronchitis, unspecified: Secondary | ICD-10-CM

## 2013-01-17 DIAGNOSIS — K219 Gastro-esophageal reflux disease without esophagitis: Secondary | ICD-10-CM | POA: Diagnosis present

## 2013-01-17 DIAGNOSIS — Z86010 Personal history of colonic polyps: Secondary | ICD-10-CM

## 2013-01-17 DIAGNOSIS — J449 Chronic obstructive pulmonary disease, unspecified: Secondary | ICD-10-CM

## 2013-01-17 DIAGNOSIS — K5730 Diverticulosis of large intestine without perforation or abscess without bleeding: Secondary | ICD-10-CM

## 2013-01-17 DIAGNOSIS — R079 Chest pain, unspecified: Secondary | ICD-10-CM

## 2013-01-17 DIAGNOSIS — G4700 Insomnia, unspecified: Secondary | ICD-10-CM

## 2013-01-17 LAB — CBC WITH DIFFERENTIAL/PLATELET
Basophils Relative: 0 % (ref 0–1)
Eosinophils Absolute: 0.2 10*3/uL (ref 0.0–0.7)
MCH: 32.5 pg (ref 26.0–34.0)
MCHC: 34.8 g/dL (ref 30.0–36.0)
Neutro Abs: 5.1 10*3/uL (ref 1.7–7.7)
Neutrophils Relative %: 59 % (ref 43–77)
Platelets: 255 10*3/uL (ref 150–400)
RBC: 4.19 MIL/uL (ref 3.87–5.11)

## 2013-01-17 LAB — URINE MICROSCOPIC-ADD ON

## 2013-01-17 LAB — URINALYSIS, ROUTINE W REFLEX MICROSCOPIC
Nitrite: NEGATIVE
Specific Gravity, Urine: 1.022 (ref 1.005–1.030)
Urobilinogen, UA: 0.2 mg/dL (ref 0.0–1.0)

## 2013-01-17 LAB — BASIC METABOLIC PANEL
Chloride: 101 mEq/L (ref 96–112)
GFR calc Af Amer: >90 mL/min (ref >90)
GFR calc non Af Amer: >90 mL/min (ref >90)
Potassium: 3.9 mEq/L (ref 3.5–5.1)
Sodium: 137 mEq/L (ref 135–145)

## 2013-01-17 MED ORDER — ALBUTEROL SULFATE HFA 108 (90 BASE) MCG/ACT IN AERS: 2.0000 | INHALATION_SPRAY | RESPIRATORY_TRACT | Status: DC | PRN

## 2013-01-17 MED ORDER — DICLOFENAC SODIUM 1 % TD GEL
1.0000 "application " | Freq: Four times a day (QID) | TRANSDERMAL | Status: DC | PRN
  Filled 2013-01-17: qty 100

## 2013-01-17 MED ORDER — HYDROMORPHONE HCL PF 2 MG/ML IJ SOLN
2.0000 mg | Freq: Once | INTRAMUSCULAR | Status: DC
  Filled 2013-01-17 (×2): qty 1

## 2013-01-17 MED ORDER — ADULT MULTIVITAMIN W/MINERALS CH
1.0000 | ORAL_TABLET | Freq: Every day | ORAL | Status: DC
  Administered 2013-01-18 – 2013-01-22 (×5): 1 via ORAL
  Filled 2013-01-17 (×5): qty 1

## 2013-01-17 MED ORDER — METHOCARBAMOL 750 MG PO TABS
750.0000 mg | ORAL_TABLET | Freq: Four times a day (QID) | ORAL | Status: DC
  Administered 2013-01-17 – 2013-01-22 (×18): 750 mg via ORAL
  Filled 2013-01-17: qty 1
  Filled 2013-01-17: qty 2
  Filled 2013-01-17 (×19): qty 1

## 2013-01-17 MED ORDER — ASPIRIN EC 81 MG PO TBEC
81.0000 mg | DELAYED_RELEASE_TABLET | Freq: Every day | ORAL | Status: DC
  Administered 2013-01-18 – 2013-01-22 (×5): 81 mg via ORAL
  Filled 2013-01-17 (×5): qty 1

## 2013-01-17 MED ORDER — ONDANSETRON HCL 4 MG/2ML IJ SOLN
4.0000 mg | Freq: Once | INTRAMUSCULAR | Status: AC
  Administered 2013-01-17: 4 mg via INTRAVENOUS
  Filled 2013-01-17: qty 2

## 2013-01-17 MED ORDER — LORATADINE 10 MG PO TABS
10.0000 mg | ORAL_TABLET | Freq: Every day | ORAL | Status: DC
  Administered 2013-01-18 – 2013-01-22 (×5): 10 mg via ORAL
  Filled 2013-01-17 (×5): qty 1

## 2013-01-17 MED ORDER — METHYLPREDNISOLONE SODIUM SUCC 40 MG IJ SOLR
40.0000 mg | Freq: Two times a day (BID) | INTRAMUSCULAR | Status: DC
  Filled 2013-01-17 (×2): qty 1

## 2013-01-17 MED ORDER — ATENOLOL 25 MG PO TABS
25.0000 mg | ORAL_TABLET | Freq: Every day | ORAL | Status: DC
  Administered 2013-01-18 – 2013-01-22 (×5): 25 mg via ORAL
  Filled 2013-01-17 (×5): qty 1

## 2013-01-17 MED ORDER — METHYLPREDNISOLONE SODIUM SUCC 125 MG IJ SOLR
125.0000 mg | Freq: Once | INTRAMUSCULAR | Status: AC
  Administered 2013-01-17: 125 mg via INTRAVENOUS
  Filled 2013-01-17: qty 2

## 2013-01-17 MED ORDER — HYDROMORPHONE HCL PF 1 MG/ML IJ SOLN
1.0000 mg | Freq: Once | INTRAMUSCULAR | Status: AC
  Administered 2013-01-17: 1 mg via INTRAVENOUS
  Filled 2013-01-17: qty 1

## 2013-01-17 MED ORDER — HYDROMORPHONE HCL PF 2 MG/ML IJ SOLN
2.0000 mg | Freq: Once | INTRAMUSCULAR | Status: AC
  Administered 2013-01-17: 2 mg via INTRAVENOUS
  Filled 2013-01-17: qty 1

## 2013-01-17 MED ORDER — FAMOTIDINE 20 MG PO TABS
20.0000 mg | ORAL_TABLET | ORAL | Status: DC | PRN
  Filled 2013-01-17: qty 1

## 2013-01-17 MED ORDER — MAGIC MOUTHWASH
5.0000 mL | ORAL | Status: DC
  Administered 2013-01-17 – 2013-01-21 (×11): 5 mL via ORAL
  Filled 2013-01-17 (×33): qty 5

## 2013-01-17 MED ORDER — SERTRALINE HCL 100 MG PO TABS
100.0000 mg | ORAL_TABLET | Freq: Every day | ORAL | Status: DC
  Administered 2013-01-18 – 2013-01-22 (×5): 100 mg via ORAL
  Filled 2013-01-17 (×5): qty 1

## 2013-01-17 MED ORDER — HYDROMORPHONE HCL PF 1 MG/ML IJ SOLN: 1.0000 mg | INTRAMUSCULAR | Status: DC | PRN

## 2013-01-17 MED ORDER — PREDNISONE 10 MG PO TABS: 20.0000 mg | ORAL_TABLET | Freq: Every day | ORAL | Status: DC

## 2013-01-17 MED ORDER — HEPARIN SODIUM (PORCINE) 5000 UNIT/ML IJ SOLN
5000.0000 [IU] | Freq: Three times a day (TID) | INTRAMUSCULAR | Status: DC
  Administered 2013-01-17 – 2013-01-22 (×13): 5000 [IU] via SUBCUTANEOUS
  Filled 2013-01-17 (×17): qty 1

## 2013-01-17 NOTE — ED Provider Notes (Signed)
History     CSN: [id number]  Arrival date & time 01/17/13  1629   First MD Initiated Contact with Patient 01/17/13 1657      Chief Complaint  Patient presents with  . Back Pain    (Consider location/radiation/quality/duration/timing/severity/associated sxs/prior treatment) HPI 72 y.o. Female with history of back pain from scoliosis and a lumbar disc rupture with "bulging discs" and degenerative disc disease with chronic pain usually manage by patient and Dr. Clent Ridges.  Dr. Venetia Maxon is neurosurgeon.  Wednesday low back and left leg became more painful.  No known injury.  Present on awakening on Wednesday.  Started on left iand in groin and down to lateral knee.  No weakness, bowel or bladder control, or loss of sensation.  Patient walkes on own but uses cane outside.  She has been using walker because it hurts when she tries to move.  Took percocet with some relief but not complete.  Takes percocet 3-4 per day for chronic pain.  No new meds with this pain.   Past Medical History  Diagnosis Date  . Osteoarthritis   . Allergic rhinitis   . HLD (hyperlipidemia)   . HTN (hypertension)   . GERD (gastroesophageal reflux disease)   . Diverticulosis     hx  . Gallstones   . PUD (peptic ulcer disease)     duodenal 1980s  . Hiatal hernia   . Chronic back pain     sees Dr. Odette Fraction at [facility]   . COPD (chronic obstructive pulmonary disease)   . Blood transfusion     Past Surgical History  Procedure Laterality Date  . Lumbar disc surgery  1986  . Cervical laminectomy  8/07    Dr. Venetia Maxon  . L shoulder surgery  2007    Dr. Teressa Senter    Family History  Problem Relation Age of Onset  . Colon cancer Neg Hx     History  Substance Use Topics  . Smoking status: Current Every Day Smoker -- 0.50 packs/day    Types: Cigarettes  . Smokeless tobacco: Never Used  . Alcohol Use: No    OB History   Grav Para Term Preterm Abortions TAB SAB Ect Mult Living                  Review  of Systems  All other systems reviewed and are negative.    Allergies  Codeine and Penicillins  Home Medications   Current Outpatient Rx  Name  Route  Sig  Dispense  Refill  . albuterol (PROVENTIL HFA) 108 (90 BASE) MCG/ACT inhaler   Inhalation   Inhale 2 puffs into the lungs every 4 (four) hours as needed for wheezing or shortness of breath.   1 Inhaler   11   . ALPRAZolam (XANAX) 1 MG tablet   Oral   Take 1 tablet (1 mg total) by mouth 2 (two) times daily as needed for sleep or anxiety.   60 tablet   5   . Alum & Mag Hydroxide-Simeth (MAGIC MOUTHWASH) SOLN   Oral   Take 5 mLs by mouth every 4 (four) hours.   300 mL   5   . aspirin 81 MG EC tablet   Oral   Take 81 mg by mouth daily.           Marland Kitchen atenolol (TENORMIN) 25 MG tablet   Oral   Take 25 mg by mouth daily.         . cetirizine-pseudoephedrine (ZYRTEC-D)  5-120 MG per tablet   Oral   Take 1 tablet by mouth 2 (two) times daily.         . Cholecalciferol (VITAMIN D3) 2000 UNITS TABS   Oral   Take by mouth daily.         . diclofenac sodium (VOLTAREN) 1 % GEL   Topical   Apply 1 application topically every 6 (six) hours as needed (knee pain ).   100 g   11   . methocarbamol (ROBAXIN) 750 MG tablet   Oral   Take 750 mg by mouth 4 (four) times daily.         . Multiple Vitamin (MULTIVITAMIN) tablet   Oral   Take 1 tablet by mouth daily.         Marland Kitchen oxyCODONE-acetaminophen (PERCOCET) 10-325 MG per tablet   Oral   Take 1 tablet by mouth every 4 (four) hours as needed for pain.   150 tablet   0   . ranitidine (ZANTAC) 150 MG capsule   Oral   Take 150 mg by mouth as needed. Heartburn         . sertraline (ZOLOFT) 100 MG tablet   Oral   Take 100 mg by mouth daily.           BP 134/56  Pulse 79  Temp(Src) 98.2 F (36.8 C) (Oral)  Resp 18  SpO2 92%  Physical Exam  Nursing note and vitals reviewed. Constitutional: She is oriented to person, place, and time. She appears  well-developed and well-nourished.  HENT:  Head: Normocephalic and atraumatic.  Eyes: Conjunctivae and EOM are normal. Pupils are equal, round, and reactive to light.  Neck: Normal range of motion. Neck supple.  Cardiovascular: Normal rate, regular rhythm and normal heart sounds.   Pulmonary/Chest: Effort normal and breath sounds normal.  Abdominal: Soft. Bowel sounds are normal.  Musculoskeletal:  Left hip ttp but full arom, tender over left buttock.  No ttp in low back.   Neurological: She is alert and oriented to person, place, and time. She has normal strength and normal reflexes. No sensory deficit. She displays a negative Romberg sign. GCS eye subscore is 4. GCS verbal subscore is 5. GCS motor subscore is 6.    ED Course  Procedures (including critical care time)  Labs Reviewed - No data to display No results found.   No diagnosis found.  Results for orders placed during the hospital encounter of 01/17/13  CBC WITH DIFFERENTIAL      Result Value Range   WBC 8.6  4.0 - 10.5 K/uL   RBC 4.19  3.87 - 5.11 MIL/uL   Hemoglobin 13.6  12.0 - 15.0 g/dL   HCT 16.1  09.6 - 04.5 %   MCV 93.3  78.0 - 100.0 fL   MCH 32.5  26.0 - 34.0 pg   MCHC 34.8  30.0 - 36.0 g/dL   RDW 40.9  81.1 - 91.4 %   Platelets 255  150 - 400 K/uL   Neutrophils Relative 59  43 - 77 %   Neutro Abs 5.1  1.7 - 7.7 K/uL   Lymphocytes Relative 32  12 - 46 %   Lymphs Abs 2.7  0.7 - 4.0 K/uL   Monocytes Relative 7  3 - 12 %   Monocytes Absolute 0.6  0.1 - 1.0 K/uL   Eosinophils Relative 2  0 - 5 %   Eosinophils Absolute 0.2  0.0 - 0.7 K/uL   Basophils Relative  0  0 - 1 %   Basophils Absolute 0.0  0.0 - 0.1 K/uL  BASIC METABOLIC PANEL      Result Value Range   Sodium 137  135 - 145 mEq/L   Potassium 3.9  3.5 - 5.1 mEq/L   Chloride 101  96 - 112 mEq/L   CO2 24  19 - 32 mEq/L   Glucose, Bld 99  70 - 99 mg/dL   BUN 12  6 - 23 mg/dL   Creatinine, Ser 4.09 (*) 0.50 - 1.10 mg/dL   Calcium 9.3  8.4 - 81.1 mg/dL    GFR calc non Af Amer >90  >90 mL/min   GFR calc Af Amer >90  >90 mL/min  URINALYSIS, ROUTINE W REFLEX MICROSCOPIC      Result Value Range   Color, Urine YELLOW  YELLOW   APPearance CLOUDY (*) CLEAR   Specific Gravity, Urine 1.022  1.005 - 1.030   pH 5.5  5.0 - 8.0   Glucose, UA NEGATIVE  NEGATIVE mg/dL   Hgb urine dipstick NEGATIVE  NEGATIVE   Bilirubin Urine NEGATIVE  NEGATIVE   Ketones, ur NEGATIVE  NEGATIVE mg/dL   Protein, ur NEGATIVE  NEGATIVE mg/dL   Urobilinogen, UA 0.2  0.0 - 1.0 mg/dL   Nitrite NEGATIVE  NEGATIVE   Leukocytes, UA SMALL (*) NEGATIVE  URINE MICROSCOPIC-ADD ON      Result Value Range   Squamous Epithelial / LPF FEW (*) RARE   WBC, UA 3-6  <3 WBC/hpf   Bacteria, UA FEW (*) RARE   Urine-Other MUCOUS PRESENT      Ct Abdomen Pelvis Wo Contrast  01/17/2013  *RADIOLOGY REPORT*  Clinical Data: Increasing acute on chronic back pain  CT ABDOMEN AND PELVIS WITHOUT CONTRAST  Technique:  Multidetector CT imaging of the abdomen and pelvis was performed following the standard protocol without intravenous contrast.  Comparison: Concurrently obtained CT scan of the lumbar spine; prior MRI lumbar spine 03/20/2012; prior CT abdomen/pelvis 05/08/2008  Findings:  Lower Chest:  Mild atelectasis in the right lower lobe.  The lung bases are otherwise clear.  The visualized cardiac structures are within normal limits for size.  No pericardial effusion.  There is a small hiatal hernia.  Abdomen: Unenhanced CT was performed per clinician order.  Lack of IV contrast limits sensitivity and specificity, especially for evaluation of abdominal/pelvic solid viscera.  Within these limitations, unremarkable CT appearance of the stomach, duodenum, spleen, adrenal glands, pancreas and liver.  The gallbladder is surgically absent.  No significant biliary ductal dilatation.  Exophytic cystic lesion from the upper pole of the right kidney is incompletely characterized in the absence of intravenous  contrast material.  However, it remains unchanged in size and configuration dating back to June 2009 and is therefore likely a benign cyst.  No nephrolithiasis or hydronephrosis.  Calcifications in the right renal hilum are likely vascular.  Normal-caliber large and small bowel throughout the abdomen.  No bowel obstruction.  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  No evidence of active inflammation.  Normal appendix in the right lower quadrant.  The terminal ileum is unremarkable.  A 9 mm peripherally calcified structure in the pericolonic fat adjacent to the sigmoid colon in the left lower quadrant may represent the sequela of prior epiploic appendagitis.  No free fluid or suspicious adenopathy.  Pelvis: Normal bladder, uterus and adnexa.  No free fluid or adenopathy.  Bones: No acute fracture or aggressive appearing lytic  or blastic osseous lesion.  There is dextroconvex scoliosis of the lumbar spine and multilevel degenerative changes.  Please see dedicated CT scan of the lumbar spine for more detail.  Vascular: Limited evaluation in the absence of intravenous contrast material.  Scattered atherosclerotic vascular calcifications without aneurysmal dilatation.  IMPRESSION:  1.  No acute intra-abdominal or pelvic abnormality to explain the patient's clinical symptoms of back pain. 2.  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  Atherosclerotic vascular disease   Original Report Authenticated By: Malachy Moan, M.D.    Dg Hip Complete Left  01/17/2013  *RADIOLOGY REPORT*  Clinical Data: Left hip pain times 2 days, no known injury  LEFT HIP - COMPLETE 2+ VIEW  Comparison: Prior radiographs of the left hip 03/28/2012  Findings: The bones appear mildly osteopenic.  No evidence of acute fracture, or malalignment.  There is mild degenerative arthritis of both hip joints without significant interval progression compared to prior. Visualized  bowel gas pattern is unremarkable. Degenerative changes are noted in the lower lumbar spine.  IMPRESSION:  1.  No acute findings in the pelvis or left hip and no significant interval change compared to 03/28/2012 2.  Mild osteoarthritis of the bilateral hips 3.  Osteopenia   Original Report Authenticated By: Malachy Moan, M.D.    Ct Lumbar Spine Wo Contrast  01/17/2013  *RADIOLOGY REPORT*  Clinical Data: Increasing acute on chronic back pain  CT LUMBAR SPINE WITHOUT CONTRAST  Technique:  Multidetector CT imaging of the lumbar spine was performed without intravenous contrast administration. Multiplanar CT image reconstructions were also generated.  Comparison: Concurrently obtained CT scan of the abdomen and pelvis; prior MRI of the lumbar spine 03/20/2012  Findings: Degenerative dextroconvex scoliosis centered at L2-L3 there is no evidence of acute fracture, malalignment, or aggressive lytic or blastic osseous lesion.  Multilevel degenerative disc disease is noted.  No destructive change, or significant interval progression of disc space narrowing compared to the prior study to suggest an underlying diskitis.  Degenerative changes are most significant at L1-L2 and L5-S1.  Prior left laminectomy at L5-S1. There is multilevel facet arthropathy more prominent on the right than the left.  Levels of greatest severity are L4-L5 and L5-S1.  IMPRESSION:  1.  No acute fracture, malalignment or aggressive lytic or blastic osseous lesion.  2.  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   Original Report Authenticated By: Malachy Moan, M.D.    MDM  Patient with normal neurologic exam.  Patient with normal ct ls spine, pelvis, hip.  Patient advised of neurologic symptoms to recheck.  Patient states that she continues to have  severe pain has been unable to control this at home. She states that she is usually admitted for this pain. She is tearful at her daughter has arrived and states that she has been unable to control her pain at home for 4 days. She states she's been unable to sleep and has great difficulty moving about due to pain. I will discuss the patient's care with the hospitalist and admitted for pain control.        Hilario Quarry, MD 01/17/13 2200

## 2013-01-17 NOTE — ED Notes (Signed)
Per EMS:  Since Wednesday, pt has had lower back pain that has increased in severity over past 3 days.  This is a chronic condition d/t bulging disc, sciatic and scoliosis. VS: 130/884, 80 HR and 18 Resp.  8/10 pain reported to EMS.  Two Percocet (mg unknown) taken at 1515 without much relief.  Pain is worse with movement and activity.  Pt can bear her weight, but is very painful.  Pt requested family not to come to hospital due to weather.

## 2013-01-17 NOTE — H&P (Signed)
[facility] History and Physical  KENSIE SUSMAN ZOX:[id number] DOB: 1941-03-24 DOA: 01/17/2013  Referring physician: ED PCP: Nelwyn Salisbury, MD  Specialists: None  Chief Complaint: Back pain  HPI: Angelica [name] is a 72 y.o. female who presents with c/o intractable back and leg pain involving her LLE.  Her pain is worse when she tries to sit down.  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  Pain began to get worse on wed and has persisted since that time.  Walking the leg also makes it worse.  Took home percocet with some but incomplete relief.  In the ED back pain was controlled with IV dilaudid and she was given a dose of solumedrol, hospitalist has been asked to admit for pain control.  Review of Systems: 12 systems reviewed and otherwise negative.  Past Medical History  Diagnosis Date  . Osteoarthritis   . Allergic rhinitis   . HLD (hyperlipidemia)   . HTN (hypertension)   . GERD (gastroesophageal reflux disease)   . Diverticulosis     hx  . Gallstones   . PUD (peptic ulcer disease)     duodenal 1980s  . Hiatal hernia   . Chronic back pain     sees Dr. Odette Fraction at [facility]   . COPD (chronic obstructive pulmonary disease)   . Blood transfusion    Past Surgical History  Procedure Laterality Date  . Lumbar disc surgery  1986  . Cervical laminectomy  8/07    Dr. Venetia Maxon  . L shoulder surgery  2007    Dr. Teressa Senter   Social History:  reports that she has been smoking Cigarettes.  She has been smoking about 0.50 packs per day. She has never used smokeless tobacco. She reports that she does not drink alcohol or use illicit drugs.   Allergies  Allergen Reactions  . Codeine Itching  . Penicillins Hives    Family History  Problem Relation Age of Onset  . Colon cancer Neg Hx     Prior to Admission medications   Medication Sig Start Date End Date Taking? Authorizing Provider  albuterol (PROVENTIL  HFA) 108 (90 BASE) MCG/ACT inhaler Inhale 2 puffs into the lungs every 4 (four) hours as needed for wheezing or shortness of breath. 12/20/12  Yes Nelwyn Salisbury, MD  ALPRAZolam [name]) 1 MG tablet Take 1 tablet (1 mg total) by mouth 2 (two) times daily as needed for sleep or anxiety. 08/30/12  Yes Nelwyn Salisbury, MD  Alum & Mag Hydroxide-Simeth (MAGIC MOUTHWASH) SOLN Take 5 mLs by mouth every 4 (four) hours. 01/24/12  Yes Nelwyn Salisbury, MD  aspirin 81 MG EC tablet Take 81 mg by mouth daily.     Yes Historical Provider, MD  atenolol (TENORMIN) 25 MG tablet Take 25 mg by mouth daily.   Yes Historical Provider, MD  cetirizine-pseudoephedrine (ZYRTEC-D) 5-120 MG per tablet Take 1 tablet by mouth 2 (two) times daily.   Yes Historical Provider, MD  Cholecalciferol (VITAMIN D3) 2000 UNITS TABS Take by mouth daily.   Yes Historical Provider, MD  diclofenac sodium (VOLTAREN) 1 % GEL Apply 1 application topically every 6 (six) hours as needed (knee pain ). 01/31/12  Yes Nelwyn Salisbury, MD  methocarbamol (ROBAXIN) 750 MG tablet Take 750 mg by mouth 4 (four) times daily.   Yes Historical Provider, MD  Multiple Vitamin (MULTIVITAMIN) tablet Take 1 tablet by mouth daily.  Yes Historical Provider, MD  oxyCODONE-acetaminophen (PERCOCET) 10-325 MG per tablet Take 1 tablet by mouth every 4 (four) hours as needed for pain. 01/14/13  Yes Nelwyn Salisbury, MD  ranitidine (ZANTAC) 150 MG capsule Take 150 mg by mouth as needed. Heartburn   Yes Historical Provider, MD  sertraline (ZOLOFT) 100 MG tablet Take 100 mg by mouth daily.   Yes Historical Provider, MD  predniSONE (DELTASONE) 10 MG tablet Take 2 tablets (20 mg total) by mouth daily. 01/17/13   Hilario Quarry, MD   Physical Exam: Filed Vitals:   01/17/13 1644 01/17/13 1910  BP: 134/56 123/62  Pulse: 79 77  Temp: 98.2 F (36.8 C) 98.4 F (36.9 C)  TempSrc: Oral   Resp: 18 16  SpO2: 92% 97%    General:  NAD, resting comfortably in bed Eyes: PEERLA EOMI ENT: mucous  membranes moist Neck: supple w/o JVD Cardiovascular: RRR w/o MRG Respiratory: CTA B Abdomen: soft, nt, nd, bs+ Skin: no rash nor lesion Musculoskeletal: left hip TTP, and tender over left buttock, no tenderness in low back Psychiatric: normal tone and affect Neurologic: AAOx3, grossly non-focal  Labs on Admission:  Basic Metabolic Panel:  Recent Labs Lab 01/17/13 1745  NA 137  K 3.9  CL 101  CO2 24  GLUCOSE 99  BUN 12  CREATININE 0.48*  CALCIUM 9.3   Liver Function Tests: No results found for this basename: AST, ALT, ALKPHOS, BILITOT, PROT, ALBUMIN,  in the last 168 hours No results found for this basename: LIPASE, AMYLASE,  in the last 168 hours No results found for this basename: AMMONIA,  in the last 168 hours CBC:  Recent Labs Lab 01/17/13 1745  WBC 8.6  NEUTROABS 5.1  HGB 13.6  HCT 39.1  MCV 93.3  PLT 255   Cardiac Enzymes: No results found for this basename: CKTOTAL, CKMB, CKMBINDEX, TROPONINI,  in the last 168 hours  BNP (last 3 results) No results found for this basename: PROBNP,  in the last 8760 hours CBG: No results found for this basename: GLUCAP,  in the last 168 hours  Radiological Exams on Admission: Ct Abdomen Pelvis Wo Contrast  01/17/2013  *RADIOLOGY REPORT*  Clinical Data: Increasing acute on chronic back pain  CT ABDOMEN AND PELVIS WITHOUT CONTRAST  Technique:  Multidetector CT imaging of the abdomen and pelvis was performed following the standard protocol without intravenous contrast.  Comparison: Concurrently obtained CT scan of the lumbar spine; prior MRI lumbar spine 03/20/2012; prior CT abdomen/pelvis 05/08/2008  Findings:  Lower Chest:  Mild atelectasis in the right lower lobe.  The lung bases are otherwise clear.  The visualized cardiac structures are within normal limits for size.  No pericardial effusion.  There is a small hiatal hernia.  Abdomen: Unenhanced CT was performed per clinician order.  Lack of IV contrast limits sensitivity and  specificity, especially for evaluation of abdominal/pelvic solid viscera.  Within these limitations, unremarkable CT appearance of the stomach, duodenum, spleen, adrenal glands, pancreas and liver.  The gallbladder is surgically absent.  No significant biliary ductal dilatation.  Exophytic cystic lesion from the upper pole of the right kidney is incompletely characterized in the absence of intravenous contrast material.  However, it remains unchanged in size and configuration dating back to June 2009 and is therefore likely a benign cyst.  No nephrolithiasis or hydronephrosis.  Calcifications in the right renal hilum are likely vascular.  Normal-caliber large and small bowel throughout the abdomen.  No bowel obstruction.  Moderate  colonic diverticular disease predominantly involving the descending and sigmoid colon.  No evidence of active inflammation.  Normal appendix in the right lower quadrant.  The terminal ileum is unremarkable.  A 9 mm peripherally calcified structure in the pericolonic fat adjacent to the sigmoid colon in the left lower quadrant may represent the sequela of prior epiploic appendagitis.  No free fluid or suspicious adenopathy.  Pelvis: Normal bladder, uterus and adnexa.  No free fluid or adenopathy.  Bones: No acute fracture or aggressive appearing lytic or blastic osseous lesion.  There is dextroconvex scoliosis of the lumbar spine and multilevel degenerative changes.  Please see dedicated CT scan of the lumbar spine for more detail.  Vascular: Limited evaluation in the absence of intravenous contrast material.  Scattered atherosclerotic vascular calcifications without aneurysmal dilatation.  IMPRESSION:  1.  No acute intra-abdominal or pelvic abnormality to explain the patient's clinical symptoms of back pain. 2.  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  Atherosclerotic vascular disease   Original Report Authenticated By: Malachy Moan, M.D.    Dg Hip Complete  Left  01/17/2013  *RADIOLOGY REPORT*  Clinical Data: Left hip pain times 2 days, no known injury  LEFT HIP - COMPLETE 2+ VIEW  Comparison: Prior radiographs of the left hip 03/28/2012  Findings: The bones appear mildly osteopenic.  No evidence of acute fracture, or malalignment.  There is mild degenerative arthritis of both hip joints without significant interval progression compared to prior. Visualized bowel gas pattern is unremarkable. Degenerative changes are noted in the lower lumbar spine.  IMPRESSION:  1.  No acute findings in the pelvis or left hip and no significant interval change compared to 03/28/2012 2.  Mild osteoarthritis of the bilateral hips 3.  Osteopenia   Original Report Authenticated By: Malachy Moan, M.D.    Ct Lumbar Spine Wo Contrast  01/17/2013  *RADIOLOGY REPORT*  Clinical Data: Increasing acute on chronic back pain  CT LUMBAR SPINE WITHOUT CONTRAST  Technique:  Multidetector CT imaging of the lumbar spine was performed without intravenous contrast administration. Multiplanar CT image reconstructions were also generated.  Comparison: Concurrently obtained CT scan of the abdomen and pelvis; prior MRI of the lumbar spine 03/20/2012  Findings: Degenerative dextroconvex scoliosis centered at L2-L3 there is no evidence of acute fracture, malalignment, or aggressive lytic or blastic osseous lesion.  Multilevel degenerative disc disease is noted.  No destructive change, or significant interval progression of disc space narrowing compared to the prior study to suggest an underlying diskitis.  Degenerative changes are most significant at L1-L2 and L5-S1.  Prior left laminectomy at L5-S1. There is multilevel facet arthropathy more prominent on the right than the left.  Levels of greatest severity are L4-L5 and L5-S1.  IMPRESSION:  1.  No acute fracture, malalignment or aggressive lytic or blastic osseous lesion.  2.  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   Original Report Authenticated By: Malachy Moan, M.D.     EKG: Independently reviewed.  Assessment/Plan Active Problems:   Back pain, subacute   Spinal stenosis   1. Back pain - likely sciatica vs flare up of pain due to spinal stenosis, pain control with IV dilaudid, IV solumedrol currently ordered at 40mg  q12h, MRI lumbar spine ordered.  Admitted for observation 2. H/o HTN, gerd - continue home meds    Code Status: Full Code (must  indicate code status--if unknown or must be presumed, indicate so) Family Communication: no family in room (indicate person spoken with, if applicable, with phone number if by telephone) Disposition Plan: Admit to obs (indicate anticipated LOS)  Time spent: 70 min  GARDNER, JARED M. [facility] Pager [phone]  If 7PM-7AM, please contact night-coverage [email or web address] Password [facility] 01/17/2013, 11:08 PM

## 2013-01-17 NOTE — ED Notes (Signed)
Bed:WA05<BR> Expected date:<BR> Expected time:<BR> Means of arrival:<BR> Comments:<BR> ems

## 2013-01-18 ENCOUNTER — Encounter (HOSPITAL_COMMUNITY): Payer: Self-pay | Admitting: *Deleted

## 2013-01-18 ENCOUNTER — Observation Stay (HOSPITAL_COMMUNITY): Payer: Medicare Other

## 2013-01-18 DIAGNOSIS — K5900 Constipation, unspecified: Secondary | ICD-10-CM | POA: Diagnosis not present

## 2013-01-18 DIAGNOSIS — R52 Pain, unspecified: Secondary | ICD-10-CM | POA: Diagnosis not present

## 2013-01-18 DIAGNOSIS — R1084 Generalized abdominal pain: Secondary | ICD-10-CM | POA: Diagnosis not present

## 2013-01-18 DIAGNOSIS — M5137 Other intervertebral disc degeneration, lumbosacral region: Secondary | ICD-10-CM | POA: Diagnosis not present

## 2013-01-18 DIAGNOSIS — M47817 Spondylosis without myelopathy or radiculopathy, lumbosacral region: Secondary | ICD-10-CM | POA: Diagnosis not present

## 2013-01-18 DIAGNOSIS — M48061 Spinal stenosis, lumbar region without neurogenic claudication: Secondary | ICD-10-CM | POA: Diagnosis not present

## 2013-01-18 LAB — GLUCOSE, CAPILLARY

## 2013-01-18 LAB — CBC
HCT: 39.2 % (ref 36.0–46.0)
Hemoglobin: 13.3 g/dL (ref 12.0–15.0)
RDW: 12.4 % (ref 11.5–15.5)
WBC: 8 10*3/uL (ref 4.0–10.5)

## 2013-01-18 LAB — BASIC METABOLIC PANEL
BUN: 14 mg/dL (ref 6–23)
Chloride: 102 mEq/L (ref 96–112)
GFR calc Af Amer: >90 mL/min (ref >90)
Potassium: 4.2 mEq/L (ref 3.5–5.1)
Sodium: 137 mEq/L (ref 135–145)

## 2013-01-18 MED ORDER — PANTOPRAZOLE SODIUM 40 MG PO TBEC
40.0000 mg | DELAYED_RELEASE_TABLET | Freq: Every day | ORAL | Status: DC
  Administered 2013-01-18 – 2013-01-22 (×5): 40 mg via ORAL
  Filled 2013-01-18 (×4): qty 1

## 2013-01-18 MED ORDER — ONDANSETRON HCL 4 MG/2ML IJ SOLN
4.0000 mg | Freq: Four times a day (QID) | INTRAMUSCULAR | Status: DC | PRN
  Administered 2013-01-18: 4 mg via INTRAVENOUS
  Filled 2013-01-18: qty 2

## 2013-01-18 MED ORDER — DOCUSATE SODIUM 100 MG PO CAPS
100.0000 mg | ORAL_CAPSULE | Freq: Two times a day (BID) | ORAL | Status: DC
  Administered 2013-01-18 – 2013-01-22 (×7): 100 mg via ORAL
  Filled 2013-01-18 (×13): qty 1

## 2013-01-18 MED ORDER — METHYLPREDNISOLONE SODIUM SUCC 40 MG IJ SOLR
40.0000 mg | Freq: Two times a day (BID) | INTRAMUSCULAR | Status: AC
  Administered 2013-01-18 – 2013-01-20 (×6): 40 mg via INTRAVENOUS
  Filled 2013-01-18 (×6): qty 1

## 2013-01-18 MED ORDER — HYDROMORPHONE HCL PF 2 MG/ML IJ SOLN
1.0000 mg | INTRAMUSCULAR | Status: DC | PRN
  Administered 2013-01-18 – 2013-01-19 (×7): 2 mg via INTRAVENOUS
  Administered 2013-01-19: 1 mg via INTRAVENOUS
  Administered 2013-01-19 – 2013-01-20 (×7): 2 mg via INTRAVENOUS
  Administered 2013-01-20: 1 mg via INTRAVENOUS
  Administered 2013-01-20 – 2013-01-21 (×5): 2 mg via INTRAVENOUS
  Filled 2013-01-18 (×20): qty 1

## 2013-01-18 MED ORDER — GADOBENATE DIMEGLUMINE 529 MG/ML IV SOLN
10.0000 mL | Freq: Once | INTRAVENOUS | Status: AC | PRN
  Administered 2013-01-18: 10 mL via INTRAVENOUS

## 2013-01-18 MED ORDER — SENNA 8.6 MG PO TABS
2.0000 | ORAL_TABLET | Freq: Every day | ORAL | Status: DC
  Administered 2013-01-18 – 2013-01-20 (×2): 17.2 mg via ORAL
  Filled 2013-01-18 (×2): qty 2

## 2013-01-18 NOTE — Progress Notes (Signed)
Pt reports using a four wheel walker at home recently.  Pt states she has been unable to perform ADLs at home by herself this week due to severe lower back and left leg pain.  Her niece lives at home with her and helps her with ADLs.

## 2013-01-18 NOTE — Progress Notes (Signed)
[facility] PROGRESS NOTE  Angelica [name] ZHY:[id number] DOB: 01-21-1941 DOA: 01/17/2013 PCP: Nelwyn Salisbury, MD Brief history 72 year old female with a history of spinal stenosis and chronic back pain presents with one week history of progressively worsening lumbar back pain. Since Wednesday, 01/15/2013, the patient has had to use up to 8 Percocets per day. Normally, the patient uses 4-5 Percocets daily. Even with the increased number of Percocets, the patient states that the pain was uncontrolled. She denies any recent falls or injuries. She states that the pain shoots into her left leg down to her knee. She denies any falls or syncope. However, because of the pain she has had to use a walker for the past few days. She denies any bowel or bladder incontinence. There's been no fevers or chills. The patient never went to get any spinal injections as indicated previously. She stated that her insurance would not pay for the injections. Since admission, intravenous hydromorphone has provided the patient with good relief of her pain. Assessment/Plan: Intractable back pain/uncontrolled pain -Requiring intravenous hydromorphone -continue IV hydromorphone -MRI lumbar spine -Continue Solu-Medrol IV -Continue Robaxin Spinal stenosis -CT lumbar spine with no interval progression of disc space narrowing, no discitis; DJD most severe at L1-L2, L5-S1 -MRI lumbar spine as discussed Abdominal pain -Likely constipation -CT abdomen pelvis revealed no acute abnormalities Hypertension -Continue atenolol Depression -Continue Zoloft Constipation -Colace and Senokot   Family Communication:   Pt at beside Disposition Plan:   Home when medically stable       Procedures/Studies: Ct Abdomen Pelvis Wo Contrast  01/17/2013  *RADIOLOGY REPORT*  Clinical Data: Increasing acute on chronic back pain  CT ABDOMEN AND PELVIS WITHOUT CONTRAST  Technique:  Multidetector CT imaging of the abdomen and pelvis was  performed following the standard protocol without intravenous contrast.  Comparison: Concurrently obtained CT scan of the lumbar spine; prior MRI lumbar spine 03/20/2012; prior CT abdomen/pelvis 05/08/2008  Findings:  Lower Chest:  Mild atelectasis in the right lower lobe.  The lung bases are otherwise clear.  The visualized cardiac structures are within normal limits for size.  No pericardial effusion.  There is a small hiatal hernia.  Abdomen: Unenhanced CT was performed per clinician order.  Lack of IV contrast limits sensitivity and specificity, especially for evaluation of abdominal/pelvic solid viscera.  Within these limitations, unremarkable CT appearance of the stomach, duodenum, spleen, adrenal glands, pancreas and liver.  The gallbladder is surgically absent.  No significant biliary ductal dilatation.  Exophytic cystic lesion from the upper pole of the right kidney is incompletely characterized in the absence of intravenous contrast material.  However, it remains unchanged in size and configuration dating back to June 2009 and is therefore likely a benign cyst.  No nephrolithiasis or hydronephrosis.  Calcifications in the right renal hilum are likely vascular.  Normal-caliber large and small bowel throughout the abdomen.  No bowel obstruction.  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  No evidence of active inflammation.  Normal appendix in the right lower quadrant.  The terminal ileum is unremarkable.  A 9 mm peripherally calcified structure in the pericolonic fat adjacent to the sigmoid colon in the left lower quadrant may represent the sequela of prior epiploic appendagitis.  No free fluid or suspicious adenopathy.  Pelvis: Normal bladder, uterus and adnexa.  No free fluid or adenopathy.  Bones: No acute fracture or aggressive appearing lytic or blastic osseous lesion.  There is dextroconvex scoliosis of the lumbar spine and  multilevel degenerative  changes.  Please see dedicated CT scan of the lumbar spine for more detail.  Vascular: Limited evaluation in the absence of intravenous contrast material.  Scattered atherosclerotic vascular calcifications without aneurysmal dilatation.  IMPRESSION:  1.  No acute intra-abdominal or pelvic abnormality to explain the patient's clinical symptoms of back pain. 2.  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  Atherosclerotic vascular disease   Original Report Authenticated By: Malachy Moan, M.D.    Dg Hip Complete Left  01/17/2013  *RADIOLOGY REPORT*  Clinical Data: Left hip pain times 2 days, no known injury  LEFT HIP - COMPLETE 2+ VIEW  Comparison: Prior radiographs of the left hip 03/28/2012  Findings: The bones appear mildly osteopenic.  No evidence of acute fracture, or malalignment.  There is mild degenerative arthritis of both hip joints without significant interval progression compared to prior. Visualized bowel gas pattern is unremarkable. Degenerative changes are noted in the lower lumbar spine.  IMPRESSION:  1.  No acute findings in the pelvis or left hip and no significant interval change compared to 03/28/2012 2.  Mild osteoarthritis of the bilateral hips 3.  Osteopenia   Original Report Authenticated By: Malachy Moan, M.D.    Ct Lumbar Spine Wo Contrast  01/17/2013  *RADIOLOGY REPORT*  Clinical Data: Increasing acute on chronic back pain  CT LUMBAR SPINE WITHOUT CONTRAST  Technique:  Multidetector CT imaging of the lumbar spine was performed without intravenous contrast administration. Multiplanar CT image reconstructions were also generated.  Comparison: Concurrently obtained CT scan of the abdomen and pelvis; prior MRI of the lumbar spine 03/20/2012  Findings: Degenerative dextroconvex scoliosis centered at L2-L3 there is no evidence of acute fracture, malalignment, or aggressive lytic or blastic osseous lesion.  Multilevel degenerative disc disease is noted.  No destructive  change, or significant interval progression of disc space narrowing compared to the prior study to suggest an underlying diskitis.  Degenerative changes are most significant at L1-L2 and L5-S1.  Prior left laminectomy at L5-S1. There is multilevel facet arthropathy more prominent on the right than the left.  Levels of greatest severity are L4-L5 and L5-S1.  IMPRESSION:  1.  No acute fracture, malalignment or aggressive lytic or blastic osseous lesion.  2.  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   Original Report Authenticated By: Malachy Moan, M.D.          Subjective: Patient states that hydromorphone gives her a list 50% relief and lasts approximately 3-4 hours. She denies any fevers, chills, chest pain, shortness breath, vomiting, diarrhea, dysuria, hematuria. She hasn't constipation. She has some nausea.  Objective: Filed Vitals:   01/17/13 1644 01/17/13 1910 01/18/13 0029 01/18/13 0600  BP: 134/56 123/62 127/74 101/61  Pulse: 79 77 84 84  Temp: 98.2 F (36.8 C) 98.4 F (36.9 C) 97.6 F (36.4 C) 97.9 F (36.6 C)  TempSrc: Oral  Oral Oral  Resp: 18 16 18 18   Height:   5\' 3"  (1.6 m)   Weight:   53.524 kg (118 lb)   SpO2: 92% 97% 94% 90%    Intake/Output Summary (Last 24 hours) at 01/18/13 0836 Last data filed at 01/18/13 0600  Gross per 24 hour  Intake    100 ml  Output      0 ml  Net    100 ml   Weight change:  Exam:   General:  Pt is alert, follows commands appropriately, not in acute distress  HEENT: No icterus, No thrush,  [place]/AT  Cardiovascular: RRR, S1/S2, no rubs, no gallops  Respiratory: CTA bilaterally, no wheezing, no crackles, no rhonchi  Abdomen: Soft/+BS, mild tenderness to palpation left lower quadrant, non distended, no guarding  Extremities: No edema, No  lymphangitis, No petechiae, No rashes, no synovitis  Neurologic: Straight leg test negative bilateral; epicritic and neuropathic sensations intact in bilateral lower extremities; strength 4/5 bilateral lower extremities  Data Reviewed: Basic Metabolic Panel:  Recent Labs Lab 01/17/13 1745 01/18/13 0535  NA 137 137  K 3.9 4.2  CL 101 102  CO2 24 23  GLUCOSE 99 123*  BUN 12 14  CREATININE 0.48* 0.44*  CALCIUM 9.3 8.8   Liver Function Tests: No results found for this basename: AST, ALT, ALKPHOS, BILITOT, PROT, ALBUMIN,  in the last 168 hours No results found for this basename: LIPASE, AMYLASE,  in the last 168 hours No results found for this basename: AMMONIA,  in the last 168 hours CBC:  Recent Labs Lab 01/17/13 1745 01/18/13 0535  WBC 8.6 8.0  NEUTROABS 5.1  --   HGB 13.6 13.3  HCT 39.1 39.2  MCV 93.3 94.0  PLT 255 255   Cardiac Enzymes: No results found for this basename: CKTOTAL, CKMB, CKMBINDEX, TROPONINI,  in the last 168 hours BNP: No components found with this basename: POCBNP,  CBG:  Recent Labs Lab 01/18/13 0745  GLUCAP 112*    No results found for this or any previous visit (from the past 240 hour(s)).   Scheduled Meds: . aspirin EC  81 mg Oral Daily  . atenolol  25 mg Oral Daily  . docusate sodium  100 mg Oral BID  . heparin  5,000 Units Subcutaneous Q8H  .  HYDROmorphone (DILAUDID) injection  2 mg Intravenous Once  . loratadine  10 mg Oral Daily  . magic mouthwash  5 mL Oral Q4H  . methocarbamol  750 mg Oral QID  . methylPREDNISolone (SOLU-MEDROL) injection  40 mg Intravenous Q12H  . multivitamin with minerals  1 tablet Oral Daily  . pantoprazole  40 mg Oral Daily  . senna  2 tablet Oral Daily  . sertraline  100 mg Oral Daily   Continuous Infusions:    Angelica Osbourn, DO  [facility] Pager [phone]  If 7PM-7AM, please contact night-coverage [email or web address] Password [facility] 01/18/2013, 8:36 AM   LOS: 1 day

## 2013-01-19 DIAGNOSIS — F411 Generalized anxiety disorder: Secondary | ICD-10-CM | POA: Diagnosis not present

## 2013-01-19 DIAGNOSIS — R1011 Right upper quadrant pain: Secondary | ICD-10-CM | POA: Diagnosis not present

## 2013-01-19 DIAGNOSIS — R52 Pain, unspecified: Secondary | ICD-10-CM | POA: Diagnosis not present

## 2013-01-19 DIAGNOSIS — M48061 Spinal stenosis, lumbar region without neurogenic claudication: Secondary | ICD-10-CM | POA: Diagnosis not present

## 2013-01-19 LAB — BASIC METABOLIC PANEL
Calcium: 9.1 mg/dL (ref 8.4–10.5)
GFR calc Af Amer: >90 mL/min (ref >90)
GFR calc non Af Amer: >90 mL/min (ref >90)
Glucose, Bld: 128 mg/dL — ABNORMAL HIGH (ref 70–99)
Potassium: 4.1 mEq/L (ref 3.5–5.1)
Sodium: 134 mEq/L — ABNORMAL LOW (ref 135–145)

## 2013-01-19 LAB — GLUCOSE, CAPILLARY: Glucose-Capillary: 124 mg/dL — ABNORMAL HIGH (ref 70–99)

## 2013-01-19 LAB — URINE CULTURE

## 2013-01-19 MED ORDER — MORPHINE SULFATE ER 30 MG PO TBCR
30.0000 mg | EXTENDED_RELEASE_TABLET | Freq: Two times a day (BID) | ORAL | Status: DC
  Administered 2013-01-19 – 2013-01-20 (×2): 30 mg via ORAL
  Filled 2013-01-19 (×2): qty 1

## 2013-01-19 NOTE — Progress Notes (Addendum)
[facility] PROGRESS NOTE  Angelica [name] WGN:[id number] DOB: 01-13-1941 DOA: 01/17/2013 PCP: Nelwyn Salisbury, MD  Brief history  72 year old female with a history of spinal stenosis and chronic back pain presents with one week history of progressively worsening lumbar back pain. Since Wednesday, 01/15/2013, the patient has had to use up to 8 Percocets per day. Normally, the patient uses 4-5 Percocets daily. Even with the increased number of Percocets, the patient states that the pain was uncontrolled. She denies any recent falls or injuries. She states that the pain shoots into her left leg down to her knee. She denies any falls or syncope. However, because of the pain she has had to use a walker for the past few days. She denies any bowel or bladder incontinence. There's been no fevers or chills. The patient never went to get any spinal injections as indicated previously. She stated that her insurance would not pay for the injections. Since admission, intravenous hydromorphone has provided the patient with good relief of her pain.  Assessment/Plan:  Intractable back pain/uncontrolled pain  -Requiring intravenous hydromorphone  -continue IV hydromorphone  -MRI lumbar spine-shows progression of L4-5 spinal stenosis with bilateral foraminal encroachment -spoke with neurosurgery, Dr. Evalee Mutton surgical intervention necessary at this time -followup with Dr. Venetia Maxon in office -Continue Solu-Medrol IV  -Continue Robaxin  -add MS Contin -PT evaluation Spinal stenosis  -CT lumbar spine with no interval progression of disc space narrowing, no discitis; DJD most severe at L1-L2, L5-S1  -MRI lumbar spine as discussed  Abdominal pain  -Likely constipation  -CT abdomen pelvis revealed no acute abnormalities  Hypertension  -Continue atenolol  Depression  -Continue Zoloft  Constipation  -Colace and Senokot  Family Communication: Pt at beside  Disposition Plan: Home when medically  stable         Procedures/Studies: Ct Abdomen Pelvis Wo Contrast  01/17/2013  *RADIOLOGY REPORT*  Clinical Data: Increasing acute on chronic back pain  CT ABDOMEN AND PELVIS WITHOUT CONTRAST  Technique:  Multidetector CT imaging of the abdomen and pelvis was performed following the standard protocol without intravenous contrast.  Comparison: Concurrently obtained CT scan of the lumbar spine; prior MRI lumbar spine 03/20/2012; prior CT abdomen/pelvis 05/08/2008  Findings:  Lower Chest:  Mild atelectasis in the right lower lobe.  The lung bases are otherwise clear.  The visualized cardiac structures are within normal limits for size.  No pericardial effusion.  There is a small hiatal hernia.  Abdomen: Unenhanced CT was performed per clinician order.  Lack of IV contrast limits sensitivity and specificity, especially for evaluation of abdominal/pelvic solid viscera.  Within these limitations, unremarkable CT appearance of the stomach, duodenum, spleen, adrenal glands, pancreas and liver.  The gallbladder is surgically absent.  No significant biliary ductal dilatation.  Exophytic cystic lesion from the upper pole of the right kidney is incompletely characterized in the absence of intravenous contrast material.  However, it remains unchanged in size and configuration dating back to June 2009 and is therefore likely a benign cyst.  No nephrolithiasis or hydronephrosis.  Calcifications in the right renal hilum are likely vascular.  Normal-caliber large and small bowel throughout the abdomen.  No bowel obstruction.  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  No evidence of active inflammation.  Normal appendix in the right lower quadrant.  The terminal ileum is unremarkable.  A 9 mm peripherally calcified structure in the pericolonic fat adjacent to the sigmoid colon in the left lower quadrant may represent  the sequela of prior epiploic appendagitis.  No free  fluid or suspicious adenopathy.  Pelvis: Normal bladder, uterus and adnexa.  No free fluid or adenopathy.  Bones: No acute fracture or aggressive appearing lytic or blastic osseous lesion.  There is dextroconvex scoliosis of the lumbar spine and multilevel degenerative changes.  Please see dedicated CT scan of the lumbar spine for more detail.  Vascular: Limited evaluation in the absence of intravenous contrast material.  Scattered atherosclerotic vascular calcifications without aneurysmal dilatation.  IMPRESSION:  1.  No acute intra-abdominal or pelvic abnormality to explain the patient's clinical symptoms of back pain. 2.  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  Atherosclerotic vascular disease   Original Report Authenticated By: Malachy Moan, M.D.    Dg Hip Complete Left  01/17/2013  *RADIOLOGY REPORT*  Clinical Data: Left hip pain times 2 days, no known injury  LEFT HIP - COMPLETE 2+ VIEW  Comparison: Prior radiographs of the left hip 03/28/2012  Findings: The bones appear mildly osteopenic.  No evidence of acute fracture, or malalignment.  There is mild degenerative arthritis of both hip joints without significant interval progression compared to prior. Visualized bowel gas pattern is unremarkable. Degenerative changes are noted in the lower lumbar spine.  IMPRESSION:  1.  No acute findings in the pelvis or left hip and no significant interval change compared to 03/28/2012 2.  Mild osteoarthritis of the bilateral hips 3.  Osteopenia   Original Report Authenticated By: Malachy Moan, M.D.    Ct Lumbar Spine Wo Contrast  01/17/2013  *RADIOLOGY REPORT*  Clinical Data: Increasing acute on chronic back pain  CT LUMBAR SPINE WITHOUT CONTRAST  Technique:  Multidetector CT imaging of the lumbar spine was performed without intravenous contrast administration. Multiplanar CT image reconstructions were also generated.  Comparison: Concurrently obtained CT scan of the abdomen and pelvis;  prior MRI of the lumbar spine 03/20/2012  Findings: Degenerative dextroconvex scoliosis centered at L2-L3 there is no evidence of acute fracture, malalignment, or aggressive lytic or blastic osseous lesion.  Multilevel degenerative disc disease is noted.  No destructive change, or significant interval progression of disc space narrowing compared to the prior study to suggest an underlying diskitis.  Degenerative changes are most significant at L1-L2 and L5-S1.  Prior left laminectomy at L5-S1. There is multilevel facet arthropathy more prominent on the right than the left.  Levels of greatest severity are L4-L5 and L5-S1.  IMPRESSION:  1.  No acute fracture, malalignment or aggressive lytic or blastic osseous lesion.  2.  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   Original Report Authenticated By: Malachy Moan, M.D.    Mr Lumbar Spine W Wo Contrast  01/18/2013  *RADIOLOGY REPORT*  Clinical Data: Severe back pain with left groin and leg pain. Spinal stenosis.  MRI LUMBAR SPINE WITHOUT AND WITH CONTRAST  Technique:  Multiplanar and multiecho pulse sequences of the lumbar spine were obtained without and with intravenous contrast.  Contrast: 10mL MULTIHANCE GADOBENATE DIMEGLUMINE 529 MG/ML IV SOLN  Comparison:  CT lumbar 01/17/2013, MRI 03/20/2012.  Findings: Dextroscoliosis.  Negative for fracture or mass lesion. Hemangioma S1 vertebral body is unchanged.  Conus medullaris is normal and terminates at L1-2.  L1-2:  Disc degeneration and spondylosis.  Mild facet hypertrophy without significant spinal stenosis.  No change from prior MRI.  L2-3:  Disc degeneration and spondylosis  on the left with left foraminal narrowing.  Bilateral facet hypertrophy.  Mild narrowing of the spinal canal.  L3-4:  Disc degeneration and  spondylosis.  Bilateral facet hypertrophy.  Mild foraminal narrowing bilaterally.  Mild central canal stenosis.  L4-5:  Disc degeneration and spondylosis with bilateral facet hypertrophy.  Moderate to severe spinal stenosis has progressed since 03/20/2012.  There is foraminal encroachment bilaterally.  L5-S1:  Prior laminectomy on the left.  Disc degeneration and spondylosis.  Right foraminal encroachment due to spurring and facet hypertrophy.  IMPRESSION: Mild spinal stenosis L2-3 and L3-4.  Progression of spinal stenosis at L4-5 which is now moderate to severe.  Postop laminectomy left L5-S1.  There is right foraminal encroachment due to spurring.   Original Report Authenticated By: Janeece Riggers, M.D.          Subjective: Patient states that left leg pain and back pain are a little better, but states that she still has significant pain with weightbearing and needs assistance. Denies any fevers, chills, chest pain, shortness breath, nausea, vomiting, diarrhea, abdominal pain, dysuria, hematuria.  Objective: Filed Vitals:   01/18/13 1535 01/18/13 2200 01/19/13 0600 01/19/13 1545  BP: 107/62 104/56 116/57 90/52  Pulse: 77 86 72 66  Temp: 98.3 F (36.8 C) 98.1 F (36.7 C) 98.5 F (36.9 C) 98.4 F (36.9 C)  TempSrc: Oral Oral Oral Oral  Resp: 18 18 18 18   Height:      Weight:      SpO2: 94% 96% 91% 92%   No intake or output data in the 24 hours ending 01/19/13 1644 Weight change:  Exam:   General:  Pt is alert, follows commands appropriately, not in acute distress  HEENT: No icterus, No thrush,[place]/AT  Cardiovascular: RRR, S1/S2, no rubs, no gallops  Respiratory: CTA bilaterally, no wheezing, no crackles, no rhonchi  Abdomen: Soft/+BS, non tender, non distended, no guarding  Extremities: No edema, No lymphangitis, No petechiae, No rashes; positive left leg straight leg test  Data Reviewed: Basic Metabolic Panel:  Recent Labs Lab 01/17/13 1745 01/18/13 0535 01/19/13 0510   NA 137 137 134*  K 3.9 4.2 4.1  CL 101 102 99  CO2 24 23 26   GLUCOSE 99 123* 128*  BUN 12 14 16   CREATININE 0.48* 0.44* 0.47*  CALCIUM 9.3 8.8 9.1   Liver Function Tests: No results found for this basename: AST, ALT, ALKPHOS, BILITOT, PROT, ALBUMIN,  in the last 168 hours No results found for this basename: LIPASE, AMYLASE,  in the last 168 hours No results found for this basename: AMMONIA,  in the last 168 hours CBC:  Recent Labs Lab 01/17/13 1745 01/18/13 0535  WBC 8.6 8.0  NEUTROABS 5.1  --   HGB 13.6 13.3  HCT 39.1 39.2  MCV 93.3 94.0  PLT 255 255   Cardiac Enzymes: No results found for this basename: CKTOTAL, CKMB, CKMBINDEX, TROPONINI,  in the last 168 hours BNP: No components found with this basename: POCBNP,  CBG:  Recent Labs Lab 01/18/13 0745 01/19/13 0749  GLUCAP 112* 124*    Recent Results (from the past 240 hour(s))  URINE CULTURE     Status: None   Collection Time    01/17/13  6:25 PM      Result Value Range Status   Specimen Description URINE, CLEAN CATCH   Final   Special Requests NONE   Final   Culture  Setup Time 01/18/2013 00:49   Final   Colony Count 4,000 COLONIES/ML  Final   Culture INSIGNIFICANT GROWTH   Final   Report Status 01/19/2013 FINAL   Final     Scheduled Meds: . aspirin EC  81 mg Oral Daily  . atenolol  25 mg Oral Daily  . docusate sodium  100 mg Oral BID  . heparin  5,000 Units Subcutaneous Q8H  .  HYDROmorphone (DILAUDID) injection  2 mg Intravenous Once  . loratadine  10 mg Oral Daily  . magic mouthwash  5 mL Oral Q4H  . methocarbamol  750 mg Oral QID  . methylPREDNISolone (SOLU-MEDROL) injection  40 mg Intravenous Q12H  . multivitamin with minerals  1 tablet Oral Daily  . pantoprazole  40 mg Oral Daily  . senna  2 tablet Oral Daily  . sertraline  100 mg Oral Daily   Continuous Infusions:    Shep Porter, DO  [facility] Pager [phone]  If 7PM-7AM, please contact  night-coverage [email or web address] Password [email or web address] 01/19/2013, 4:44 PM   LOS: 2 days

## 2013-01-20 DIAGNOSIS — F411 Generalized anxiety disorder: Secondary | ICD-10-CM | POA: Diagnosis not present

## 2013-01-20 DIAGNOSIS — R52 Pain, unspecified: Secondary | ICD-10-CM | POA: Diagnosis not present

## 2013-01-20 DIAGNOSIS — M549 Dorsalgia, unspecified: Secondary | ICD-10-CM | POA: Diagnosis not present

## 2013-01-20 DIAGNOSIS — M48061 Spinal stenosis, lumbar region without neurogenic claudication: Secondary | ICD-10-CM | POA: Diagnosis not present

## 2013-01-20 LAB — BASIC METABOLIC PANEL
Calcium: 8.8 mg/dL (ref 8.4–10.5)
GFR calc Af Amer: >90 mL/min (ref >90)
GFR calc non Af Amer: >90 mL/min (ref >90)
Glucose, Bld: 115 mg/dL — ABNORMAL HIGH (ref 70–99)
Potassium: 3.8 mEq/L (ref 3.5–5.1)
Sodium: 134 mEq/L — ABNORMAL LOW (ref 135–145)

## 2013-01-20 LAB — GLUCOSE, CAPILLARY: Glucose-Capillary: 105 mg/dL — ABNORMAL HIGH (ref 70–99)

## 2013-01-20 MED ORDER — ALPRAZOLAM 1 MG PO TABS
1.0000 mg | ORAL_TABLET | Freq: Two times a day (BID) | ORAL | Status: DC | PRN
  Administered 2013-01-20: 1 mg via ORAL
  Filled 2013-01-20: qty 1

## 2013-01-20 MED ORDER — PREDNISONE 50 MG PO TABS
50.0000 mg | ORAL_TABLET | Freq: Every day | ORAL | Status: DC
  Administered 2013-01-21: 50 mg via ORAL
  Filled 2013-01-20 (×2): qty 1

## 2013-01-20 MED ORDER — MORPHINE SULFATE ER 30 MG PO TBCR
45.0000 mg | EXTENDED_RELEASE_TABLET | Freq: Two times a day (BID) | ORAL | Status: DC
  Administered 2013-01-20: 45 mg via ORAL
  Filled 2013-01-20: qty 1

## 2013-01-20 NOTE — Progress Notes (Signed)
[facility] PROGRESS NOTE  Angelica [name] ZOX:[id number] DOB: [birth year] DOA: 01/17/2013 PCP: Nelwyn Salisbury, MD Brief history  72 year old female with a history of spinal stenosis and chronic back pain presents with one week history of progressively worsening lumbar back pain. Since Wednesday, 01/15/2013, the patient has had to use up to 8 Percocets per day. Normally, the patient uses 4-5 Percocets daily. Even with the increased number of Percocets, the patient states that the pain was uncontrolled. She denies any recent falls or injuries. She states that the pain shoots into her left leg down to her knee. She denies any falls or syncope. However, because of the pain she has had to use a walker for the past few days. She denies any bowel or bladder incontinence. There's been no fevers or chills. The patient never went to get any spinal injections as indicated previously. She stated that her insurance would not pay for the injections. Since admission, intravenous hydromorphone has provided the patient with good relief of her pain.  Assessment/Plan:  Intractable back pain/uncontrolled pain  -Requiring intravenous hydromorphone  -continue IV hydromorphone  -MRI lumbar spine-shows progression of L4-5 spinal stenosis with bilateral foraminal encroachment  -spoke with neurosurgery, Dr. Evalee Mutton surgical intervention necessary at this time  -followup with Dr. Venetia Maxon in office  -change Solu-Medrol IV to prednisone -Continue Robaxin  -add MS Contin-->increase dose to 45mg  q 12hrs -PT evaluation--patient essentially refused evaluation today. -I. discussed with patient importance of physical therapy. -IR not available to do epidural injections this week. Will need to schedule as an outpatient Spinal stenosis  -CT lumbar spine with no interval progression of disc space narrowing, no discitis; DJD most severe at L1-L2, L5-S1  -MRI lumbar spine as discussed  Abdominal pain  -Likely constipation-->  improved -CT abdomen pelvis revealed no acute abnormalities  Hypertension  -Continue atenolol  Depression  -Continue Zoloft  -Resume home Xanax dose for anxiety Constipation  -Colace and Senokot  Family Communication: Pt at beside  Disposition Plan: SNF vs Home with PT      Procedures/Studies: Ct Abdomen Pelvis Wo Contrast  01/17/2013  *RADIOLOGY REPORT*  Clinical Data: Increasing acute on chronic back pain  CT ABDOMEN AND PELVIS WITHOUT CONTRAST  Technique:  Multidetector CT imaging of the abdomen and pelvis was performed following the standard protocol without intravenous contrast.  Comparison: Concurrently obtained CT scan of the lumbar spine; prior MRI lumbar spine 03/20/2012; prior CT abdomen/pelvis 05/08/2008  Findings:  Lower Chest:  Mild atelectasis in the right lower lobe.  The lung bases are otherwise clear.  The visualized cardiac structures are within normal limits for size.  No pericardial effusion.  There is a small hiatal hernia.  Abdomen: Unenhanced CT was performed per clinician order.  Lack of IV contrast limits sensitivity and specificity, especially for evaluation of abdominal/pelvic solid viscera.  Within these limitations, unremarkable CT appearance of the stomach, duodenum, spleen, adrenal glands, pancreas and liver.  The gallbladder is surgically absent.  No significant biliary ductal dilatation.  Exophytic cystic lesion from the upper pole of the right kidney is incompletely characterized in the absence of intravenous contrast material.  However, it remains unchanged in size and configuration dating back to June 2009 and is therefore likely a benign cyst.  No nephrolithiasis or hydronephrosis.  Calcifications in the right renal hilum are likely vascular.  Normal-caliber large and small bowel throughout the abdomen.  No bowel obstruction.  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  No evidence  of active inflammation.   Normal appendix in the right lower quadrant.  The terminal ileum is unremarkable.  A 9 mm peripherally calcified structure in the pericolonic fat adjacent to the sigmoid colon in the left lower quadrant may represent the sequela of prior epiploic appendagitis.  No free fluid or suspicious adenopathy.  Pelvis: Normal bladder, uterus and adnexa.  No free fluid or adenopathy.  Bones: No acute fracture or aggressive appearing lytic or blastic osseous lesion.  There is dextroconvex scoliosis of the lumbar spine and multilevel degenerative changes.  Please see dedicated CT scan of the lumbar spine for more detail.  Vascular: Limited evaluation in the absence of intravenous contrast material.  Scattered atherosclerotic vascular calcifications without aneurysmal dilatation.  IMPRESSION:  1.  No acute intra-abdominal or pelvic abnormality to explain the patient's clinical symptoms of back pain. 2.  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  Atherosclerotic vascular disease   Original Report Authenticated By: Malachy Moan, M.D.    Dg Hip Complete Left  01/17/2013  *RADIOLOGY REPORT*  Clinical Data: Left hip pain times 2 days, no known injury  LEFT HIP - COMPLETE 2+ VIEW  Comparison: Prior radiographs of the left hip 03/28/2012  Findings: The bones appear mildly osteopenic.  No evidence of acute fracture, or malalignment.  There is mild degenerative arthritis of both hip joints without significant interval progression compared to prior. Visualized bowel gas pattern is unremarkable. Degenerative changes are noted in the lower lumbar spine.  IMPRESSION:  1.  No acute findings in the pelvis or left hip and no significant interval change compared to 03/28/2012 2.  Mild osteoarthritis of the bilateral hips 3.  Osteopenia   Original Report Authenticated By: Malachy Moan, M.D.    Ct Lumbar Spine Wo Contrast  01/17/2013  *RADIOLOGY REPORT*  Clinical Data: Increasing acute on chronic back pain  CT LUMBAR  SPINE WITHOUT CONTRAST  Technique:  Multidetector CT imaging of the lumbar spine was performed without intravenous contrast administration. Multiplanar CT image reconstructions were also generated.  Comparison: Concurrently obtained CT scan of the abdomen and pelvis; prior MRI of the lumbar spine 03/20/2012  Findings: Degenerative dextroconvex scoliosis centered at L2-L3 there is no evidence of acute fracture, malalignment, or aggressive lytic or blastic osseous lesion.  Multilevel degenerative disc disease is noted.  No destructive change, or significant interval progression of disc space narrowing compared to the prior study to suggest an underlying diskitis.  Degenerative changes are most significant at L1-L2 and L5-S1.  Prior left laminectomy at L5-S1. There is multilevel facet arthropathy more prominent on the right than the left.  Levels of greatest severity are L4-L5 and L5-S1.  IMPRESSION:  1.  No acute fracture, malalignment or aggressive lytic or blastic osseous lesion.  2.  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   Original Report Authenticated By: Malachy Moan, M.D.    Mr Lumbar Spine W Wo Contrast  01/18/2013  *RADIOLOGY REPORT*  Clinical Data: Severe back pain with left groin and leg pain. Spinal stenosis.  MRI LUMBAR SPINE WITHOUT AND WITH CONTRAST  Technique:  Multiplanar and multiecho pulse sequences of the lumbar spine were obtained without and with intravenous contrast.  Contrast: 10mL MULTIHANCE GADOBENATE DIMEGLUMINE 529 MG/ML IV SOLN  Comparison:  CT lumbar 01/17/2013, MRI 03/20/2012.  Findings: Dextroscoliosis.  Negative for fracture or mass lesion. Hemangioma S1 vertebral  body is unchanged.  Conus medullaris is normal and terminates at L1-2.  L1-2:  Disc degeneration and spondylosis.  Mild  facet hypertrophy without significant spinal stenosis.  No change from prior MRI.  L2-3:  Disc degeneration and spondylosis on the left with left foraminal narrowing.  Bilateral facet hypertrophy.  Mild narrowing of the spinal canal.  L3-4:  Disc degeneration and spondylosis.  Bilateral facet hypertrophy.  Mild foraminal narrowing bilaterally.  Mild central canal stenosis.  L4-5:  Disc degeneration and spondylosis with bilateral facet hypertrophy.  Moderate to severe spinal stenosis has progressed since 03/20/2012.  There is foraminal encroachment bilaterally.  L5-S1:  Prior laminectomy on the left.  Disc degeneration and spondylosis.  Right foraminal encroachment due to spurring and facet hypertrophy.  IMPRESSION: Mild spinal stenosis L2-3 and L3-4.  Progression of spinal stenosis at L4-5 which is now moderate to severe.  Postop laminectomy left L5-S1.  There is right foraminal encroachment due to spurring.   Original Report Authenticated By: Janeece Riggers, M.D.          Subjective:  patient states the pain is 25% better with adding MS Contin. Denies fevers, chills, chest pain, shortness breath, nausea, vomiting, diarrhea. No dysuria or hematuria. No rashes. No dizziness .  Objective: Filed Vitals:   01/19/13 1545 01/19/13 2200 01/20/13 0600 01/20/13 1339  BP: 90/52 109/68 129/69 117/69  Pulse: 66 67 72 63  Temp: 98.4 F (36.9 C) 98.1 F (36.7 C) 98.2 F (36.8 C) 98.1 F (36.7 C)  TempSrc: Oral Oral Oral Oral  Resp: 18 20 18 18   Height:      Weight:      SpO2: 92% 93% 90% 93%   No intake or output data in the 24 hours ending 01/20/13 1941 Weight change:  Exam:   General:  Pt is alert, follows commands appropriately, not in acute distress  HEENT: No icterus, No thrush, [place]/AT  Cardiovascular: RRR, S1/S2, no rubs, no gallops  Respiratory: CTA bilaterally, no wheezing, no crackles, no rhonchi  Abdomen: Soft/+BS, non tender, non distended, no guarding  Extremities: No edema, No  lymphangitis, No petechiae, No rashes, no synovitis--positive straight leg test on the left. Epicritic and light touch sensations intact bilateral legs.   Data Reviewed: Basic Metabolic Panel:  Recent Labs Lab 01/17/13 1745 01/18/13 0535 01/19/13 0510 01/20/13 0447  NA 137 137 134* 134*  K 3.9 4.2 4.1 3.8  CL 101 102 99 97  CO2 24 23 26 25   GLUCOSE 99 123* 128* 115*  BUN 12 14 16 14   CREATININE 0.48* 0.44* 0.47* 0.45*  CALCIUM 9.3 8.8 9.1 8.8   Liver Function Tests: No results found for this basename: AST, ALT, ALKPHOS, BILITOT, PROT, ALBUMIN,  in the last 168 hours No results found for this basename: LIPASE, AMYLASE,  in the last 168 hours No results found for this basename: AMMONIA,  in the last 168 hours CBC:  Recent Labs Lab 01/17/13 1745 01/18/13 0535  WBC 8.6 8.0  NEUTROABS 5.1  --   HGB 13.6 13.3  HCT 39.1 39.2  MCV 93.3 94.0  PLT 255 255   Cardiac Enzymes: No results found for this basename: CKTOTAL, CKMB, CKMBINDEX, TROPONINI,  in the last 168 hours BNP: No components found with this basename: POCBNP,  CBG:  Recent Labs Lab 01/18/13 0745 01/19/13 0749 01/20/13 0722  GLUCAP 112* 124* 105*    Recent Results (from the past 240 hour(s))  URINE CULTURE     Status: None  Collection Time    01/17/13  6:25 PM      Result Value Range Status   Specimen Description URINE, CLEAN CATCH   Final   Special Requests NONE   Final   Culture  Setup Time 01/18/2013 00:49   Final   Colony Count 4,000 COLONIES/ML   Final   Culture INSIGNIFICANT GROWTH   Final   Report Status 01/19/2013 FINAL   Final     Scheduled Meds: . aspirin EC  81 mg Oral Daily  . atenolol  25 mg Oral Daily  . docusate sodium  100 mg Oral BID  . heparin  5,000 Units Subcutaneous Q8H  .  HYDROmorphone (DILAUDID) injection  2 mg Intravenous Once  . loratadine  10 mg Oral Daily  . magic mouthwash  5 mL Oral Q4H  . methocarbamol  750 mg Oral QID  . methylPREDNISolone (SOLU-MEDROL)  injection  40 mg Intravenous Q12H  . morphine  45 mg Oral Q12H  . multivitamin with minerals  1 tablet Oral Daily  . pantoprazole  40 mg Oral Daily  . [START ON 01/21/2013] predniSONE  50 mg Oral Q breakfast  . senna  2 tablet Oral Daily  . sertraline  100 mg Oral Daily   Continuous Infusions:    TAT, DAVID, DO  [facility] Pager [phone]  If 7PM-7AM, please contact night-coverage [email or web address] Password [email or web address] 01/20/2013, 7:41 PM   LOS: 3 days

## 2013-01-20 NOTE — Progress Notes (Signed)
PT NOTE  Order received. Chart reviewed. 2 attempts to eval pt on today. On 1st attempt, pt declined due to pain. On 2nd attempt, pt declined because she wanted to have nurse tech give her a bath. Explained to pt that PT may have to check back on tomorrow-pt okay with this. Will check back tomorrow. Thanks.   Rebeca Alert, MPT  Pager: [phone]

## 2013-01-21 DIAGNOSIS — M1990 Unspecified osteoarthritis, unspecified site: Secondary | ICD-10-CM

## 2013-01-21 DIAGNOSIS — M549 Dorsalgia, unspecified: Secondary | ICD-10-CM | POA: Diagnosis not present

## 2013-01-21 DIAGNOSIS — R52 Pain, unspecified: Secondary | ICD-10-CM | POA: Diagnosis not present

## 2013-01-21 DIAGNOSIS — M48061 Spinal stenosis, lumbar region without neurogenic claudication: Secondary | ICD-10-CM | POA: Diagnosis not present

## 2013-01-21 LAB — BASIC METABOLIC PANEL
Calcium: 9.2 mg/dL (ref 8.4–10.5)
Creatinine, Ser: 0.45 mg/dL — ABNORMAL LOW (ref 0.50–1.10)
GFR calc non Af Amer: >90 mL/min (ref >90)
Sodium: 137 mEq/L (ref 135–145)

## 2013-01-21 LAB — GLUCOSE, CAPILLARY

## 2013-01-21 MED ORDER — OXYCODONE HCL 5 MG PO TABS
10.0000 mg | ORAL_TABLET | ORAL | Status: DC | PRN
  Administered 2013-01-21 – 2013-01-22 (×6): 10 mg via ORAL
  Filled 2013-01-21 (×6): qty 2

## 2013-01-21 MED ORDER — ALPRAZOLAM 1 MG PO TABS
1.0000 mg | ORAL_TABLET | Freq: Two times a day (BID) | ORAL | Status: DC
  Administered 2013-01-21 – 2013-01-22 (×3): 1 mg via ORAL
  Filled 2013-01-21 (×4): qty 1

## 2013-01-21 MED ORDER — PREDNISONE 20 MG PO TABS
40.0000 mg | ORAL_TABLET | Freq: Every day | ORAL | Status: DC
  Administered 2013-01-22: 40 mg via ORAL
  Filled 2013-01-21 (×3): qty 2

## 2013-01-21 MED ORDER — KETOROLAC TROMETHAMINE 30 MG/ML IJ SOLN
30.0000 mg | Freq: Once | INTRAMUSCULAR | Status: AC
  Administered 2013-01-21: 30 mg via INTRAVENOUS
  Filled 2013-01-21: qty 1

## 2013-01-21 MED ORDER — MORPHINE SULFATE ER 30 MG PO TBCR
60.0000 mg | EXTENDED_RELEASE_TABLET | Freq: Two times a day (BID) | ORAL | Status: DC
  Administered 2013-01-21 (×2): 60 mg via ORAL
  Filled 2013-01-21 (×2): qty 2

## 2013-01-21 NOTE — Accreditation Note (Signed)
Patient continues to sit up in chair.  States it feels better being up.  Grades pain at 2/10 at this time

## 2013-01-21 NOTE — Clinical Social Work Psychosocial (Unsigned)
      Clinical Social Work Department BRIEF PSYCHOSOCIAL ASSESSMENT 01/21/2013  Patient:  Angelica [name], Angelica [name]     Account Number:  [id number]     Admit date:  01/17/2013  Clinical Social Worker:  Hattie Perch  Date/Time:  01/21/2013 12:00 M  Referred by:  Physician  Date Referred:  01/21/2013 Referred for  SNF Placement   Other Referral:   Interview type:  Patient Other interview type:    PSYCHOSOCIAL DATA Living Status:  ALONE Admitted from facility:   Level of care:   Primary support name:  Angelica [name] Primary support relationship to patient:  CHILD, ADULT Degree of support available:   good    CURRENT CONCERNS Current Concerns  Post-Acute Placement   Other Concerns:    SOCIAL WORK ASSESSMENT / PLAN CSW met with patient. Patient is alert and Oriented X3. patient son at bedside. discussed need for snf placement. Agreeable to snf at [facility].   Assessment/plan status:   Other assessment/ plan:   Information/referral to community resources:    PATIENTS/FAMILYS RESPONSE TO PLAN OF CARE: agreeable to snf at [facility].

## 2013-01-21 NOTE — Progress Notes (Addendum)
Discussion held with patient regarding pending discharge, home needs, participation with PT to evaluate her ambulatory status.  She does not really want to be discharged to a nursing facility, if needs facility will go for rehab, has someone at home to help her and if she needs home care from an agency she would like [place].

## 2013-01-21 NOTE — Evaluation (Signed)
Physical Therapy Evaluation Patient Details Name: Angelica [name] MRN: [id number] DOB: July 18, 1941 Today's Date: 01/21/2013 Time: 3086-5784 PT Time Calculation (min): 39 min  PT Assessment / Plan / Recommendation Clinical Impression  Pt. is 72 yo female admitted 01/17/13 with intractable back and leg pain. Pt. has a h/o of back surgery, Pt. appears to be uncomfortable while in bed. Pt did stay in recliner, placed back support roll and pillows. Pt. stated that she felt very good and pain had lessened while reclined in chair. Pt. will benefit from PT while in acute care. Recommend SNF for improving pt's mobility as currently she is very limited and feel having daily therapy will encourage pt. to improve.    PT Assessment  Patient needs continued PT services    Follow Up Recommendations  SNF    Does the patient have the potential to tolerate intense rehabilitation      Barriers to Discharge Decreased caregiver support      Equipment Recommendations   (3in1)    Recommendations for Other Services     Frequency Min 3X/week    Precautions / Restrictions Precautions Precautions: Back   Pertinent Vitals/Pain Initially 10 Lside and leg. Decreased to 1 when not moving.      Mobility  Bed Mobility Bed Mobility: Rolling Right;Right Sidelying to Sit Rolling Right: 6: Modified independent (Device/Increase time) Right Sidelying to Sit: 4: Min assist;With rails Details for Bed Mobility Assistance: Pt  is noted to be writhing in bed, constantly moving Legs and rolling from side to side. stating she cannot find a comfortable position. Pt.was able to roll to her side and move her legs over edge and push herself up with rail use. Transfers Transfers: Sit to Stand;Stand to Sit Sit to Stand: 4: Min assist;With upper extremity assist;From bed;From chair/3-in-1;With armrests Stand to Sit: To chair/3-in-1;With armrests Details for Transfer Assistance: pt. sat to R side,  cues to push from [facility].  aaAfter sitting in recliner, placed  Ambulation/Gait Ambulation/Gait Assistance: 4: Min assist;3: Mod assist Ambulation Distance (Feet): 20 Feet Assistive device: Rolling walker Ambulation/Gait Assistance Details: Pt. noted to hacve decreased weight on LLE, at times pt hopped, then would toe touch, at times would lean over front of RW stating the pain down her leg was so bad that she had to stop, Gait Pattern: Step-to pattern;Decreased stance time - left;Decreased weight shift to left;Antalgic;Trunk flexed;Trunk rotated posteriorly on left Gait velocity: decr    Exercises     PT Diagnosis: Abnormality of gait;Acute pain;Generalized weakness  PT Problem List: Decreased strength;Decreased activity tolerance;Decreased mobility;Decreased safety awareness;Decreased knowledge of use of DME;Pain;Decreased knowledge of precautions PT Treatment Interventions: DME instruction;Gait training;Functional mobility training;Therapeutic activities;Therapeutic exercise;Stair training;Patient/family education   PT Goals Acute Rehab PT Goals PT Goal Formulation: With patient Time For Goal Achievement: 02/04/13 Potential to Achieve Goals: Good Pt will go Supine/Side to Sit: Independently PT Goal: Supine/Side to Sit - Progress: Goal set today Pt will go Sit to Supine/Side: Independently PT Goal: Sit to Supine/Side - Progress: Goal set today Pt will go Sit to Stand: with modified independence PT Goal: Sit to Stand - Progress: Goal set today Pt will go Stand to Sit: with modified independence PT Goal: Stand to Sit - Progress: Goal set today Pt will Ambulate: 51 - 150 feet;with supervision;with rolling walker PT Goal: Ambulate - Progress: Goal set today Pt will Perform Home Exercise Program: with supervision, verbal cues required/provided PT Goal: Perform Home Exercise Program - Progress: Goal set today  Visit Information  Last PT Received On: 01/21/13 Assistance Needed: +1    Subjective Data   Subjective: I will do whatever it takes.I cannot sit on the toilet. I cannot get comfortable. It is shooting down my L leg. Patient Stated Goal: to be able to take care of myself.   Prior Functioning  Home Living Lives With: Alone Available Help at Discharge: Family;Available PRN/intermittently Type of Home: House Home Access: Stairs to enter [employer] of Steps: 2 Entrance Stairs-Rails: Left Home Layout: One level Bathroom Shower/Tub: Engineer, manufacturing systems: Standard Home Adaptive Equipment: Walker - rolling;Straight cane Prior Function Level of Independence: Independent with assistive device(s) Able to Take Stairs?: Yes Comments: recently has not been able to perform all ADL's. Communication Communication: No difficulties    Cognition  Cognition Overall Cognitive Status: Appears within functional limits for tasks assessed/performed Arousal/Alertness: Awake/alert Orientation Level: Appears intact for tasks assessed Behavior During Session: Anxious Cognition - Other Comments: Pt. appears in distress.    Extremity/Trunk Assessment Right Upper Extremity Assessment RUE ROM/Strength/Tone: [facility] for tasks assessed Left Upper Extremity Assessment LUE ROM/Strength/Tone: WFL for tasks assessed Right Lower Extremity Assessment RLE ROM/Strength/Tone: WFL for tasks assessed Left Lower Extremity Assessment LLE ROM/Strength/Tone: WFL for tasks assessed   Balance Balance Balance Assessed: Yes Static Sitting Balance Static Sitting - Balance Support: Feet supported;Bilateral upper extremity supported Static Sitting - Level of Assistance: 5: Stand by assistance Static Sitting - Comment/# of Minutes: pt does support self w/UE's, unable to sit freely without UE's.  End of Session PT - End of Session Activity Tolerance: Patient limited by pain Patient left: in chair;with call bell/phone within reach Nurse Communication: Mobility status  GP     Rada Hay 01/21/2013, 2:56 PM  [phone]

## 2013-01-21 NOTE — Progress Notes (Signed)
Patient ambulated with PT, sitting in chair currently, appears comfortable and grades pain as a 3/10.  Spoke with Becky Sax, CSW regarding need for facility placement for rehab

## 2013-01-21 NOTE — Progress Notes (Addendum)
[facility] PROGRESS NOTE  Angelica [name] XLK:[id number] DOB: [birth year] DOA: 01/17/2013 PCP: Nelwyn Salisbury, MD  Assessment/Plan: Brief history  72 year old female with a history of spinal stenosis and chronic back pain presents with one week history of progressively worsening lumbar back pain. Since Wednesday, 01/15/2013, the patient has had to use up to 8 Percocets per day. Normally, the patient uses 4-5 Percocets daily. Even with the increased number of Percocets, the patient states that the pain was uncontrolled. She denies any recent falls or injuries. She states that the pain shoots into her left leg down to her knee. She denies any falls or syncope. However, because of the pain she has had to use a walker for the past few days. She denies any bowel or bladder incontinence. There's been no fevers or chills. The patient never went to get any spinal injections as indicated previously. She stated that her insurance would not pay for the injections. Since admission, intravenous hydromorphone has provided the patient with good relief of her pain. The patient's IV hydromorphone was transitioned to oral oxycodone for breakthrough pain as the patient's MS Contin was increased to 60 mg every 12 hours. The patient's pain became better controlled as her opioids were titrated. Initially, the patient was very persistent in refusing physical therapy which prolonged her hospital stay. After long discussions, the patient finally agreed to participate in physical therapy and agreed to go to nursing facility. The patient's pain was compounded for her anxiety. This improved with reinstitution of her Xanax. MRI lumbar spine was obtained. Although he was initially interpreted as worsening of her L4-5 stenosis, consult with neurosurgery revealed no significant worsening. They recommended followup as an outpatient. Assessment/Plan:  Intractable back pain/uncontrolled pain  -Requiring intravenous hydromorphone   -Weaned off of IV hydromorphone 01/21/2013 -MS Contin increased to 60 mg twice a day 01/21/2013 with OxyContin 10 mg every 4 hours prn breakthrough pain -MRI lumbar spine-shows progression of L4-5 spinal stenosis with bilateral foraminal encroachment  -spoke with neurosurgery, Dr. Evalee Mutton surgical intervention necessary at this time  -followup with Dr. Venetia Maxon in office  -change Solu-Medrol IV to prednisone  -wean by 10 mg daily -Continue Robaxin  -PT evaluation--patient essentially refused evaluation 3/9 and 3/10.  -I. discussed with patient importance of physical therapy.  -IR not available to do epidural injections this week. Will need to schedule as an outpatient  -SNF ([facility]) on 01/22/13 Spinal stenosis  -CT lumbar spine with no interval progression of disc space narrowing, no discitis; DJD most severe at L1-L2, L5-S1  -MRI lumbar spine as discussed  Abdominal pain  -Likely constipation--> improved  -CT abdomen pelvis revealed no acute abnormalities  Hypertension  -Continue atenolol  Depression  -Continue Zoloft  -Resume home Xanax dose for anxiety  Constipation  -Colace and Senokot  Family Communication: Pt at beside  Disposition Plan: SNF 01/22/13--[facility]         Procedures/Studies: Ct Abdomen Pelvis Wo Contrast  01/17/2013  *RADIOLOGY REPORT*  Clinical Data: Increasing acute on chronic back pain  CT ABDOMEN AND PELVIS WITHOUT CONTRAST  Technique:  Multidetector CT imaging of the abdomen and pelvis was performed following the standard protocol without intravenous contrast.  Comparison: Concurrently obtained CT scan of the lumbar spine; prior MRI lumbar spine 03/20/2012; prior CT abdomen/pelvis 05/08/2008  Findings:  Lower Chest:  Mild atelectasis in the right lower lobe.  The lung bases are otherwise clear.  The visualized cardiac structures are within normal limits for size.  No pericardial effusion.  There is a small hiatal hernia.  Abdomen: Unenhanced CT was  performed per clinician order.  Lack of IV contrast limits sensitivity and specificity, especially for evaluation of abdominal/pelvic solid viscera.  Within these limitations, unremarkable CT appearance of the stomach, duodenum, spleen, adrenal glands, pancreas and liver.  The gallbladder is surgically absent.  No significant biliary ductal dilatation.  Exophytic cystic lesion from the upper pole of the right kidney is incompletely characterized in the absence of intravenous contrast material.  However, it remains unchanged in size and configuration dating back to June 2009 and is therefore likely a benign cyst.  No nephrolithiasis or hydronephrosis.  Calcifications in the right renal hilum are likely vascular.  Normal-caliber large and small bowel throughout the abdomen.  No bowel obstruction.  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  No evidence of active inflammation.  Normal appendix in the right lower quadrant.  The terminal ileum is unremarkable.  A 9 mm peripherally calcified structure in the pericolonic fat adjacent to the sigmoid colon in the left lower quadrant may represent the sequela of prior epiploic appendagitis.  No free fluid or suspicious adenopathy.  Pelvis: Normal bladder, uterus and adnexa.  No free fluid or adenopathy.  Bones: No acute fracture or aggressive appearing lytic or blastic osseous lesion.  There is dextroconvex scoliosis of the lumbar spine and multilevel degenerative changes.  Please see dedicated CT scan of the lumbar spine for more detail.  Vascular: Limited evaluation in the absence of intravenous contrast material.  Scattered atherosclerotic vascular calcifications without aneurysmal dilatation.  IMPRESSION:  1.  No acute intra-abdominal or pelvic abnormality to explain the patient's clinical symptoms of back pain. 2.  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  Atherosclerotic vascular disease   Original  Report Authenticated By: Malachy Moan, M.D.    Dg Hip Complete Left  01/17/2013  *RADIOLOGY REPORT*  Clinical Data: Left hip pain times 2 days, no known injury  LEFT HIP - COMPLETE 2+ VIEW  Comparison: Prior radiographs of the left hip 03/28/2012  Findings: The bones appear mildly osteopenic.  No evidence of acute fracture, or malalignment.  There is mild degenerative arthritis of both hip joints without significant interval progression compared to prior. Visualized bowel gas pattern is unremarkable. Degenerative changes are noted in the lower lumbar spine.  IMPRESSION:  1.  No acute findings in the pelvis or left hip and no significant interval change compared to 03/28/2012 2.  Mild osteoarthritis of the bilateral hips 3.  Osteopenia   Original Report Authenticated By: Malachy Moan, M.D.    Ct Lumbar Spine Wo Contrast  01/17/2013  *RADIOLOGY REPORT*  Clinical Data: Increasing acute on chronic back pain  CT LUMBAR SPINE WITHOUT CONTRAST  Technique:  Multidetector CT imaging of the lumbar spine was performed without intravenous contrast administration. Multiplanar CT image reconstructions were also generated.  Comparison: Concurrently obtained CT scan of the abdomen and pelvis; prior MRI of the lumbar spine 03/20/2012  Findings: Degenerative dextroconvex scoliosis centered at L2-L3 there is no evidence of acute fracture, malalignment, or aggressive lytic or blastic osseous lesion.  Multilevel degenerative disc disease is noted.  No destructive change, or significant interval progression of disc space narrowing compared to the prior study to suggest an underlying diskitis.  Degenerative changes are most significant at L1-L2 and L5-S1.  Prior left laminectomy at L5-S1. There is multilevel facet arthropathy more prominent on the right than the left.  Levels of greatest severity are L4-L5  and L5-S1.  IMPRESSION:  1.  No acute fracture, malalignment or aggressive lytic or blastic osseous lesion.  2.   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   Original Report Authenticated By: Malachy Moan, M.D.    Mr Lumbar Spine W Wo Contrast  01/18/2013  *RADIOLOGY REPORT*  Clinical Data: Severe back pain with left groin and leg pain. Spinal stenosis.  MRI LUMBAR SPINE WITHOUT AND WITH CONTRAST  Technique:  Multiplanar and multiecho pulse sequences of the lumbar spine were obtained without and with intravenous contrast.  Contrast: 10mL MULTIHANCE GADOBENATE DIMEGLUMINE 529 MG/ML IV SOLN  Comparison:  CT lumbar 01/17/2013, MRI 03/20/2012.  Findings: Dextroscoliosis.  Negative for fracture or mass lesion. Hemangioma S1 vertebral body is unchanged.  Conus medullaris is normal and terminates at L1-2.  L1-2:  Disc degeneration and spondylosis.  Mild facet hypertrophy without significant spinal stenosis.  No change from prior MRI.  L2-3:  Disc degeneration and spondylosis on the left with left foraminal narrowing.  Bilateral facet hypertrophy.  Mild narrowing of the spinal canal.  L3-4:  Disc degeneration and spondylosis.  Bilateral facet hypertrophy.  Mild foraminal narrowing bilaterally.  Mild central canal stenosis.  L4-5:  Disc degeneration and spondylosis with bilateral facet hypertrophy.  Moderate to severe spinal stenosis has progressed since 03/20/2012.  There is foraminal encroachment bilaterally.  L5-S1:  Prior laminectomy on the left.  Disc degeneration and spondylosis.  Right foraminal encroachment due to spurring and facet hypertrophy.  IMPRESSION: Mild spinal stenosis L2-3 and L3-4.  Progression of spinal stenosis at L4-5 which is now moderate to severe.  Postop laminectomy left L5-S1.  There is right foraminal encroachment due to spurring.   Original Report Authenticated By: Janeece Riggers, M.D.           Subjective: Patient states that pain is under better control today. Denies fevers, chills, chest pain, shortness breath, nausea, vomiting, diarrhea, abdominal pain, dizziness, rashes.  Objective: Filed Vitals:   01/20/13 1339 01/20/13 2056 01/21/13 0555 01/21/13 1407  BP: 117/69 109/65 135/85 128/72  Pulse: 63 70 77 76  Temp: 98.1 F (36.7 C) 98.6 F (37 C) 98.1 F (36.7 C) 97.5 F (36.4 C)  TempSrc: Oral Oral Oral Oral  Resp: 18 18 16 18   Height:      Weight:      SpO2: 93% 90% 95% 94%    Intake/Output Summary (Last 24 hours) at 01/21/13 1910 Last data filed at 01/21/13 1842  Gross per 24 hour  Intake    240 ml  Output    250 ml  Net    -10 ml   Weight change:  Exam:   General:  Pt is alert, follows commands appropriately, not in acute distress  HEENT: No icterus, No thrush, [place]/AT  Cardiovascular: RRR, S1/S2, no rubs, no gallops  Respiratory: CTA bilaterally, no wheezing, no crackles, no rhonchi  Abdomen: Soft/+BS, non tender, non distended, no guarding  Extremities: No edema, No lymphangitis, No petechiae, No rashes, no synovitis  Data Reviewed: Basic Metabolic Panel:  Recent Labs Lab 01/17/13 1745 01/18/13 0535 01/19/13 0510 01/20/13 0447 01/21/13 0450  NA 137 137 134* 134* 137  K 3.9 4.2 4.1 3.8 3.8  CL 101 102 99 97 99  CO2 24 23 26 25 28   GLUCOSE 99 123* 128* 115* 118*  BUN 12 14 16 14  16  CREATININE 0.48* 0.44* 0.47* 0.45* 0.45*  CALCIUM 9.3 8.8 9.1 8.8 9.2   Liver Function Tests: No results found for this basename: AST, ALT, ALKPHOS, BILITOT, PROT, ALBUMIN,  in the last 168 hours No results found for this basename: LIPASE, AMYLASE,  in the last 168 hours No results found for this basename: AMMONIA,  in the last 168 hours CBC:  Recent Labs Lab 01/17/13 1745 01/18/13 0535  WBC 8.6 8.0  NEUTROABS 5.1  --   HGB 13.6 13.3  HCT 39.1 39.2  MCV 93.3 94.0  PLT 255 255   Cardiac Enzymes: No results found for this basename:  CKTOTAL, CKMB, CKMBINDEX, TROPONINI,  in the last 168 hours BNP: No components found with this basename: POCBNP,  CBG:  Recent Labs Lab 01/18/13 0745 01/19/13 0749 01/20/13 0722 01/21/13 0725  GLUCAP 112* 124* 105* 109*    Recent Results (from the past 240 hour(s))  URINE CULTURE     Status: None   Collection Time    01/17/13  6:25 PM      Result Value Range Status   Specimen Description URINE, CLEAN CATCH   Final   Special Requests NONE   Final   Culture  Setup Time 01/18/2013 00:49   Final   Colony Count 4,000 COLONIES/ML   Final   Culture INSIGNIFICANT GROWTH   Final   Report Status 01/19/2013 FINAL   Final     Scheduled Meds: . ALPRAZolam  1 mg Oral BID  . aspirin EC  81 mg Oral Daily  . atenolol  25 mg Oral Daily  . docusate sodium  100 mg Oral BID  . heparin  5,000 Units Subcutaneous Q8H  .  HYDROmorphone (DILAUDID) injection  2 mg Intravenous Once  . loratadine  10 mg Oral Daily  . magic mouthwash  5 mL Oral Q4H  . methocarbamol  750 mg Oral QID  . morphine  60 mg Oral Q12H  . multivitamin with minerals  1 tablet Oral Daily  . pantoprazole  40 mg Oral Daily  . predniSONE  50 mg Oral Q breakfast  . senna  2 tablet Oral Daily  . sertraline  100 mg Oral Daily   Continuous Infusions:    Declyn Delsol, DO  [facility] Pager [phone]  If 7PM-7AM, please contact night-coverage [email or web address] Password [email or web address] 01/21/2013, 7:10 PM   LOS: 4 days

## 2013-01-22 DIAGNOSIS — M545 Low back pain: Secondary | ICD-10-CM | POA: Diagnosis not present

## 2013-01-22 DIAGNOSIS — K5900 Constipation, unspecified: Secondary | ICD-10-CM | POA: Diagnosis not present

## 2013-01-22 DIAGNOSIS — M1990 Unspecified osteoarthritis, unspecified site: Secondary | ICD-10-CM | POA: Diagnosis not present

## 2013-01-22 DIAGNOSIS — I10 Essential (primary) hypertension: Secondary | ICD-10-CM | POA: Diagnosis not present

## 2013-01-22 DIAGNOSIS — R002 Palpitations: Secondary | ICD-10-CM | POA: Diagnosis not present

## 2013-01-22 DIAGNOSIS — R1084 Generalized abdominal pain: Secondary | ICD-10-CM | POA: Diagnosis not present

## 2013-01-22 DIAGNOSIS — F329 Major depressive disorder, single episode, unspecified: Secondary | ICD-10-CM | POA: Diagnosis not present

## 2013-01-22 DIAGNOSIS — Z5189 Encounter for other specified aftercare: Secondary | ICD-10-CM | POA: Diagnosis not present

## 2013-01-22 DIAGNOSIS — M549 Dorsalgia, unspecified: Secondary | ICD-10-CM | POA: Diagnosis not present

## 2013-01-22 DIAGNOSIS — J449 Chronic obstructive pulmonary disease, unspecified: Secondary | ICD-10-CM | POA: Diagnosis not present

## 2013-01-22 DIAGNOSIS — M48061 Spinal stenosis, lumbar region without neurogenic claudication: Secondary | ICD-10-CM | POA: Diagnosis not present

## 2013-01-22 MED ORDER — MORPHINE SULFATE ER 30 MG PO TBCR: 75.0000 mg | EXTENDED_RELEASE_TABLET | Freq: Two times a day (BID) | ORAL | Status: DC

## 2013-01-22 MED ORDER — GABAPENTIN 100 MG PO CAPS
100.0000 mg | ORAL_CAPSULE | Freq: Three times a day (TID) | ORAL | Status: DC
  Administered 2013-01-22: 100 mg via ORAL
  Filled 2013-01-22 (×3): qty 1

## 2013-01-22 MED ORDER — MORPHINE SULFATE ER 60 MG PO TBCR: 60.0000 mg | EXTENDED_RELEASE_TABLET | Freq: Two times a day (BID) | ORAL | Status: DC

## 2013-01-22 MED ORDER — DSS 100 MG PO CAPS: 100.0000 mg | ORAL_CAPSULE | Freq: Two times a day (BID) | ORAL | Status: DC

## 2013-01-22 MED ORDER — MORPHINE SULFATE ER 30 MG PO TBCR
60.0000 mg | EXTENDED_RELEASE_TABLET | Freq: Two times a day (BID) | ORAL | Status: DC
  Administered 2013-01-22: 60 mg via ORAL
  Filled 2013-01-22: qty 2

## 2013-01-22 MED ORDER — OXYCODONE HCL 10 MG PO TABS: 10.0000 mg | ORAL_TABLET | ORAL | Status: DC | PRN

## 2013-01-22 MED ORDER — HYDROMORPHONE HCL PF 2 MG/ML IJ SOLN
2.0000 mg | Freq: Once | INTRAMUSCULAR | Status: AC
  Administered 2013-01-22: 2 mg via INTRAVENOUS

## 2013-01-22 MED ORDER — SENNA 8.6 MG PO TABS: 2.0000 | ORAL_TABLET | Freq: Every day | ORAL | Status: DC

## 2013-01-22 MED ORDER — PANTOPRAZOLE SODIUM 40 MG PO TBEC: 40.0000 mg | DELAYED_RELEASE_TABLET | Freq: Every day | ORAL | Status: DC

## 2013-01-22 MED ORDER — ALPRAZOLAM 1 MG PO TABS: 1.0000 mg | ORAL_TABLET | Freq: Two times a day (BID) | ORAL | Status: DC | PRN

## 2013-01-22 MED ORDER — PREDNISONE 10 MG PO TABS: ORAL_TABLET | ORAL | Status: DC

## 2013-01-22 MED ORDER — GABAPENTIN 100 MG PO CAPS: 100.0000 mg | ORAL_CAPSULE | Freq: Three times a day (TID) | ORAL | Status: DC

## 2013-01-22 NOTE — Progress Notes (Signed)
Patient assisted by Aram Beecham NT with bath and dressing for discharge.  Patient is now sleeping, snoring, awaiting transport to [facility] facility

## 2013-01-22 NOTE — Discharge Summary (Signed)
Physician Discharge Summary  Angelica [name] ZOX:[id number] DOB: 1941/10/07 DOA: 01/17/2013  PCP: Nelwyn Salisbury, MD  Admit date: 01/17/2013 Discharge date: 01/22/2013  Recommendations for Outpatient Follow-up:  1. Follow up with the pain clinic as soon as possible. PCP to refer. 2. IR for injection next week when schedule permits.   3. Neurosurgery outpatient follow up as needed.   4. Increase gabapentin to 200mg  TID on 3/13, then 300mg  TID on 3/14 as tolerated.  PCP may adjust thereafter 5. Prednisone 40mg  on 3/12, 30mg  on 3/13, 20mg  on 3/14, 10mg  on 3/15, then stop.   6. Consider increasing zoloft as outpatient  Discharge Diagnoses:  Active Problems:   HYPERTENSION   Back pain, subacute   Spinal stenosis   Uncontrolled pain   Spinal stenosis of lumbar region   Discharge Condition: stable, improved  Diet recommendation: healthy heart  Wt Readings from Last 3 Encounters:  01/18/13 53.524 kg (118 lb)  12/20/12 53.524 kg (118 lb)  11/18/12 54.432 kg (120 lb)    History of present illness:   Angelica [name] is a 72 y.o. female who presents with c/o intractable back and leg pain involving her LLE. Her pain is worse when she tries to sit down. 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 Pain began to get worse on wed and has persisted since that time. Walking the leg also makes it worse. Took home percocet with some but incomplete relief.   In the ED back pain was controlled with IV dilaudid and she was given a dose of solumedrol, hospitalist has been asked to admit for pain control.  Hospital Course:   Intractable back pain/uncontrolled pain, initially required IV dilaudid which was weaned off on 3/11.  She was transitioned to MS Contin 60 mg twice a day 01/21/2013 with oxycodone 10 mg every 4 hours prn breakthrough pain.  She continued to have severe pain and was started on gabapentin.  MRI lumbar spine-shows progression of L4-5  spinal stenosis with bilateral foraminal encroachment.  Neurosurgery, Dr. Newell Coral recommended no surgical intervention necessary at this time and followup with Dr. Venetia Maxon in office in a few weeks.  She was given solumedrol initially which was transitioned to prednisone to complete a fast taper, decrease by 10mg  per day until off.  IR was not available to do epidural injections this week. Will need to schedule as an outpatient next week.  She continued Robaxin.  Patient refused PT evaluation 3/9 and 3/10 and was finally amenable to an assessment on 3/11.  She has been recommended for SNF.  She needs close follow up for injection and pain management.  SNF ([facility]) on 01/22/13.    Spinal stenosis CT lumbar spine with no interval progression of disc space narrowing, no discitis; DJD most severe at L1-L2, L5-S1.  MRI lumbar spine as above.   Abdominal pain was likely related to constipation and improved with BM.  CT abdomen pelvis revealed no acute abnormalities.  Hypertension, stable.  Continued atenolol   Depression, likely contributing to patient's pain control issues.  She continued Zoloft and resumed her xanax.   Constipation Colace and Senokot with magnesium citrate as needed.    Procedures:  CT ab/p  MRI lumbar spine  Consultations:  Neurosurgery  Discharge Exam: Filed Vitals:   01/22/13 0600  BP: 143/77  Pulse: 76  Temp: 98.5 F (36.9 C)  Resp: 18   Filed Vitals:   01/21/13 0555 01/21/13 1407  01/21/13 2200 01/22/13 0600  BP: 135/85 128/72 113/58 143/77  Pulse: 77 76 65 76  Temp: 98.1 F (36.7 C) 97.5 F (36.4 C) 98 F (36.7 C) 98.5 F (36.9 C)  TempSrc: Oral Oral Oral Oral  Resp: 16 18 18 18   Height:      Weight:      SpO2: 95% 94% 93% 93%    General: Pt is alert, follows commands appropriately, not in acute distress  HEENT: No icterus, No thrush, [place]/AT  Cardiovascular: RRR, S1/S2, no rubs, no gallops  Respiratory: CTA bilaterally, no wheezing, no crackles, no  rhonchi  Abdomen: Soft/+BS, non tender, non distended, no guarding  Extremities: No edema, No lymphangitis, No petechiae, No rashes, no synovitis Neuro:  Left lower extremty 4/5 limited by pain.  Other extremities 5/5.     Discharge Instructions      Discharge Orders   Future Orders Complete By Expires     Call MD for:  difficulty breathing, headache or visual disturbances  As directed     Call MD for:  extreme fatigue  As directed     Call MD for:  hives  As directed     Call MD for:  persistant dizziness or light-headedness  As directed     Call MD for:  persistant nausea and vomiting  As directed     Call MD for:  severe uncontrolled pain  As directed     Call MD for:  temperature >100.4  As directed     Diet - low sodium heart healthy  As directed     Discharge instructions  As directed     Comments:      You were hospitalized with severe back pain that may be related to some nerve compression of the lumbar back.  You were started on ms contin, gabapentin for your pain.  Continue oxycodone as needed for breakthrough pain.  Follow up with your primary care doctor in 1 week for reexamination.  You should see the pain clinic specialists and consider an injection to relieve your pain because narcotics are highly addictive and eventually lose their effect over time.  Your primary care doctor can give you a referral to interventional radiology if you choose to have this procedure.  Please continue physical therapy and take stool softeners to prevent constipation while on high dose narcotic medications.    Increase activity slowly  As directed         Medication List    STOP taking these medications       oxyCODONE-acetaminophen 10-325 MG per tablet  Commonly known as:  PERCOCET     ranitidine 150 MG capsule  Commonly known as:  ZANTAC      TAKE these medications       albuterol 108 (90 BASE) MCG/ACT inhaler  Commonly known as:  PROVENTIL HFA  Inhale 2 puffs into the lungs  every 4 (four) hours as needed for wheezing or shortness of breath.     ALPRAZolam 1 MG tablet  Commonly known as:  XANAX  Take 1 tablet (1 mg total) by mouth 2 (two) times daily as needed for sleep or anxiety.     aspirin 81 MG EC tablet  Take 81 mg by mouth daily.     atenolol 25 MG tablet  Commonly known as:  TENORMIN  Take 25 mg by mouth daily.     cetirizine-pseudoephedrine 5-120 MG per tablet  Commonly known as:  ZYRTEC-D  Take 1 tablet by  mouth 2 (two) times daily.     diclofenac sodium 1 % Gel  Commonly known as:  VOLTAREN  Apply 1 application topically every 6 (six) hours as needed (knee pain ).     DSS 100 MG Caps  Take 100 mg by mouth 2 (two) times daily.     gabapentin 100 MG capsule  Commonly known as:  NEURONTIN  Take 1 capsule (100 mg total) by mouth 3 (three) times daily.     magic mouthwash Soln  Take 5 mLs by mouth every 4 (four) hours.     methocarbamol 750 MG tablet  Commonly known as:  ROBAXIN  Take 750 mg by mouth 4 (four) times daily.     morphine 60 MG 12 hr tablet  Commonly known as:  MS CONTIN  Take 1 tablet (60 mg total) by mouth every 12 (twelve) hours.     multivitamin tablet  Take 1 tablet by mouth daily.     Oxycodone HCl 10 MG Tabs  Take 1 tablet (10 mg total) by mouth every 4 (four) hours as needed.     pantoprazole 40 MG tablet  Commonly known as:  PROTONIX  Take 1 tablet (40 mg total) by mouth daily.     predniSONE 10 MG tablet  Commonly known as:  DELTASONE  Prednisone 40mg  on 3/12, 30mg  on 3/13, 20mg  on 3/14, 10mg  on 3/15, then stop.     senna 8.6 MG Tabs  Commonly known as:  SENOKOT  Take 2 tablets (17.2 mg total) by mouth daily.     sertraline 100 MG tablet  Commonly known as:  ZOLOFT  Take 100 mg by mouth daily.     Vitamin D3 2000 UNITS Tabs  Take by mouth daily.       Follow-up Information   Follow up with FRY,STEPHEN A, MD. Schedule an appointment as soon as possible for a visit in 1 week.   Contact  information:   [address] Christena Flake [employer] [place] [address] [phone]       Follow up with Dorian Heckle, MD. Schedule an appointment as soon as possible for a visit in 2 weeks.   Contact information:   [address] [place] [address] [phone]        The results of significant diagnostics from this hospitalization (including imaging, microbiology, ancillary and laboratory) are listed below for reference.    Significant Diagnostic Studies: Ct Abdomen Pelvis Wo Contrast  01/17/2013  *RADIOLOGY REPORT*  Clinical Data: Increasing acute on chronic back pain  CT ABDOMEN AND PELVIS WITHOUT CONTRAST  Technique:  Multidetector CT imaging of the abdomen and pelvis was performed following the standard protocol without intravenous contrast.  Comparison: Concurrently obtained CT scan of the lumbar spine; prior MRI lumbar spine 03/20/2012; prior CT abdomen/pelvis 05/08/2008  Findings:  Lower Chest:  Mild atelectasis in the right lower lobe.  The lung bases are otherwise clear.  The visualized cardiac structures are within normal limits for size.  No pericardial effusion.  There is a small hiatal hernia.  Abdomen: Unenhanced CT was performed per clinician order.  Lack of IV contrast limits sensitivity and specificity, especially for evaluation of abdominal/pelvic solid viscera.  Within these limitations, unremarkable CT appearance of the stomach, duodenum, spleen, adrenal glands, pancreas and liver.  The gallbladder is surgically absent.  No significant biliary ductal dilatation.  Exophytic cystic lesion from the upper pole of the right kidney is incompletely characterized in the absence  of intravenous contrast material.  However, it remains unchanged in size and configuration dating back to June 2009 and is therefore likely a benign cyst.  No nephrolithiasis or hydronephrosis.  Calcifications in the right renal hilum are likely vascular.  Normal-caliber large  and small bowel throughout the abdomen.  No bowel obstruction.  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  No evidence of active inflammation.  Normal appendix in the right lower quadrant.  The terminal ileum is unremarkable.  A 9 mm peripherally calcified structure in the pericolonic fat adjacent to the sigmoid colon in the left lower quadrant may represent the sequela of prior epiploic appendagitis.  No free fluid or suspicious adenopathy.  Pelvis: Normal bladder, uterus and adnexa.  No free fluid or adenopathy.  Bones: No acute fracture or aggressive appearing lytic or blastic osseous lesion.  There is dextroconvex scoliosis of the lumbar spine and multilevel degenerative changes.  Please see dedicated CT scan of the lumbar spine for more detail.  Vascular: Limited evaluation in the absence of intravenous contrast material.  Scattered atherosclerotic vascular calcifications without aneurysmal dilatation.  IMPRESSION:  1.  No acute intra-abdominal or pelvic abnormality to explain the patient's clinical symptoms of back pain. 2.  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  Atherosclerotic vascular disease   Original Report Authenticated By: Malachy Moan, M.D.    Dg Hip Complete Left  01/17/2013  *RADIOLOGY REPORT*  Clinical Data: Left hip pain times 2 days, no known injury  LEFT HIP - COMPLETE 2+ VIEW  Comparison: Prior radiographs of the left hip 03/28/2012  Findings: The bones appear mildly osteopenic.  No evidence of acute fracture, or malalignment.  There is mild degenerative arthritis of both hip joints without significant interval progression compared to prior. Visualized bowel gas pattern is unremarkable. Degenerative changes are noted in the lower lumbar spine.  IMPRESSION:  1.  No acute findings in the pelvis or left hip and no significant interval change compared to 03/28/2012 2.  Mild osteoarthritis of the bilateral hips 3.   Osteopenia   Original Report Authenticated By: Malachy Moan, M.D.    Ct Lumbar Spine Wo Contrast  01/17/2013  *RADIOLOGY REPORT*  Clinical Data: Increasing acute on chronic back pain  CT LUMBAR SPINE WITHOUT CONTRAST  Technique:  Multidetector CT imaging of the lumbar spine was performed without intravenous contrast administration. Multiplanar CT image reconstructions were also generated.  Comparison: Concurrently obtained CT scan of the abdomen and pelvis; prior MRI of the lumbar spine 03/20/2012  Findings: Degenerative dextroconvex scoliosis centered at L2-L3 there is no evidence of acute fracture, malalignment, or aggressive lytic or blastic osseous lesion.  Multilevel degenerative disc disease is noted.  No destructive change, or significant interval progression of disc space narrowing compared to the prior study to suggest an underlying diskitis.  Degenerative changes are most significant at L1-L2 and L5-S1.  Prior left laminectomy at L5-S1. There is multilevel facet arthropathy more prominent on the right than the left.  Levels of greatest severity are L4-L5 and L5-S1.  IMPRESSION:  1.  No acute fracture, malalignment or aggressive lytic or blastic osseous lesion.  2.  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   Original Report Authenticated By: Malachy Moan, M.D.    Mr Lumbar Spine W Wo Contrast  01/18/2013  *RADIOLOGY REPORT*  Clinical Data: Severe  back pain with left groin and leg pain. Spinal stenosis.  MRI LUMBAR SPINE WITHOUT AND WITH CONTRAST  Technique:  Multiplanar and multiecho pulse sequences of the lumbar spine were obtained without and with intravenous contrast.  Contrast: 10mL MULTIHANCE GADOBENATE DIMEGLUMINE 529 MG/ML IV SOLN  Comparison:  CT lumbar 01/17/2013, MRI 03/20/2012.  Findings:  Dextroscoliosis.  Negative for fracture or mass lesion. Hemangioma S1 vertebral body is unchanged.  Conus medullaris is normal and terminates at L1-2.  L1-2:  Disc degeneration and spondylosis.  Mild facet hypertrophy without significant spinal stenosis.  No change from prior MRI.  L2-3:  Disc degeneration and spondylosis on the left with left foraminal narrowing.  Bilateral facet hypertrophy.  Mild narrowing of the spinal canal.  L3-4:  Disc degeneration and spondylosis.  Bilateral facet hypertrophy.  Mild foraminal narrowing bilaterally.  Mild central canal stenosis.  L4-5:  Disc degeneration and spondylosis with bilateral facet hypertrophy.  Moderate to severe spinal stenosis has progressed since 03/20/2012.  There is foraminal encroachment bilaterally.  L5-S1:  Prior laminectomy on the left.  Disc degeneration and spondylosis.  Right foraminal encroachment due to spurring and facet hypertrophy.  IMPRESSION: Mild spinal stenosis L2-3 and L3-4.  Progression of spinal stenosis at L4-5 which is now moderate to severe.  Postop laminectomy left L5-S1.  There is right foraminal encroachment due to spurring.   Original Report Authenticated By: Janeece Riggers, M.D.     Microbiology: Recent Results (from the past 240 hour(s))  URINE CULTURE     Status: None   Collection Time    01/17/13  6:25 PM      Result Value Range Status   Specimen Description URINE, CLEAN CATCH   Final   Special Requests NONE   Final   Culture  Setup Time 01/18/2013 00:49   Final   Colony Count 4,000 COLONIES/ML   Final   Culture INSIGNIFICANT GROWTH   Final   Report Status 01/19/2013 FINAL   Final     Labs: Basic Metabolic Panel:  Recent Labs Lab 01/17/13 1745 01/18/13 0535 01/19/13 0510 01/20/13 0447 01/21/13 0450  NA 137 137 134* 134* 137  K 3.9 4.2 4.1 3.8 3.8  CL 101 102 99 97 99  CO2 24 23 26 25 28   GLUCOSE 99 123* 128* 115* 118*  BUN 12 14 16 14 16   CREATININE 0.48* 0.44* 0.47* 0.45* 0.45*  CALCIUM 9.3 8.8 9.1  8.8 9.2   Liver Function Tests: No results found for this basename: AST, ALT, ALKPHOS, BILITOT, PROT, ALBUMIN,  in the last 168 hours No results found for this basename: LIPASE, AMYLASE,  in the last 168 hours No results found for this basename: AMMONIA,  in the last 168 hours CBC:  Recent Labs Lab 01/17/13 1745 01/18/13 0535  WBC 8.6 8.0  NEUTROABS 5.1  --   HGB 13.6 13.3  HCT 39.1 39.2  MCV 93.3 94.0  PLT 255 255   Cardiac Enzymes: No results found for this basename: CKTOTAL, CKMB, CKMBINDEX, TROPONINI,  in the last 168 hours BNP: BNP (last 3 results) No results found for this basename: PROBNP,  in the last 8760 hours CBG:  Recent Labs Lab 01/18/13 0745 01/19/13 0749 01/20/13 0722 01/21/13 0725 01/22/13 0708  GLUCAP 112* 124* 105* 109* 106*    Time coordinating discharge: 45 minutes  Signed:  SHORT, MACKENZIE  [facility] 01/22/2013, 8:42 AM

## 2013-01-22 NOTE — Progress Notes (Signed)
Clinical Social Work Department CLINICAL SOCIAL WORK PLACEMENT NOTE 01/22/2013  Patient:  Angelica [name], Angelica [name]  Account Number:  [id number] Admit date:  01/17/2013  Clinical Social Worker:  Becky Sax, LCSW  Date/time:  01/22/2013 12:00 M  Clinical Social Work is seeking post-discharge placement for this patient at the following level of care:   SKILLED NURSING   (*CSW will update this form in Epic as items are completed)   01/22/2013  Patient/family provided with [facility] Department of Clinical Social Work's list of facilities offering this level of care within the geographic area requested by the patient (or if unable, by the patient's family).  01/22/2013  Patient/family informed of their freedom to choose among providers that offer the needed level of care, that participate in Medicare, Medicaid or managed care program needed by the patient, have an available bed and are willing to accept the patient.  01/22/2013  Patient/family informed of [facility]' ownership interest in [facility], as well as of the fact that they are under no obligation to receive care at this facility.  PASARR submitted to [employer] on 01/22/2013 PASARR number received from [employer] on 01/22/2013  FL2 transmitted to all facilities in geographic area requested by pt/family on  01/22/2013 FL2 transmitted to all facilities within larger geographic area on 01/22/2013  Patient informed that his/her managed care company has contracts with or will negotiate with  certain facilities, including the following:     Patient/family informed of bed offers received:  01/22/2013 Patient chooses bed at [facility], [place] Physician recommends and patient chooses bed at  [facility], [place]  Patient to be transferred to [facility], [place] on  01/22/2013 Patient to be transferred to facility by ptar  The following physician request were entered in  Epic:   Additional Comments:

## 2013-01-22 NOTE — Progress Notes (Signed)
Patient appeared comfortable when transported by [facility] to [facility].

## 2013-01-22 NOTE — Progress Notes (Signed)
Discussed with patient having her PCP refer her to the pain clinic for assessment in managing her chronic back pain issues as an additional resource.

## 2013-01-22 NOTE — Progress Notes (Signed)
Telephone report given to [facility] at [employer].

## 2013-01-28 DIAGNOSIS — K5900 Constipation, unspecified: Secondary | ICD-10-CM | POA: Diagnosis not present

## 2013-01-28 DIAGNOSIS — I10 Essential (primary) hypertension: Secondary | ICD-10-CM | POA: Diagnosis not present

## 2013-01-28 DIAGNOSIS — M545 Low back pain: Secondary | ICD-10-CM | POA: Diagnosis not present

## 2013-01-29 ENCOUNTER — Ambulatory Visit: Payer: Medicare Other | Admitting: Family Medicine

## 2013-02-08 DIAGNOSIS — K5900 Constipation, unspecified: Secondary | ICD-10-CM | POA: Diagnosis not present

## 2013-02-08 DIAGNOSIS — I10 Essential (primary) hypertension: Secondary | ICD-10-CM | POA: Diagnosis not present

## 2013-02-08 DIAGNOSIS — M545 Low back pain: Secondary | ICD-10-CM | POA: Diagnosis not present

## 2013-02-17 ENCOUNTER — Ambulatory Visit (INDEPENDENT_AMBULATORY_CARE_PROVIDER_SITE_OTHER): Payer: Medicare Other | Admitting: Family Medicine

## 2013-02-17 ENCOUNTER — Encounter: Payer: Self-pay | Admitting: Family Medicine

## 2013-02-17 VITALS — BP 102/78 | HR 80 | Temp 98.2°F | Wt 116.0 lb

## 2013-02-17 DIAGNOSIS — R52 Pain, unspecified: Secondary | ICD-10-CM | POA: Diagnosis not present

## 2013-02-17 DIAGNOSIS — K5900 Constipation, unspecified: Secondary | ICD-10-CM

## 2013-02-17 DIAGNOSIS — M4800 Spinal stenosis, site unspecified: Secondary | ICD-10-CM

## 2013-02-17 DIAGNOSIS — I10 Essential (primary) hypertension: Secondary | ICD-10-CM | POA: Diagnosis not present

## 2013-02-17 MED ORDER — MORPHINE SULFATE ER 60 MG PO TBCR: 60.0000 mg | EXTENDED_RELEASE_TABLET | Freq: Two times a day (BID) | ORAL | Status: DC

## 2013-02-17 MED ORDER — OXYCODONE HCL 10 MG PO TABS: 10.0000 mg | ORAL_TABLET | Freq: Four times a day (QID) | ORAL | Status: DC | PRN

## 2013-02-17 MED ORDER — SERTRALINE HCL 100 MG PO TABS: 100.0000 mg | ORAL_TABLET | Freq: Every day | ORAL | Status: DC

## 2013-02-17 MED ORDER — ATENOLOL 25 MG PO TABS: 25.0000 mg | ORAL_TABLET | Freq: Every day | ORAL | Status: DC

## 2013-02-17 NOTE — Progress Notes (Signed)
  Subjective:    Patient ID: Angelica [name], female    DOB: 1940/12/14, 72 y.o.   MRN: [id number]  HPI Here to follow up after a hospital stay from 01-17-13 to 01-22-13, and then a rehab stay at [facility] nursing home until 02-14-13 for severe lo back pain due to spinal stenosis. She has done very well with reasonable pain control. She has been getting aggressive therapy and she feels much stronger. She will now get PT and OT 3 days a week at home. She is now living with her daughter, Angelica [name]. Her appetite has improved, and after losing a lot of weight in the hospital she is slowing gaining some back. She had been on Neurontin 200 mg tid but was having some memory issues, so now she is on 100 mg tid and doing well. She has been constipated despite using Miralax daily. Adds Colace.    Review of Systems  Constitutional: Negative.   Respiratory: Negative.   Cardiovascular: Negative.   Musculoskeletal: Positive for back pain.       Objective:   Physical Exam  Constitutional: She is oriented to person, place, and time. She appears well-developed and well-nourished.  Alert, walks with her walker   Cardiovascular: Normal rate, regular rhythm, normal heart sounds and intact distal pulses.   Pulmonary/Chest: Effort normal and breath sounds normal.  Neurological: She is alert and oriented to person, place, and time.          Assessment & Plan:  Doing well with her rehab. Refilled pain meds. Increase Miralax to twice a day.

## 2013-02-18 ENCOUNTER — Telehealth: Payer: Self-pay | Admitting: Family Medicine

## 2013-02-18 DIAGNOSIS — Z5189 Encounter for other specified aftercare: Secondary | ICD-10-CM | POA: Diagnosis not present

## 2013-02-18 DIAGNOSIS — R262 Difficulty in walking, not elsewhere classified: Secondary | ICD-10-CM | POA: Diagnosis not present

## 2013-02-18 DIAGNOSIS — M6281 Muscle weakness (generalized): Secondary | ICD-10-CM | POA: Diagnosis not present

## 2013-02-18 DIAGNOSIS — M48061 Spinal stenosis, lumbar region without neurogenic claudication: Secondary | ICD-10-CM | POA: Diagnosis not present

## 2013-02-18 MED ORDER — GABAPENTIN 100 MG PO CAPS: 100.0000 mg | ORAL_CAPSULE | Freq: Three times a day (TID) | ORAL | Status: DC

## 2013-02-18 NOTE — Telephone Encounter (Signed)
Refill request for Gabapentin, okay per Dr. Clent Ridges to send in and I did send e-scribe, also spoke with pt.

## 2013-02-19 DIAGNOSIS — Z5189 Encounter for other specified aftercare: Secondary | ICD-10-CM | POA: Diagnosis not present

## 2013-02-19 DIAGNOSIS — M6281 Muscle weakness (generalized): Secondary | ICD-10-CM | POA: Diagnosis not present

## 2013-02-25 DIAGNOSIS — M6281 Muscle weakness (generalized): Secondary | ICD-10-CM | POA: Diagnosis not present

## 2013-02-25 DIAGNOSIS — Z5189 Encounter for other specified aftercare: Secondary | ICD-10-CM | POA: Diagnosis not present

## 2013-02-27 DIAGNOSIS — M6281 Muscle weakness (generalized): Secondary | ICD-10-CM | POA: Diagnosis not present

## 2013-02-27 DIAGNOSIS — Z5189 Encounter for other specified aftercare: Secondary | ICD-10-CM | POA: Diagnosis not present

## 2013-02-28 DIAGNOSIS — Z5189 Encounter for other specified aftercare: Secondary | ICD-10-CM | POA: Diagnosis not present

## 2013-02-28 DIAGNOSIS — M6281 Muscle weakness (generalized): Secondary | ICD-10-CM | POA: Diagnosis not present

## 2013-03-03 ENCOUNTER — Telehealth: Payer: Self-pay | Admitting: Family Medicine

## 2013-03-03 DIAGNOSIS — M6281 Muscle weakness (generalized): Secondary | ICD-10-CM | POA: Diagnosis not present

## 2013-03-03 DIAGNOSIS — Z5189 Encounter for other specified aftercare: Secondary | ICD-10-CM | POA: Diagnosis not present

## 2013-03-03 MED ORDER — OXYCODONE HCL 10 MG PO TABS: 10.0000 mg | ORAL_TABLET | Freq: Four times a day (QID) | ORAL | Status: DC | PRN

## 2013-03-03 NOTE — Telephone Encounter (Signed)
Pt stated that when she was in rehab, she was taking her "break through" medication of oxycodone, every 4 hours. She didn't realize that her new RX was only for every 6, so therefor she's going to run out in two days. Please assist.

## 2013-03-03 NOTE — Telephone Encounter (Signed)
I gave her another #120 but this MUST last her for 28 days. NO early refills

## 2013-03-03 NOTE — Telephone Encounter (Signed)
Called and spoke with pt about rx and advised that rx was ready for pick up.

## 2013-03-04 ENCOUNTER — Telehealth: Payer: Self-pay | Admitting: Family Medicine

## 2013-03-04 MED ORDER — OXYCODONE HCL 10 MG PO TABS: 10.0000 mg | ORAL_TABLET | ORAL | Status: DC | PRN

## 2013-03-04 NOTE — Telephone Encounter (Signed)
I spoke with pt and gave below information,

## 2013-03-04 NOTE — Telephone Encounter (Signed)
Tell her to bring in the old unfilled rx for [place] to destroy. Given a new rx to increase the dosing to every 4 hours (6 a day).

## 2013-03-04 NOTE — Telephone Encounter (Signed)
Spoke with pharmacy and pt will be able to get the script for Oxycodone filled on either 26 or 27. I also spoke with pt and she will pick up new script.

## 2013-03-11 DIAGNOSIS — Z5189 Encounter for other specified aftercare: Secondary | ICD-10-CM | POA: Diagnosis not present

## 2013-03-11 DIAGNOSIS — M6281 Muscle weakness (generalized): Secondary | ICD-10-CM | POA: Diagnosis not present

## 2013-03-18 ENCOUNTER — Encounter: Payer: Self-pay | Admitting: Family Medicine

## 2013-03-18 ENCOUNTER — Ambulatory Visit (INDEPENDENT_AMBULATORY_CARE_PROVIDER_SITE_OTHER): Payer: Medicare Other | Admitting: Family Medicine

## 2013-03-18 VITALS — BP 124/78 | HR 70 | Temp 98.2°F | Wt 112.0 lb

## 2013-03-18 DIAGNOSIS — R52 Pain, unspecified: Secondary | ICD-10-CM

## 2013-03-18 DIAGNOSIS — M4800 Spinal stenosis, site unspecified: Secondary | ICD-10-CM

## 2013-03-18 MED ORDER — MORPHINE SULFATE ER 60 MG PO TBCR: 60.0000 mg | EXTENDED_RELEASE_TABLET | Freq: Two times a day (BID) | ORAL | Status: DC

## 2013-03-18 MED ORDER — OXYCODONE HCL 10 MG PO TABS: 10.0000 mg | ORAL_TABLET | ORAL | Status: DC | PRN

## 2013-03-18 NOTE — Progress Notes (Signed)
  Subjective:    Patient ID: Angelica [name], female    DOB: [birth year], 72 y.o.   MRN: [id number]  HPI Here to follow up. She is doing well in general. She has finished rehab at this point and she is still doing some exercises on her own. Using her walker.    Review of Systems  Constitutional: Negative.   Musculoskeletal: Positive for back pain.       Objective:   Physical Exam  Constitutional: She appears well-developed and well-nourished.  Walks with her walker           Assessment & Plan:  Refilled meds. Stick with home exercises

## 2013-03-22 IMAGING — CR DG HIP (WITH OR WITHOUT PELVIS) 2-3V*L*
3 series · 3 of 3 positions shown · non-contrast
Comparison: Prior radiographs of the left hip 03/28/2012

CLINICAL DATA: Left hip pain times 2 days, no known injury

LEFT HIP - COMPLETE 2+ VIEW

[t pelvis ap]
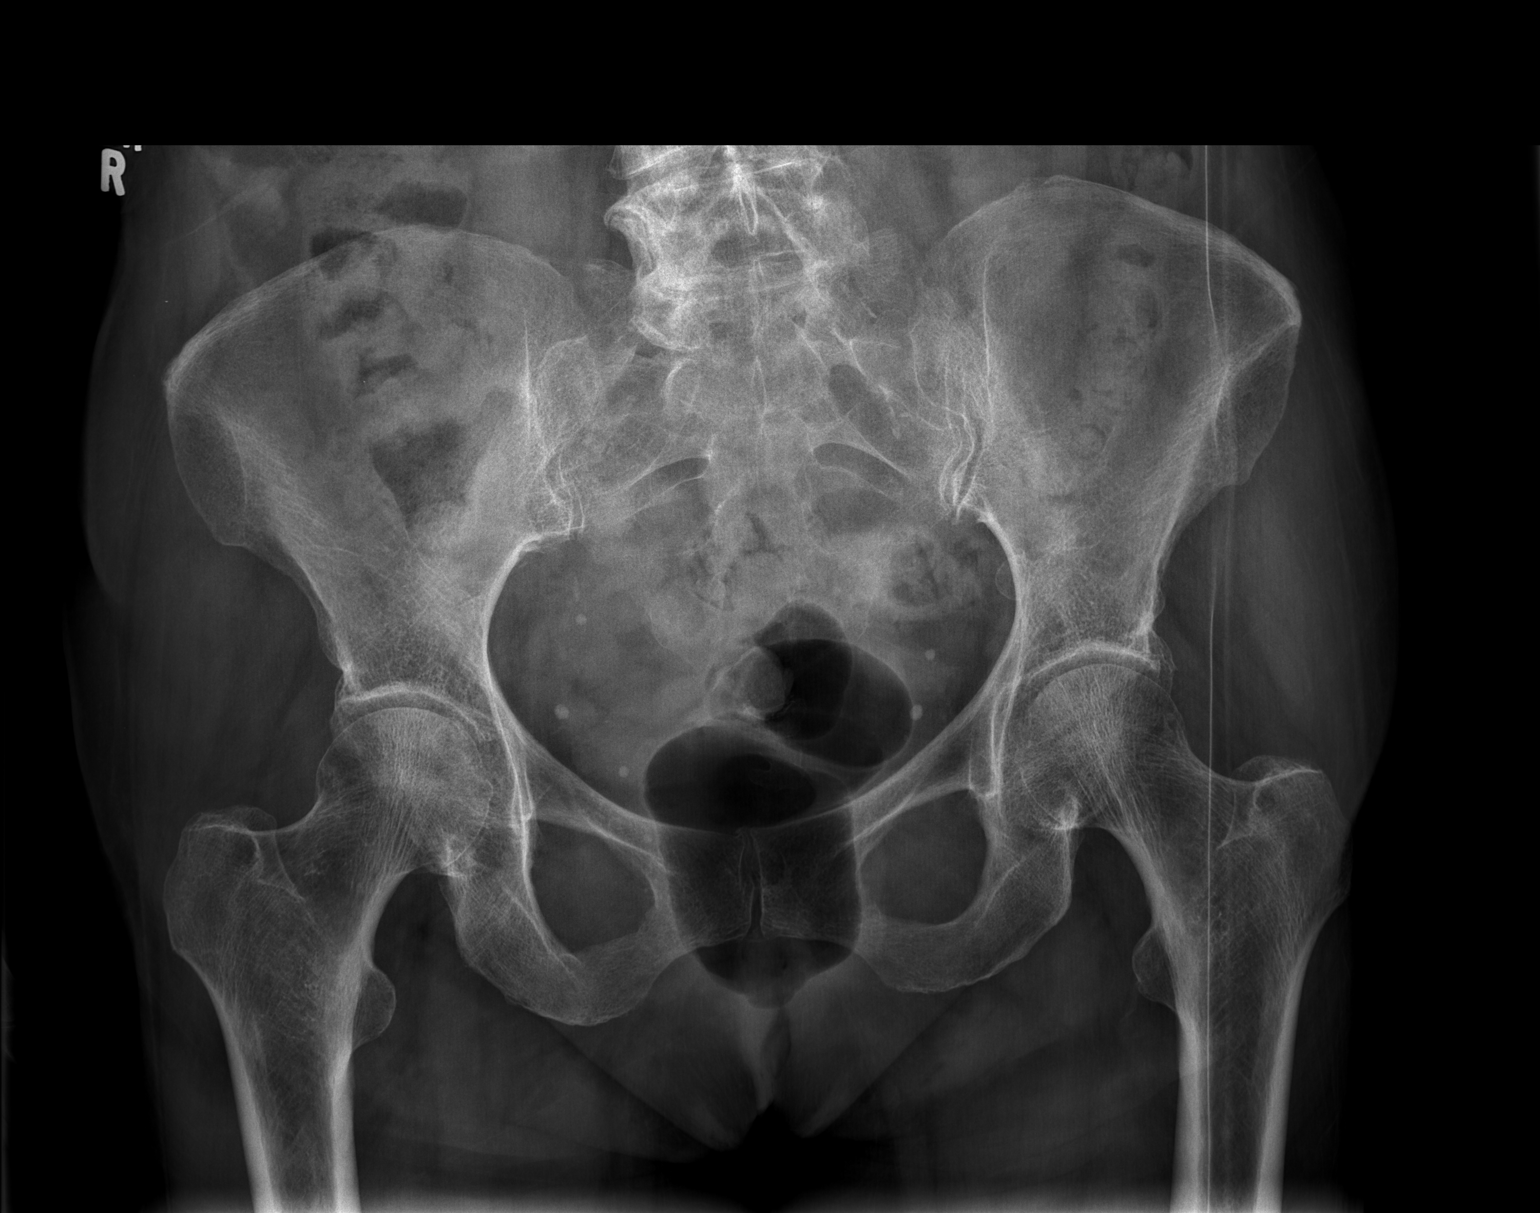

[t hip ap left]
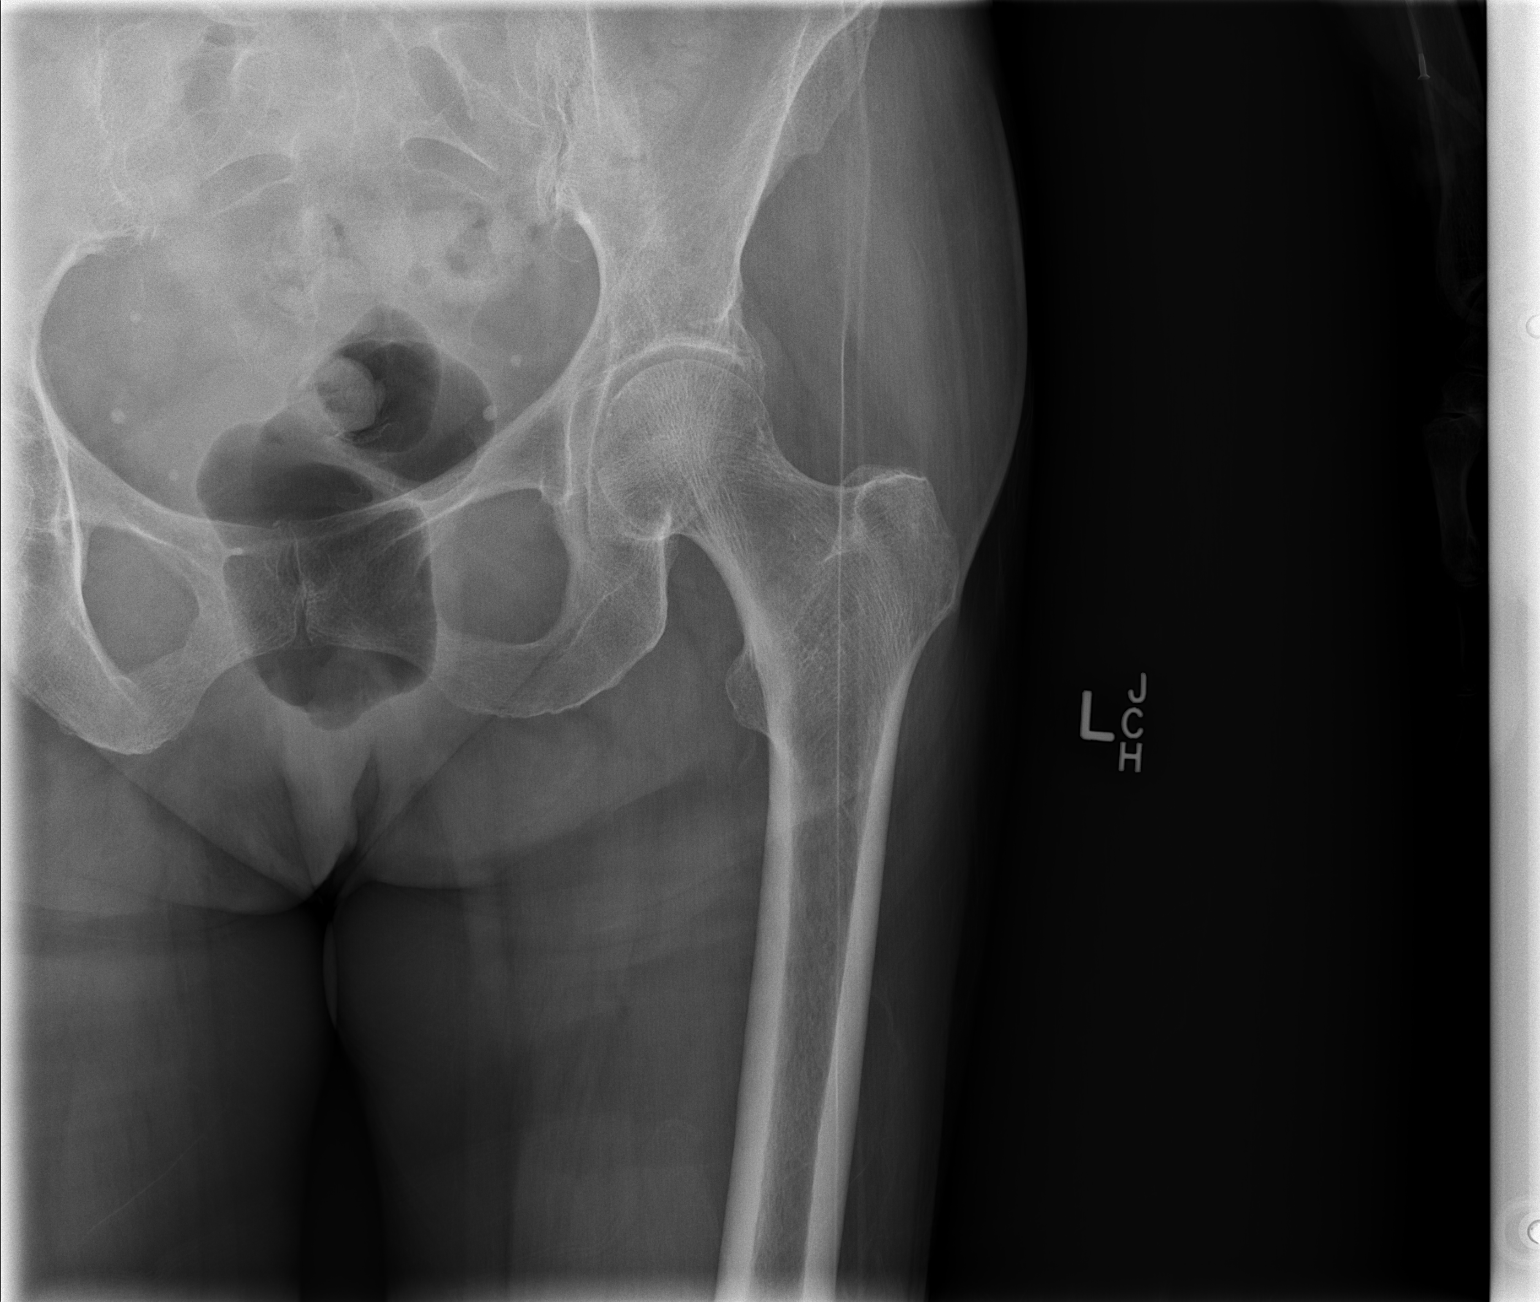

[t hip frog leg left]
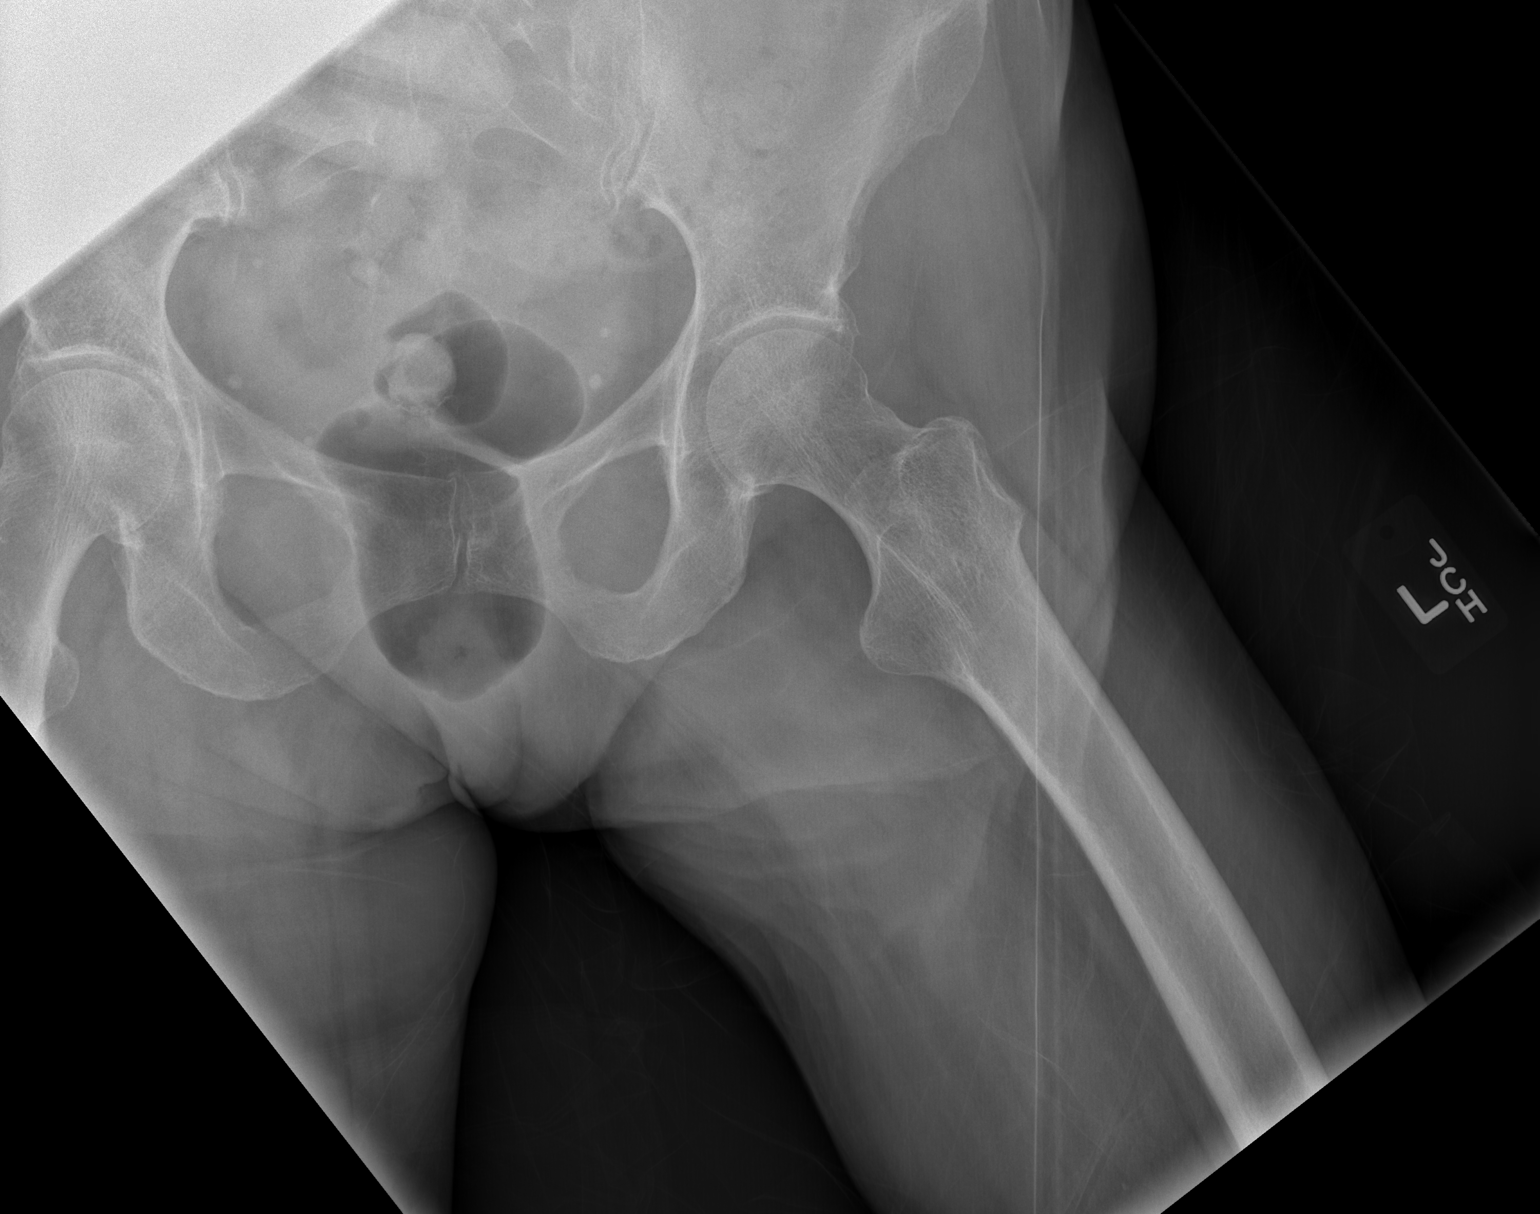

[3 of 3 positions shown; findings below may reference images not displayed]

FINDINGS: The bones appear mildly osteopenic.  No evidence of acute
fracture, or malalignment.  There is mild degenerative arthritis of
both hip joints without significant interval progression compared
to prior. Visualized bowel gas pattern is unremarkable.
Degenerative changes are noted in the lower lumbar spine.
IMPRESSION: 1.  No acute findings in the pelvis or left hip and no significant
interval change compared to 03/28/2012
2.  Mild osteoarthritis of the bilateral hips
3.  Osteopenia

## 2013-03-22 IMAGING — CT CT ABD-PELV W/O CM
1 series · 13 of 20 positions shown, 18 images · non-contrast
Comparison: Concurrently obtained CT scan of the lumbar spine;
prior MRI lumbar spine 03/20/2012; prior CT abdomen/pelvis
05/08/2008

CLINICAL DATA: Increasing acute on chronic back pain

CT ABDOMEN AND PELVIS WITHOUT CONTRAST
TECHNIQUE: Multidetector CT imaging of the abdomen and pelvis was
performed following the standard protocol without intravenous
contrast.

[Series 4: lung · axial · 0.63mm/px · z∈[+1306,+1391]mm · 13 of 20 slices shown, 18 images]
[im 2/20  soft-tissue]
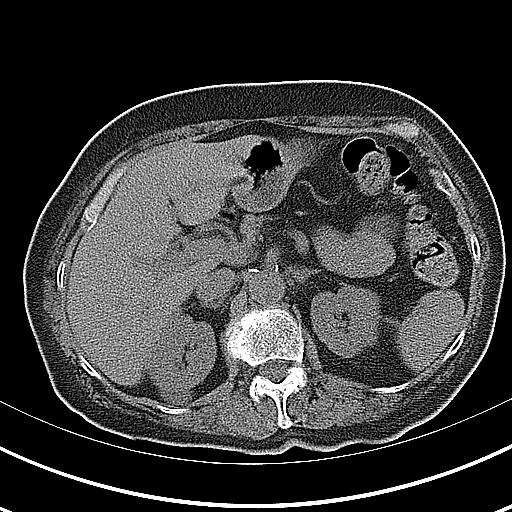
[im 2/20  bone]
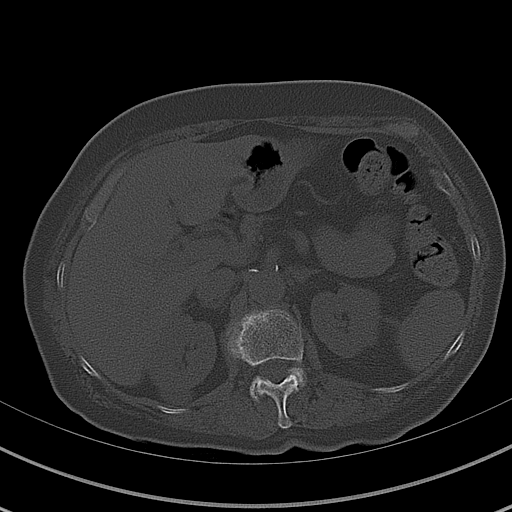
[im 4/20  soft-tissue]
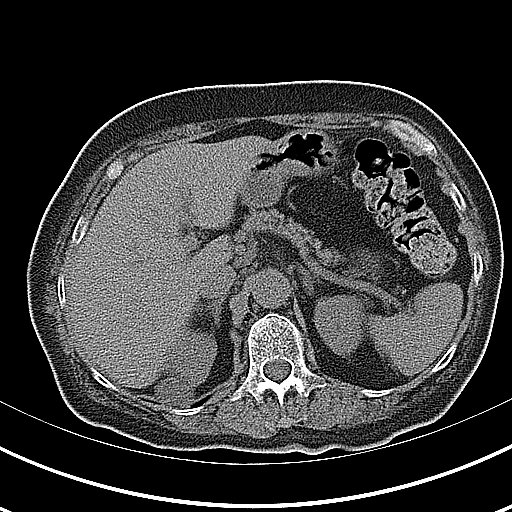
[im 5/20  soft-tissue]
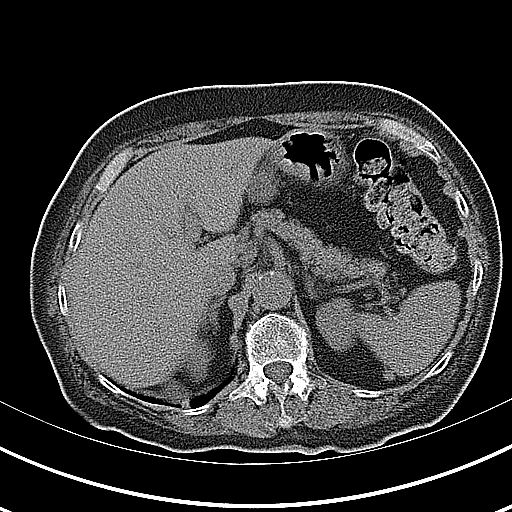
[im 7/20  soft-tissue]
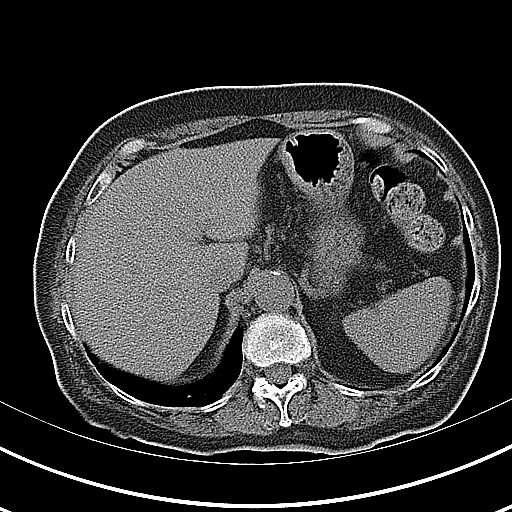
[im 8/20  soft-tissue]
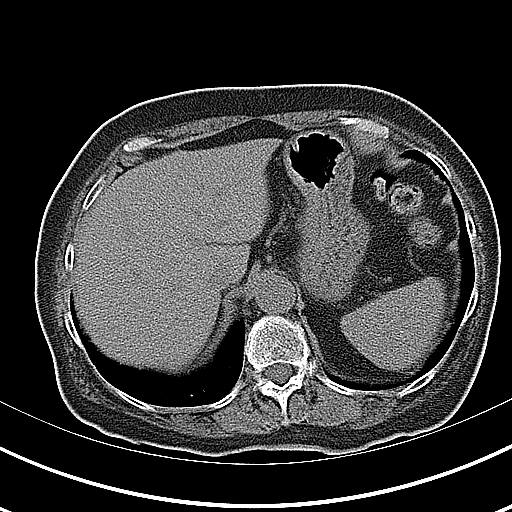
[im 10/20  soft-tissue]
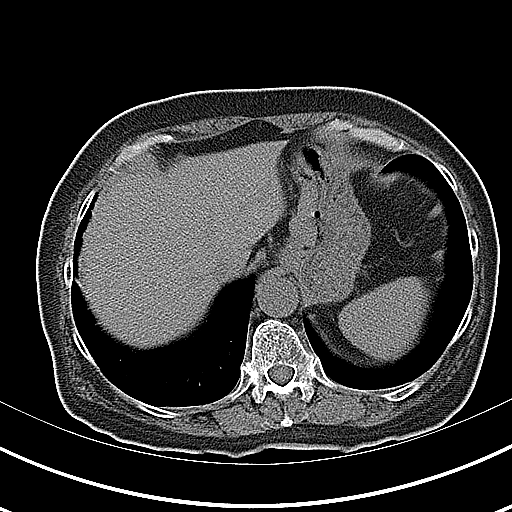
[im 11/20  soft-tissue]
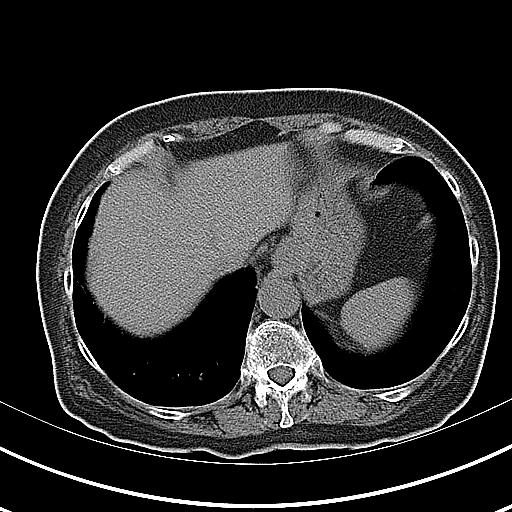
[im 13/20  soft-tissue]
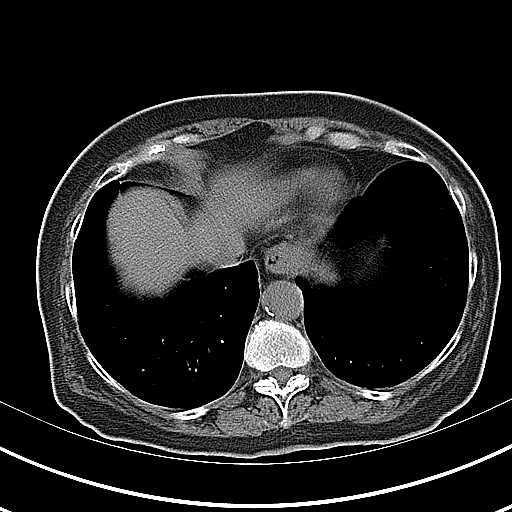
[im 14/20  soft-tissue]
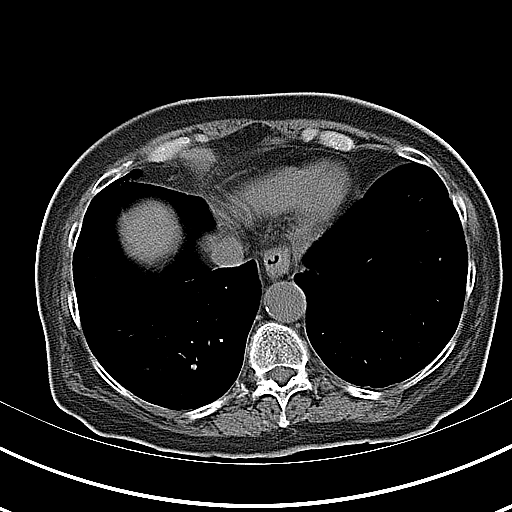
[im 14/20  bone]
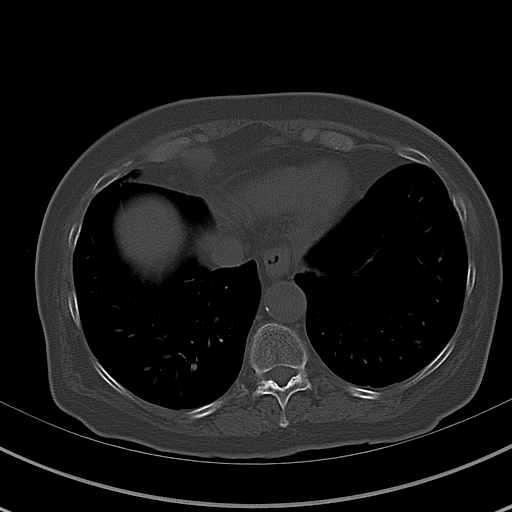
[im 16/20  soft-tissue]
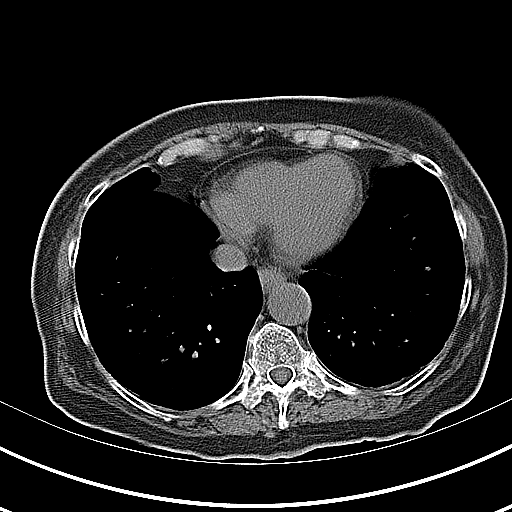
[im 16/20  lung]
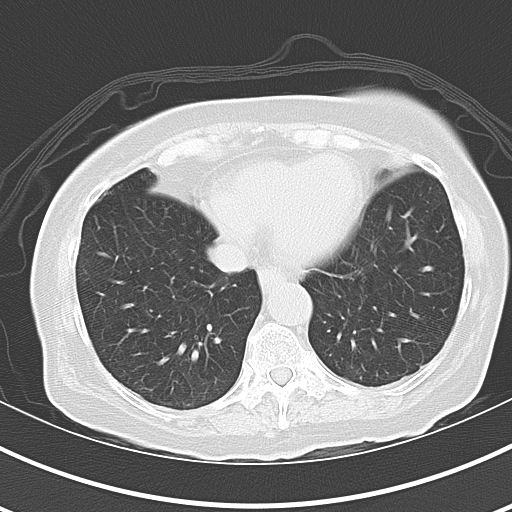
[im 17/20  soft-tissue]
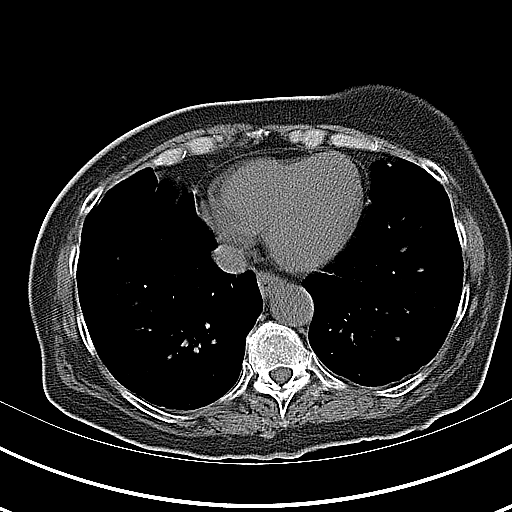
[im 17/20  lung]
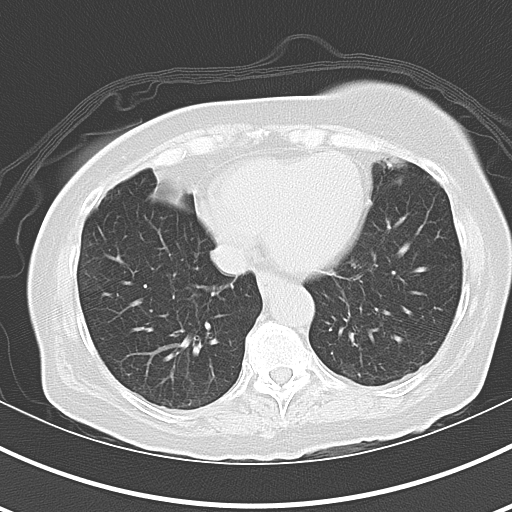
[im 18/20  lung]
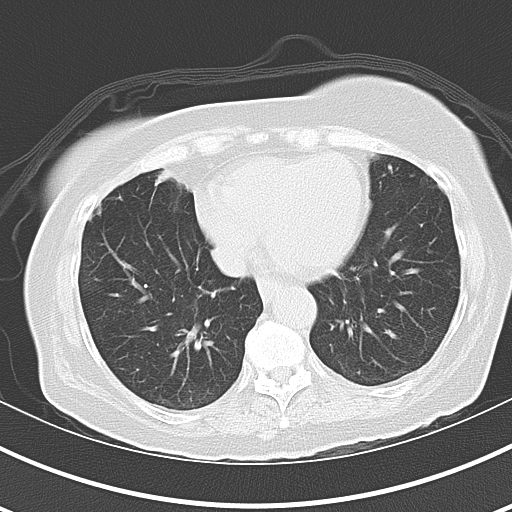
[im 19/20  soft-tissue]
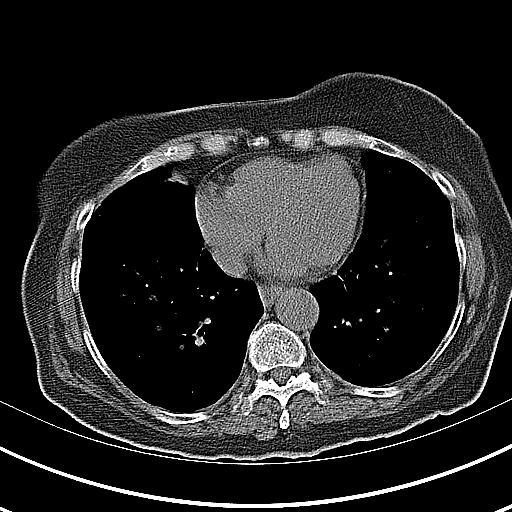
[im 19/20  lung]
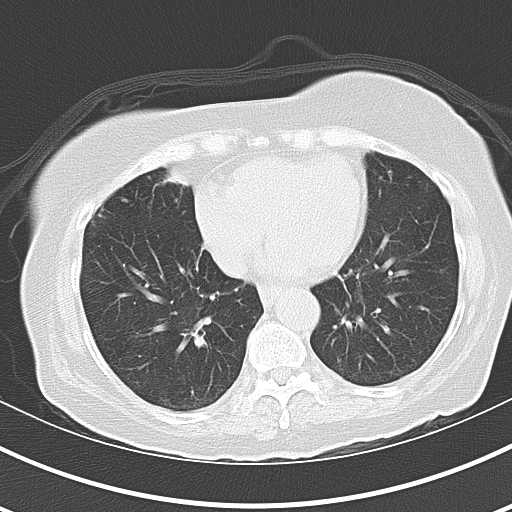

[13 of 20 positions shown; findings below may reference images not displayed]

FINDINGS: Lower Chest:  Mild atelectasis in the right lower lobe.  The lung
bases are otherwise clear.  The visualized cardiac structures are
within normal limits for size.  No pericardial effusion.  There is
a small hiatal hernia.

Abdomen: Unenhanced CT was performed per clinician order.  Lack of
IV contrast limits sensitivity and specificity, especially for
evaluation of abdominal/pelvic solid viscera.  Within these
limitations, unremarkable CT appearance of the stomach, duodenum,
spleen, adrenal glands, pancreas and liver.  The gallbladder is
surgically absent.  No significant biliary ductal dilatation.

Exophytic cystic lesion from the upper pole of the right kidney is
incompletely characterized in the absence of intravenous contrast
material.  However, it remains unchanged in size and configuration
dating back to April 2008 and is therefore likely a benign cyst.  No
nephrolithiasis or hydronephrosis.  Calcifications in the right
renal hilum are likely vascular.

Normal-caliber large and small bowel throughout the abdomen.  No
bowel obstruction.  Moderate colonic diverticular disease
predominantly involving the descending and sigmoid colon.  No
evidence of active inflammation.  Normal appendix in the right
lower quadrant.  The terminal ileum is unremarkable.  A 9 mm
peripherally calcified structure in the pericolonic fat adjacent to
the sigmoid colon in the left lower quadrant may represent the
sequela of prior epiploic appendagitis.

No free fluid or suspicious adenopathy.

Pelvis: Normal bladder, uterus and adnexa.  No free fluid or
adenopathy.

Bones: No acute fracture or aggressive appearing lytic or blastic
osseous lesion.  There is dextroconvex scoliosis of the lumbar
spine and multilevel degenerative changes.  Please see dedicated CT
scan of the lumbar spine for more detail.

Vascular: Limited evaluation in the absence of intravenous contrast
material.  Scattered atherosclerotic vascular calcifications
without aneurysmal dilatation.
IMPRESSION: 1.  No acute intra-abdominal or pelvic abnormality to explain the
patient's clinical symptoms of back pain.
2.  Colonic diverticular disease without active inflammation
3.  Atherosclerotic vascular disease

## 2013-03-28 ENCOUNTER — Other Ambulatory Visit: Payer: Self-pay | Admitting: Family Medicine

## 2013-03-28 MED ORDER — ALPRAZOLAM 1 MG PO TABS: 1.0000 mg | ORAL_TABLET | Freq: Two times a day (BID) | ORAL | Status: DC | PRN

## 2013-03-28 NOTE — Telephone Encounter (Signed)
Call in #60 with 5 rf 

## 2013-04-14 ENCOUNTER — Telehealth: Payer: Self-pay | Admitting: Family Medicine

## 2013-04-14 NOTE — Telephone Encounter (Signed)
She is not due to refill until 05-04-13

## 2013-04-14 NOTE — Telephone Encounter (Signed)
PT daughter called to request refill of pt's Oxycodone HCl 10 MG TABS. Please assist.

## 2013-04-15 MED ORDER — MORPHINE SULFATE ER 60 MG PO TBCR: 60.0000 mg | EXTENDED_RELEASE_TABLET | Freq: Two times a day (BID) | ORAL | Status: DC

## 2013-04-15 NOTE — Telephone Encounter (Signed)
done

## 2013-04-15 NOTE — Telephone Encounter (Signed)
Pt needs Contin, not the other one.

## 2013-04-15 NOTE — Telephone Encounter (Signed)
Script is ready for pick up and I spoke to pt's daughter.

## 2013-04-28 ENCOUNTER — Telehealth: Payer: Self-pay | Admitting: Family Medicine

## 2013-04-28 MED ORDER — OXYCODONE HCL 10 MG PO TABS: 10.0000 mg | ORAL_TABLET | ORAL | Status: DC | PRN

## 2013-04-28 NOTE — Telephone Encounter (Signed)
See below note.

## 2013-04-28 NOTE — Telephone Encounter (Signed)
done

## 2013-04-28 NOTE — Telephone Encounter (Signed)
reqeusting refill of Oxycodone HCl 10 MG TABS.  Will pick up when ready.

## 2013-04-29 NOTE — Telephone Encounter (Signed)
Script is ready for pick up and left voice message. 

## 2013-05-08 ENCOUNTER — Telehealth: Payer: Self-pay | Admitting: Family Medicine

## 2013-05-08 MED ORDER — MORPHINE SULFATE ER 60 MG PO TBCR: 60.0000 mg | EXTENDED_RELEASE_TABLET | Freq: Two times a day (BID) | ORAL | Status: DC

## 2013-05-08 NOTE — Telephone Encounter (Signed)
PT daughter calling to request a refill of the pt's morphine (MS CONTIN) 60 MG 12 hr tablet. Please assist.

## 2013-05-08 NOTE — Telephone Encounter (Signed)
Script is ready for pick up and I left voice message for pt. 

## 2013-05-08 NOTE — Telephone Encounter (Signed)
done

## 2013-05-15 ENCOUNTER — Telehealth: Payer: Self-pay | Admitting: Family Medicine

## 2013-05-15 MED ORDER — PROMETHAZINE HCL 25 MG RE SUPP: 25.0000 mg | RECTAL | Status: DC | PRN

## 2013-05-15 NOTE — Telephone Encounter (Signed)
[facility]

## 2013-05-15 NOTE — Telephone Encounter (Signed)
Call in Phenergan 25 mg suppositories to use q 4 hours prn nausea, #60 with 2 rf

## 2013-05-15 NOTE — Telephone Encounter (Signed)
Pt has been sick on stomach since 6 am. Pt unable to keep anything down, cannot keep meds down. Daughter would like a RX for PHENERGAN suppositories sent in to  [facility] [address]

## 2013-05-15 NOTE — Telephone Encounter (Signed)
Pt's daughter aware.

## 2013-05-15 NOTE — Telephone Encounter (Signed)
Opened in error/kh 

## 2013-06-04 ENCOUNTER — Ambulatory Visit (INDEPENDENT_AMBULATORY_CARE_PROVIDER_SITE_OTHER): Payer: Medicare Other | Admitting: Family Medicine

## 2013-06-04 ENCOUNTER — Encounter: Payer: Self-pay | Admitting: Family Medicine

## 2013-06-04 VITALS — BP 106/60 | HR 71 | Temp 98.0°F | Wt 110.0 lb

## 2013-06-04 DIAGNOSIS — M4800 Spinal stenosis, site unspecified: Secondary | ICD-10-CM | POA: Diagnosis not present

## 2013-06-04 DIAGNOSIS — F411 Generalized anxiety disorder: Secondary | ICD-10-CM | POA: Diagnosis not present

## 2013-06-04 DIAGNOSIS — I10 Essential (primary) hypertension: Secondary | ICD-10-CM | POA: Diagnosis not present

## 2013-06-04 DIAGNOSIS — G4700 Insomnia, unspecified: Secondary | ICD-10-CM

## 2013-06-04 MED ORDER — ALPRAZOLAM 2 MG PO TABS: 2.0000 mg | ORAL_TABLET | Freq: Three times a day (TID) | ORAL | Status: DC | PRN

## 2013-06-04 MED ORDER — MORPHINE SULFATE ER 60 MG PO TBCR: 60.0000 mg | EXTENDED_RELEASE_TABLET | Freq: Two times a day (BID) | ORAL | Status: DC

## 2013-06-04 NOTE — Progress Notes (Signed)
  Subjective:    Patient ID: Angelica [name], female    DOB: December 18, 1940, 72 y.o.   MRN: [id number]  HPI Here to follow up. She continues to slowly improve as far as strength and mobility. She uses her walker but is very mobile around the house. She is starting to help with preparing meals, washing dishes, doing laundry, etc. Her pain is well controlled. She asks if she can take more xanax however since she has a lot of anxiety some days. Sleeps well and her appetite is good.   Review of Systems  Constitutional: Negative.   Respiratory: Negative.   Cardiovascular: Negative.   Musculoskeletal: Positive for back pain.       Objective:   Physical Exam  Constitutional: She is oriented to person, place, and time. She appears well-developed and well-nourished.  Cardiovascular: Normal rate, regular rhythm, normal heart sounds and intact distal pulses.   Pulmonary/Chest: Effort normal and breath sounds normal.  Neurological: She is alert and oriented to person, place, and time.          Assessment & Plan:  She is doing quite well all in all. Stay on current pain meds but increase the Xanax to 2 mg tid prn. Recheck 3 months

## 2013-06-13 ENCOUNTER — Telehealth: Payer: Self-pay | Admitting: Family Medicine

## 2013-06-13 NOTE — Telephone Encounter (Signed)
Call in Diflucan 150 mg #1 with 5 rf 

## 2013-06-13 NOTE — Telephone Encounter (Signed)
Pt has blisters in mouth and is using magic mouthwash. Pt daughter is requesting diflucan because pt has develop a yeast inf in mouth. [facility] [address]

## 2013-07-08 ENCOUNTER — Telehealth: Payer: Self-pay | Admitting: Family Medicine

## 2013-07-08 NOTE — Telephone Encounter (Signed)
Pt request refill for morphine (MS CONTIN) 60 MG 12 hr tablet Pt would also like to request a rx for Oxycodone HCl 10 MG TABS and pre date  Pt has plenty, but does not drive and has to have someone bring her. Pt would keep script until ready to fill. Pt hates to bother someone to bring her to office 2 times. Pt would like to inform dr Clent Ridges she is home!! Pt has someone with her at all times!

## 2013-07-09 ENCOUNTER — Telehealth: Payer: Self-pay | Admitting: Family Medicine

## 2013-07-09 MED ORDER — METHOCARBAMOL 750 MG PO TABS: 750.0000 mg | ORAL_TABLET | Freq: Four times a day (QID) | ORAL | Status: DC

## 2013-07-09 MED ORDER — OXYCODONE HCL 10 MG PO TABS: 10.0000 mg | ORAL_TABLET | ORAL | Status: DC | PRN

## 2013-07-09 MED ORDER — MORPHINE SULFATE ER 60 MG PO TBCR: 60.0000 mg | EXTENDED_RELEASE_TABLET | Freq: Two times a day (BID) | ORAL | Status: DC

## 2013-07-09 NOTE — Telephone Encounter (Signed)
done

## 2013-07-09 NOTE — Telephone Encounter (Signed)
Angelica [name] - I don't see where she has a current rx for this written by Dr. Clent Ridges. Is someone else managing this for her now? Please advise. I cannot do prior auth unless it is written by [place]. Thanks.

## 2013-07-09 NOTE — Telephone Encounter (Signed)
Scripts are ready for pick up and I spoke with pt. 

## 2013-07-09 NOTE — Telephone Encounter (Signed)
Pt states [facility] advised pt she needs a prior auth for this med. Pt has been on for years.  methocarbamol (ROBAXIN) 750 MG tablet Pls advise. Pt is out of this med today.

## 2013-07-10 ENCOUNTER — Other Ambulatory Visit: Payer: Self-pay

## 2013-07-10 MED ORDER — BACLOFEN 10 MG PO TABS: 10.0000 mg | ORAL_TABLET | Freq: Three times a day (TID) | ORAL | Status: DC | PRN

## 2013-07-10 NOTE — Telephone Encounter (Signed)
Per Dr. Clent Ridges, please send baclofen 10mg  tid prn spasms #90 with 5 rf  [facility] and pt aware

## 2013-07-30 ENCOUNTER — Telehealth: Payer: Self-pay | Admitting: Family Medicine

## 2013-07-30 NOTE — Telephone Encounter (Signed)
NO, no refills until 08-09-13.

## 2013-07-30 NOTE — Telephone Encounter (Signed)
Pt request refill °Oxycodone HCl 10 MG TABS °

## 2013-08-01 NOTE — Telephone Encounter (Signed)
I left voice message with below message.

## 2013-08-01 NOTE — Telephone Encounter (Signed)
Pt found that she had turned in predated rx to pharm on 8/27.  Pt states she is very sorry she bothered you.

## 2013-08-21 ENCOUNTER — Encounter: Payer: Self-pay | Admitting: Family Medicine

## 2013-08-21 ENCOUNTER — Ambulatory Visit (INDEPENDENT_AMBULATORY_CARE_PROVIDER_SITE_OTHER): Payer: Medicare Other | Admitting: Family Medicine

## 2013-08-21 VITALS — BP 98/50 | HR 69 | Temp 98.3°F | Wt 114.0 lb

## 2013-08-21 DIAGNOSIS — J019 Acute sinusitis, unspecified: Secondary | ICD-10-CM

## 2013-08-21 MED ORDER — PROMETHAZINE HCL 25 MG PO TABS: 25.0000 mg | ORAL_TABLET | ORAL | Status: DC | PRN

## 2013-08-21 MED ORDER — FLUCONAZOLE 150 MG PO TABS: 150.0000 mg | ORAL_TABLET | Freq: Once | ORAL | Status: DC

## 2013-08-21 MED ORDER — CEFUROXIME AXETIL 500 MG PO TABS: 500.0000 mg | ORAL_TABLET | Freq: Two times a day (BID) | ORAL | Status: DC

## 2013-08-21 MED ORDER — MAGIC MOUTHWASH: 5.0000 mL | ORAL | Status: DC

## 2013-08-21 NOTE — Progress Notes (Signed)
  Subjective:    Patient ID: Angelica [name], female    DOB: 12-04-1940, 72 y.o.   MRN: [id number]  HPI Here for 5 days of sinus pressure, PND, and coughing up green sputum. No fever. On Zyrtec D.    Review of Systems  Constitutional: Negative.   HENT: Positive for congestion, postnasal drip and sinus pressure.   Eyes: Negative.   Respiratory: Positive for cough.        Objective:   Physical Exam  Constitutional: She appears well-developed and well-nourished.  HENT:  Right Ear: External ear normal.  Left Ear: External ear normal.  Nose: Nose normal.  Mouth/Throat: Oropharynx is clear and moist.  Eyes: Conjunctivae are normal.  Pulmonary/Chest: Effort normal and breath sounds normal.  Lymphadenopathy:    She has no cervical adenopathy.          Assessment & Plan:  Add Mucinex.

## 2013-10-01 ENCOUNTER — Telehealth: Payer: Self-pay | Admitting: Family Medicine

## 2013-10-01 MED ORDER — OXYCODONE HCL 10 MG PO TABS: 10.0000 mg | ORAL_TABLET | ORAL | Status: DC | PRN

## 2013-10-01 NOTE — Telephone Encounter (Signed)
Done but she needs drug testing

## 2013-10-01 NOTE — Telephone Encounter (Signed)
Pt requesting refill of Oxycodone HCl 10 MG TABS.  Please call when available for pick up.

## 2013-10-02 NOTE — Telephone Encounter (Signed)
I left voice message, script is ready for pick up and also I printed the contract.

## 2013-10-07 DIAGNOSIS — Z79899 Other long term (current) drug therapy: Secondary | ICD-10-CM | POA: Diagnosis not present

## 2013-10-28 ENCOUNTER — Telehealth: Payer: Self-pay | Admitting: Family Medicine

## 2013-10-28 MED ORDER — OXYCODONE HCL 10 MG PO TABS: 10.0000 mg | ORAL_TABLET | ORAL | Status: DC | PRN

## 2013-10-28 NOTE — Telephone Encounter (Signed)
Done but she needs testing and a contract  

## 2013-10-28 NOTE — Telephone Encounter (Signed)
Pt request refill of OXYCODONE HCL 10 MG PO TABS Pt realized its a little early, but wanted to give plenty of time.

## 2013-10-29 NOTE — Telephone Encounter (Signed)
Contract printed with envelope.  Called and left a message for pt making aware rx ready for pick up.

## 2013-10-30 DIAGNOSIS — Z79899 Other long term (current) drug therapy: Secondary | ICD-10-CM | POA: Diagnosis not present

## 2013-10-31 ENCOUNTER — Encounter: Payer: Self-pay | Admitting: Family Medicine

## 2013-11-25 ENCOUNTER — Telehealth: Payer: Self-pay | Admitting: Family Medicine

## 2013-11-25 NOTE — Telephone Encounter (Signed)
Pt request refill of Oxycodone HCl 10 MG TABS

## 2013-11-26 MED ORDER — OXYCODONE HCL 10 MG PO TABS: 10.0000 mg | ORAL_TABLET | ORAL | Status: DC | PRN

## 2013-11-26 NOTE — Telephone Encounter (Signed)
done

## 2013-11-27 ENCOUNTER — Ambulatory Visit (INDEPENDENT_AMBULATORY_CARE_PROVIDER_SITE_OTHER): Payer: Medicare Other

## 2013-11-27 DIAGNOSIS — Z23 Encounter for immunization: Secondary | ICD-10-CM

## 2013-11-27 NOTE — Telephone Encounter (Signed)
Pt aware.

## 2013-12-05 ENCOUNTER — Encounter: Payer: Self-pay | Admitting: Family Medicine

## 2013-12-18 ENCOUNTER — Telehealth: Payer: Self-pay | Admitting: Family Medicine

## 2013-12-18 NOTE — Telephone Encounter (Signed)
[facility] [facility] new script for gabapentin (NEURONTIN) 100 MG capsule

## 2013-12-19 MED ORDER — GABAPENTIN 100 MG PO CAPS: 100.0000 mg | ORAL_CAPSULE | Freq: Three times a day (TID) | ORAL | Status: DC

## 2013-12-19 NOTE — Telephone Encounter (Signed)
I sent script e-scribe. 

## 2013-12-22 ENCOUNTER — Telehealth: Payer: Self-pay | Admitting: Family Medicine

## 2013-12-22 MED ORDER — OXYCODONE HCL 10 MG PO TABS
10.0000 mg | ORAL_TABLET | ORAL | Status: DC | PRN
Start: 2013-12-22 — End: 2014-01-19

## 2013-12-22 NOTE — Telephone Encounter (Signed)
Given tho the patient

## 2013-12-31 ENCOUNTER — Telehealth: Payer: Self-pay | Admitting: Family Medicine

## 2013-12-31 MED ORDER — ALPRAZOLAM 2 MG PO TABS
2.0000 mg | ORAL_TABLET | Freq: Three times a day (TID) | ORAL | Status: DC | PRN
Start: 2013-12-31 — End: 2014-01-30

## 2013-12-31 NOTE — Telephone Encounter (Signed)
Rx called in to pharmacy. Left a message for pt to return call to office about testing.

## 2013-12-31 NOTE — Telephone Encounter (Signed)
[id number]  - [address] requesting refill of alprazolam (XANAX) 2 MG tablet

## 2013-12-31 NOTE — Telephone Encounter (Signed)
Call in #90 only, she needs testing and a contract  

## 2014-01-19 ENCOUNTER — Ambulatory Visit (INDEPENDENT_AMBULATORY_CARE_PROVIDER_SITE_OTHER): Payer: Medicare Other | Admitting: Family Medicine

## 2014-01-19 ENCOUNTER — Encounter: Payer: Self-pay | Admitting: Family Medicine

## 2014-01-19 VITALS — BP 98/60 | HR 60 | Temp 98.7°F | Ht 63.0 in | Wt 128.0 lb

## 2014-01-19 DIAGNOSIS — M48061 Spinal stenosis, lumbar region without neurogenic claudication: Secondary | ICD-10-CM | POA: Diagnosis not present

## 2014-01-19 DIAGNOSIS — I10 Essential (primary) hypertension: Secondary | ICD-10-CM

## 2014-01-19 DIAGNOSIS — E785 Hyperlipidemia, unspecified: Secondary | ICD-10-CM | POA: Diagnosis not present

## 2014-01-19 LAB — LIPID PANEL
CHOL/HDL RATIO: 5
Cholesterol: 257 mg/dL — ABNORMAL HIGH (ref 0–200)
HDL: 55.3 mg/dL (ref >39.00)
LDL CALC: 169 mg/dL — AB (ref 0–99)
TRIGLYCERIDES: 165 mg/dL — AB (ref 0.0–149.0)
VLDL: 33 mg/dL (ref 0.0–40.0)

## 2014-01-19 LAB — BASIC METABOLIC PANEL
BUN: 10 mg/dL (ref 6–23)
CALCIUM: 9.4 mg/dL (ref 8.4–10.5)
CO2: 27 mEq/L (ref 19–32)
CREATININE: 0.7 mg/dL (ref 0.4–1.2)
Chloride: 107 mEq/L (ref 96–112)
GFR: 85.87 mL/min (ref >60.00)
Glucose, Bld: 81 mg/dL (ref 70–99)
Potassium: 4.1 mEq/L (ref 3.5–5.1)
SODIUM: 142 meq/L (ref 135–145)

## 2014-01-19 LAB — CBC WITH DIFFERENTIAL/PLATELET
Basophils Absolute: 0 10*3/uL (ref 0.0–0.1)
Basophils Relative: 0.5 % (ref 0.0–3.0)
EOS PCT: 3.7 % (ref 0.0–5.0)
Eosinophils Absolute: 0.3 10*3/uL (ref 0.0–0.7)
HEMATOCRIT: 40 % (ref 36.0–46.0)
Hemoglobin: 13.5 g/dL (ref 12.0–15.0)
LYMPHS ABS: 2.5 10*3/uL (ref 0.7–4.0)
Lymphocytes Relative: 34.2 % (ref 12.0–46.0)
MCHC: 33.8 g/dL (ref 30.0–36.0)
MCV: 90.7 fl (ref 78.0–100.0)
MONOS PCT: 6 % (ref 3.0–12.0)
Monocytes Absolute: 0.4 10*3/uL (ref 0.1–1.0)
Neutro Abs: 4 10*3/uL (ref 1.4–7.7)
Neutrophils Relative %: 55.6 % (ref 43.0–77.0)
Platelets: 230 10*3/uL (ref 150.0–400.0)
RBC: 4.41 Mil/uL (ref 3.87–5.11)
RDW: 14.1 % (ref 11.5–14.6)
WBC: 7.3 10*3/uL (ref 4.5–10.5)

## 2014-01-19 LAB — POCT URINALYSIS DIPSTICK
BILIRUBIN UA: NEGATIVE
GLUCOSE UA: NEGATIVE
KETONES UA: NEGATIVE
Nitrite, UA: NEGATIVE
Protein, UA: NEGATIVE
SPEC GRAV UA: 1.025
Urobilinogen, UA: 0.2
pH, UA: 5.5

## 2014-01-19 LAB — HEPATIC FUNCTION PANEL
ALT: 16 U/L (ref 0–35)
AST: 22 U/L (ref 0–37)
Albumin: 4 g/dL (ref 3.5–5.2)
Alkaline Phosphatase: 85 U/L (ref 39–117)
BILIRUBIN DIRECT: 0 mg/dL (ref 0.0–0.3)
Total Bilirubin: 0.3 mg/dL (ref 0.3–1.2)
Total Protein: 7.8 g/dL (ref 6.0–8.3)

## 2014-01-19 LAB — TSH: TSH: 2.15 u[IU]/mL (ref 0.35–5.50)

## 2014-01-19 MED ORDER — OXYCODONE HCL 10 MG PO TABS: 10.0000 mg | ORAL_TABLET | ORAL | Status: DC | PRN

## 2014-01-19 NOTE — Progress Notes (Signed)
   Subjective:    Patient ID: Angelica [name], female    DOB: [birth year], 73 y.o.   MRN: [id number]  HPI Here for refills. She is doing well. She is fasting for labs.    Review of Systems  Constitutional: Negative.   Respiratory: Negative.   Cardiovascular: Negative.        Objective:   Physical Exam  Constitutional: She appears well-developed and well-nourished.  Cardiovascular: Normal rate, regular rhythm, normal heart sounds and intact distal pulses.   Pulmonary/Chest: Effort normal and breath sounds normal.          Assessment & Plan:  Refilled meds.

## 2014-01-19 NOTE — Progress Notes (Signed)
Pre visit review using our clinic review tool, if applicable. No additional management support is needed unless otherwise documented below in the visit note. 

## 2014-01-20 ENCOUNTER — Telehealth: Payer: Self-pay | Admitting: Family Medicine

## 2014-01-20 NOTE — Telephone Encounter (Signed)
Relevant patient education mailed to patient.  

## 2014-01-29 ENCOUNTER — Telehealth: Payer: Self-pay | Admitting: Family Medicine

## 2014-01-29 NOTE — Telephone Encounter (Signed)
[id number] - [place], [place] - [address] Requesting re-fill on alprazolam [name](XANAX) 2 MG tablet

## 2014-01-30 MED ORDER — ALPRAZOLAM 2 MG PO TABS: 2.0000 mg | ORAL_TABLET | Freq: Three times a day (TID) | ORAL | Status: DC | PRN

## 2014-01-30 NOTE — Telephone Encounter (Signed)
Call in #[phone] with 5 rf 

## 2014-02-23 ENCOUNTER — Telehealth: Payer: Self-pay | Admitting: Family Medicine

## 2014-02-23 NOTE — Telephone Encounter (Signed)
[id number] - [place], [place] - [address] Is requesting re-fill on sertraline (ZOLOFT) 100 MG tablet

## 2014-02-24 NOTE — Telephone Encounter (Signed)
Refill for one year 

## 2014-02-25 MED ORDER — SERTRALINE HCL 100 MG PO TABS: 100.0000 mg | ORAL_TABLET | Freq: Every day | ORAL | Status: DC

## 2014-02-25 NOTE — Telephone Encounter (Signed)
Rx sent for 1 year

## 2014-03-11 ENCOUNTER — Telehealth: Payer: Self-pay | Admitting: Family Medicine

## 2014-03-11 NOTE — Telephone Encounter (Signed)
[id number] - [place], [place] - [address] Is requesting re-fill on atenolol (TENORMIN) 25 MG tablet

## 2014-03-12 MED ORDER — ATENOLOL 25 MG PO TABS: 25.0000 mg | ORAL_TABLET | Freq: Every day | ORAL | Status: DC

## 2014-03-12 NOTE — Telephone Encounter (Signed)
I sent script e-scribe. 

## 2014-03-23 ENCOUNTER — Telehealth: Payer: Self-pay | Admitting: Family Medicine

## 2014-03-23 NOTE — Telephone Encounter (Signed)
Pt needs new rx oxycodone °

## 2014-03-24 NOTE — Telephone Encounter (Signed)
Left message for pt to call back  °

## 2014-03-24 NOTE — Telephone Encounter (Signed)
NO she is not due for refills until 04-21-14

## 2014-03-25 NOTE — Telephone Encounter (Signed)
I spoke with pt and she found the script.

## 2014-03-31 ENCOUNTER — Other Ambulatory Visit: Payer: Self-pay | Admitting: Family Medicine

## 2014-04-08 ENCOUNTER — Ambulatory Visit (INDEPENDENT_AMBULATORY_CARE_PROVIDER_SITE_OTHER): Payer: Medicare Other | Admitting: Family Medicine

## 2014-04-08 ENCOUNTER — Encounter: Payer: Self-pay | Admitting: Family Medicine

## 2014-04-08 VITALS — BP 102/64 | HR 70 | Temp 98.8°F | Ht 63.0 in | Wt 130.0 lb

## 2014-04-08 DIAGNOSIS — J209 Acute bronchitis, unspecified: Secondary | ICD-10-CM | POA: Diagnosis not present

## 2014-04-08 MED ORDER — FLUCONAZOLE 150 MG PO TABS: 150.0000 mg | ORAL_TABLET | Freq: Once | ORAL | Status: DC

## 2014-04-08 MED ORDER — OXYCODONE HCL 10 MG PO TABS: 10.0000 mg | ORAL_TABLET | ORAL | Status: DC | PRN

## 2014-04-08 MED ORDER — CEFUROXIME AXETIL 500 MG PO TABS: 500.0000 mg | ORAL_TABLET | Freq: Two times a day (BID) | ORAL | Status: DC

## 2014-04-08 NOTE — Progress Notes (Signed)
Pre visit review using our clinic review tool, if applicable. No additional management support is needed unless otherwise documented below in the visit note. 

## 2014-04-08 NOTE — Progress Notes (Signed)
   Subjective:    Patient ID: Angelica [name], female    DOB: 1941/02/18, 73 y.o.   MRN: [id number]  HPI Here for 5 days of chest tightness and coughing up yellow sputum. No fever. Her friend has a nebulizer with albuterol and she has been using this 2-3 times a day with excellent results.    Review of Systems  Constitutional: Negative.   HENT: Negative.   Eyes: Negative.   Respiratory: Positive for cough, chest tightness, shortness of breath and wheezing.        Objective:   Physical Exam  Constitutional: She appears well-developed and well-nourished.  Cardiovascular: Normal rate, regular rhythm, normal heart sounds and intact distal pulses.   Pulmonary/Chest: Effort normal. No respiratory distress. She has no rales.  Scattered rhonchi and wheezes           Assessment & Plan:  Recheck prn

## 2014-04-13 ENCOUNTER — Telehealth: Payer: Self-pay | Admitting: Family Medicine

## 2014-04-13 MED ORDER — HYDROCODONE-HOMATROPINE 5-1.5 MG/5ML PO SYRP: 5.0000 mL | ORAL_SOLUTION | ORAL | Status: DC | PRN

## 2014-04-13 NOTE — Telephone Encounter (Signed)
Pt's needs something to control her cough, pt is coughing all night long.  Please call her daughter Vivia Ewing.

## 2014-04-13 NOTE — Telephone Encounter (Signed)
Script is ready for pick up and I left a message for pt. 

## 2014-04-13 NOTE — Telephone Encounter (Signed)
rx is ready

## 2014-04-17 ENCOUNTER — Other Ambulatory Visit: Payer: Self-pay | Admitting: Family Medicine

## 2014-06-05 ENCOUNTER — Other Ambulatory Visit: Payer: Self-pay | Admitting: Family Medicine

## 2014-07-04 ENCOUNTER — Other Ambulatory Visit: Payer: Self-pay | Admitting: Family Medicine

## 2014-07-14 ENCOUNTER — Encounter: Payer: Self-pay | Admitting: Family Medicine

## 2014-07-14 ENCOUNTER — Ambulatory Visit (INDEPENDENT_AMBULATORY_CARE_PROVIDER_SITE_OTHER): Payer: Medicare Other | Admitting: Family Medicine

## 2014-07-14 VITALS — BP 96/52 | HR 66 | Temp 97.9°F | Ht 63.0 in | Wt 130.0 lb

## 2014-07-14 DIAGNOSIS — N814 Uterovaginal prolapse, unspecified: Secondary | ICD-10-CM | POA: Diagnosis not present

## 2014-07-14 DIAGNOSIS — M1990 Unspecified osteoarthritis, unspecified site: Secondary | ICD-10-CM

## 2014-07-14 DIAGNOSIS — M48061 Spinal stenosis, lumbar region without neurogenic claudication: Secondary | ICD-10-CM

## 2014-07-14 DIAGNOSIS — I10 Essential (primary) hypertension: Secondary | ICD-10-CM | POA: Diagnosis not present

## 2014-07-14 MED ORDER — OXYCODONE HCL 10 MG PO TABS: 10.0000 mg | ORAL_TABLET | ORAL | Status: DC | PRN

## 2014-07-14 NOTE — Progress Notes (Signed)
Pre visit review using our clinic review tool, if applicable. No additional management support is needed unless otherwise documented below in the visit note. 

## 2014-07-14 NOTE — Progress Notes (Signed)
   Subjective:    Patient ID: Angelica [name], female    DOB: [birth year], 73 y.o.   MRN: [id number]  HPI Here for refills and to ask about a pelvic problem. For the past 3 years she has noticed a slight protrusion of vaginal contents down into the vaginal opening. This does not bother her at all, no pain or discomfort. No vaginal bleeding or DC. No trouble urinating. Other wise she is doing well.    Review of Systems  Constitutional: Negative.   Respiratory: Negative.   Cardiovascular: Negative.   Genitourinary: Negative.   Musculoskeletal: Positive for back pain.       Objective:   Physical Exam  Constitutional: She appears well-developed and well-nourished.  Cardiovascular: Normal rate, regular rhythm, normal heart sounds and intact distal pulses.   Pulmonary/Chest: Effort normal and breath sounds normal.          Assessment & Plan:  Her HTN is stable. We refilled her pain med. It sounds like she has a prolapsed uterus, but she declined an exam today. We will refer her to GYN to evaluate.

## 2014-07-29 DIAGNOSIS — N814 Uterovaginal prolapse, unspecified: Secondary | ICD-10-CM | POA: Diagnosis not present

## 2014-07-29 DIAGNOSIS — N949 Unspecified condition associated with female genital organs and menstrual cycle: Secondary | ICD-10-CM | POA: Diagnosis not present

## 2014-07-29 DIAGNOSIS — Z124 Encounter for screening for malignant neoplasm of cervix: Secondary | ICD-10-CM | POA: Diagnosis not present

## 2014-07-29 DIAGNOSIS — N8110 Cystocele, unspecified: Secondary | ICD-10-CM | POA: Diagnosis not present

## 2014-08-04 ENCOUNTER — Other Ambulatory Visit: Payer: Self-pay | Admitting: Family Medicine

## 2014-08-05 DIAGNOSIS — N959 Unspecified menopausal and perimenopausal disorder: Secondary | ICD-10-CM | POA: Diagnosis not present

## 2014-08-05 DIAGNOSIS — M810 Age-related osteoporosis without current pathological fracture: Secondary | ICD-10-CM | POA: Diagnosis not present

## 2014-08-05 DIAGNOSIS — Z1231 Encounter for screening mammogram for malignant neoplasm of breast: Secondary | ICD-10-CM | POA: Diagnosis not present

## 2014-08-10 ENCOUNTER — Telehealth: Payer: Self-pay | Admitting: Family Medicine

## 2014-08-10 DIAGNOSIS — N8110 Cystocele, unspecified: Secondary | ICD-10-CM | POA: Diagnosis not present

## 2014-08-10 DIAGNOSIS — N949 Unspecified condition associated with female genital organs and menstrual cycle: Secondary | ICD-10-CM | POA: Diagnosis not present

## 2014-08-10 NOTE — Telephone Encounter (Signed)
[id number] - [place], [place] - [address] is requesting re-fill on alprazolam (XANAX) 2 MG tablet ° °

## 2014-08-10 NOTE — Telephone Encounter (Signed)
Call in #[phone] with 5 rf 

## 2014-08-12 MED ORDER — ALPRAZOLAM 2 MG PO TABS: 2.0000 mg | ORAL_TABLET | Freq: Three times a day (TID) | ORAL | Status: DC | PRN

## 2014-08-12 NOTE — Telephone Encounter (Signed)
2nd re-fill request was received for below medication

## 2014-08-12 NOTE — Telephone Encounter (Signed)
I called in script 

## 2014-08-21 DIAGNOSIS — N812 Incomplete uterovaginal prolapse: Secondary | ICD-10-CM | POA: Diagnosis not present

## 2014-08-25 ENCOUNTER — Other Ambulatory Visit: Payer: Self-pay | Admitting: Family Medicine

## 2014-08-27 DIAGNOSIS — N812 Incomplete uterovaginal prolapse: Secondary | ICD-10-CM | POA: Diagnosis not present

## 2014-09-22 DIAGNOSIS — N812 Incomplete uterovaginal prolapse: Secondary | ICD-10-CM | POA: Diagnosis not present

## 2014-10-12 ENCOUNTER — Telehealth: Payer: Self-pay | Admitting: Family Medicine

## 2014-10-12 MED ORDER — OXYCODONE HCL 10 MG PO TABS: 10.0000 mg | ORAL_TABLET | ORAL | Status: DC | PRN

## 2014-10-12 MED ORDER — OXYCODONE HCL 10 MG PO TABS
10.0000 mg | ORAL_TABLET | ORAL | Status: DC | PRN
Start: 2014-10-12 — End: 2014-12-23

## 2014-10-12 NOTE — Telephone Encounter (Signed)
Pt needs new rx oxycodone °

## 2014-10-12 NOTE — Telephone Encounter (Signed)
Script is ready for pick up and I spoke with pt.  

## 2014-10-12 NOTE — Telephone Encounter (Signed)
done

## 2014-10-21 ENCOUNTER — Ambulatory Visit (INDEPENDENT_AMBULATORY_CARE_PROVIDER_SITE_OTHER): Payer: Medicare Other | Admitting: Family Medicine

## 2014-10-21 DIAGNOSIS — Z23 Encounter for immunization: Secondary | ICD-10-CM | POA: Diagnosis not present

## 2014-10-28 ENCOUNTER — Other Ambulatory Visit: Payer: Self-pay | Admitting: Family Medicine

## 2014-11-07 ENCOUNTER — Other Ambulatory Visit: Payer: Self-pay | Admitting: Family Medicine

## 2014-11-24 DIAGNOSIS — N812 Incomplete uterovaginal prolapse: Secondary | ICD-10-CM | POA: Diagnosis not present

## 2014-12-21 ENCOUNTER — Telehealth: Payer: Self-pay | Admitting: Family Medicine

## 2014-12-21 NOTE — Telephone Encounter (Signed)
Pt request refill °Oxycodone HCl 10 MG TABS °

## 2014-12-21 NOTE — Telephone Encounter (Signed)
Pt cannot find script for 12/12/14.

## 2014-12-21 NOTE — Telephone Encounter (Signed)
I left a voice message for Angelica [name], she should have a script that was dated for 12/12/2014, should not need a refill until end of February.

## 2014-12-21 NOTE — Telephone Encounter (Signed)
Pt cannot find script for 12/12/2014.

## 2014-12-22 NOTE — Telephone Encounter (Signed)
I spoke with pharmacy and [facility]. Can we replace and if pt does find that printed script she will call us back?

## 2014-12-22 NOTE — Telephone Encounter (Signed)
Patient states that she still can't find the RX and would like a callback.

## 2014-12-23 MED ORDER — OXYCODONE HCL 10 MG PO TABS: 10.0000 mg | ORAL_TABLET | ORAL | Status: DC | PRN

## 2014-12-23 NOTE — Telephone Encounter (Signed)
Script is ready for pick up and I left a voice message for pt. 

## 2014-12-23 NOTE — Telephone Encounter (Signed)
Done for one month only  

## 2014-12-29 ENCOUNTER — Other Ambulatory Visit: Payer: Self-pay | Admitting: Family Medicine

## 2015-01-18 ENCOUNTER — Telehealth: Payer: Self-pay | Admitting: Family Medicine

## 2015-01-18 NOTE — Telephone Encounter (Signed)
Pt requesting refill of Oxycodone HCl 10 MG TABS ° °

## 2015-01-19 MED ORDER — OXYCODONE HCL 10 MG PO TABS: 10.0000 mg | ORAL_TABLET | ORAL | Status: DC | PRN

## 2015-01-19 NOTE — Telephone Encounter (Signed)
done

## 2015-01-19 NOTE — Telephone Encounter (Signed)
Script is ready for pick up and I left a voice message for pt. 

## 2015-02-20 ENCOUNTER — Other Ambulatory Visit: Payer: Self-pay | Admitting: Family Medicine

## 2015-02-22 ENCOUNTER — Telehealth: Payer: Self-pay | Admitting: Family Medicine

## 2015-02-22 MED ORDER — OXYCODONE HCL 10 MG PO TABS: 10.0000 mg | ORAL_TABLET | ORAL | Status: DC | PRN

## 2015-02-22 NOTE — Telephone Encounter (Signed)
° ° ° ° ° °  Pt request refill of the following: ° °Oxycodone HCl 10 MG TABS ° ° °Phamacy: °

## 2015-02-22 NOTE — Telephone Encounter (Signed)
done

## 2015-02-23 NOTE — Telephone Encounter (Signed)
Script is ready for pick up and I left a voice message.  

## 2015-03-16 ENCOUNTER — Telehealth: Payer: Self-pay | Admitting: Family Medicine

## 2015-03-16 MED ORDER — ALPRAZOLAM 2 MG PO TABS: 2.0000 mg | ORAL_TABLET | Freq: Three times a day (TID) | ORAL | Status: DC | PRN

## 2015-03-16 NOTE — Telephone Encounter (Signed)
I called in script 

## 2015-03-16 NOTE — Telephone Encounter (Signed)
Pt request refill of the following: alprazolam (XANAX) 2 MG tablet  Pt would like to have this rx called in today she is out of this med   [facility] [address]

## 2015-03-16 NOTE — Telephone Encounter (Signed)
Call in #[phone] with 5 rf 

## 2015-03-23 ENCOUNTER — Telehealth: Payer: Self-pay | Admitting: Family Medicine

## 2015-03-23 NOTE — Telephone Encounter (Signed)
° ° ° ° ° °  Pt request refill of the following: ° °Oxycodone HCl 10 MG TABS ° ° °Phamacy: °

## 2015-03-24 MED ORDER — OXYCODONE HCL 10 MG PO TABS: 10.0000 mg | ORAL_TABLET | ORAL | Status: DC | PRN

## 2015-03-24 NOTE — Telephone Encounter (Signed)
Script is ready for pick up and I left a voice message for pt. 

## 2015-03-24 NOTE — Telephone Encounter (Signed)
done

## 2015-03-30 ENCOUNTER — Other Ambulatory Visit: Payer: Self-pay | Admitting: Family Medicine

## 2015-04-06 ENCOUNTER — Telehealth: Payer: Self-pay

## 2015-04-06 NOTE — Telephone Encounter (Signed)
Left message for pt to call back concerning need for mammogram 

## 2015-04-07 ENCOUNTER — Encounter: Payer: Self-pay | Admitting: Family Medicine

## 2015-04-07 ENCOUNTER — Ambulatory Visit (INDEPENDENT_AMBULATORY_CARE_PROVIDER_SITE_OTHER): Payer: Medicare Other | Admitting: Family Medicine

## 2015-04-07 VITALS — BP 99/55 | HR 64 | Temp 98.5°F | Ht 63.0 in | Wt 133.0 lb

## 2015-04-07 DIAGNOSIS — J0100 Acute maxillary sinusitis, unspecified: Secondary | ICD-10-CM | POA: Diagnosis not present

## 2015-04-07 MED ORDER — OXYCODONE HCL 10 MG PO TABS
10.0000 mg | ORAL_TABLET | ORAL | Status: DC | PRN
Start: 2015-04-07 — End: 2015-04-07

## 2015-04-07 MED ORDER — OXYCODONE HCL 10 MG PO TABS: 10.0000 mg | ORAL_TABLET | ORAL | Status: DC | PRN

## 2015-04-07 MED ORDER — ALBUTEROL SULFATE HFA 108 (90 BASE) MCG/ACT IN AERS: 2.0000 | INHALATION_SPRAY | RESPIRATORY_TRACT | Status: DC | PRN

## 2015-04-07 NOTE — Progress Notes (Signed)
   Subjective:    Patient ID: Angelica [name], female    DOB: [birth year], [age] y.o.   MRN: [id number]  HPI Here for one week of sinus pressure, PND, ST, and a dry cough.    Review of Systems  Constitutional: Negative.   HENT: Positive for congestion, postnasal drip and sinus pressure.   Eyes: Negative.   Respiratory: Positive for cough.        Objective:   Physical Exam  Constitutional: She appears well-developed and well-nourished.  HENT:  Right Ear: External ear normal.  Left Ear: External ear normal.  Nose: Nose normal.  Mouth/Throat: Oropharynx is clear and moist.  Eyes: Conjunctivae are normal.  Pulmonary/Chest: Effort normal and breath sounds normal. No respiratory distress. She has no wheezes. She has no rales.  Lymphadenopathy:    She has no cervical adenopathy.          Assessment & Plan:  Sinusitis. Treat with Ceftin.

## 2015-04-07 NOTE — Progress Notes (Signed)
Pre visit review using our clinic review tool, if applicable. No additional management support is needed unless otherwise documented below in the visit note. 

## 2015-04-08 ENCOUNTER — Telehealth: Payer: Self-pay | Admitting: Family Medicine

## 2015-04-08 NOTE — Telephone Encounter (Signed)
Patient would like to know if Dr. Clent RidgesFry is still going to call in an anti-biotic for her sinus infection?  She would like a callback.  [id number] [place] [place], [place] - [address] [phone] (Phone) [phone] (Fax

## 2015-04-08 NOTE — Telephone Encounter (Signed)
Per Dr. Clent RidgesFry okay to call in script from visit on 04/07/15. I called in script and spoke with pt.

## 2015-04-23 ENCOUNTER — Telehealth: Payer: Self-pay | Admitting: Family Medicine

## 2015-04-23 NOTE — Telephone Encounter (Signed)
Please call the pharmacy to okay an early refill on Oxycodone. I should have written the rx to say "may fill on 04-24-15"

## 2015-04-23 NOTE — Telephone Encounter (Signed)
Called the pharmacy and spoke with Freida Busman and advised of Dr. Claris Che recommendations.  He verbalized understanding.   Called and left a message for pt to return call. Per Dr. Clent Ridges pt could refill rx for Oxycodone early but the other 2 prescriptions should be returned to office so that they can be re-printed.

## 2015-04-23 NOTE — Telephone Encounter (Signed)
Called and spoke with pt and pt is aware of recommendations.  Pt will bring the other 2 prescriptions back to the office next week.

## 2015-04-23 NOTE — Telephone Encounter (Signed)
Ok to refill early  

## 2015-04-23 NOTE — Telephone Encounter (Signed)
Pt will be out of med today

## 2015-04-23 NOTE — Telephone Encounter (Signed)
Pt states she was here 5/25 and dr fry wrote her Oxycodone HCl 10 MG TABS Pt states her previous rx was filled 5/12 but she saw dr fry on the 25 and he wrote new rx for 6/25.  Now she will be out of her med on Sunday. Needs someone to call the [facility]  [address] toapprove early refill

## 2015-05-10 ENCOUNTER — Telehealth: Payer: Self-pay | Admitting: Family Medicine

## 2015-05-10 NOTE — Telephone Encounter (Signed)
I called and left a voice message with the below information.

## 2015-05-10 NOTE — Telephone Encounter (Signed)
Pt has been taking the following med for tooth ache pain Oxycodone HCl 10 MG TABS. Pt is asking if he will change the rx to 2 10 or 1 20 every 4 hours . She said the pain is unbearable   She said she is having 2 teeth pulled tomorrow.     Pharmacy

## 2015-05-10 NOTE — Telephone Encounter (Signed)
Per Dr. Clent RidgesFry, pt can take Ibuprofen 3-4 tablets every 6 hours as needed. He does not want to increase the Oxycodone.

## 2015-05-12 ENCOUNTER — Telehealth: Payer: Self-pay | Admitting: Family Medicine

## 2015-05-12 MED ORDER — OXYCODONE HCL 10 MG PO TABS: 10.0000 mg | ORAL_TABLET | ORAL | Status: DC | PRN

## 2015-05-12 NOTE — Telephone Encounter (Signed)
done

## 2015-05-15 ENCOUNTER — Other Ambulatory Visit: Payer: Self-pay | Admitting: Family Medicine

## 2015-05-18 NOTE — Telephone Encounter (Signed)
Can we refill this? 

## 2015-05-25 ENCOUNTER — Telehealth: Payer: Self-pay | Admitting: Family Medicine

## 2015-05-25 NOTE — Telephone Encounter (Signed)
[facility], faxed over a verification as to why the pt has to have Oxycodone. If this form gets filled out, the pt will be able to get it much cheaper. Told pt the form was waiting to be seen by the dr and that it would be processed as soon as possible

## 2015-05-25 NOTE — Telephone Encounter (Signed)
I have not seen any such requests

## 2015-05-26 NOTE — Telephone Encounter (Signed)
Form was filled out and sent to be faxed.

## 2015-05-29 ENCOUNTER — Other Ambulatory Visit: Payer: Self-pay | Admitting: Family Medicine

## 2015-05-31 NOTE — Telephone Encounter (Signed)
Do we need to do anything else with this phone note?

## 2015-05-31 NOTE — Telephone Encounter (Signed)
Tier exception was approved.

## 2015-06-01 NOTE — Telephone Encounter (Signed)
No. Just an update.

## 2015-06-29 DIAGNOSIS — N812 Incomplete uterovaginal prolapse: Secondary | ICD-10-CM | POA: Diagnosis not present

## 2015-07-18 ENCOUNTER — Other Ambulatory Visit: Payer: Self-pay | Admitting: Family Medicine

## 2015-07-20 ENCOUNTER — Telehealth: Payer: Self-pay | Admitting: Family Medicine

## 2015-07-20 NOTE — Telephone Encounter (Signed)
Pt requesting refill of Oxycodone HCl 10 MG TABS ° °

## 2015-07-22 ENCOUNTER — Other Ambulatory Visit: Payer: Self-pay | Admitting: Family Medicine

## 2015-07-22 MED ORDER — OXYCODONE HCL 10 MG PO TABS: 10.0000 mg | ORAL_TABLET | ORAL | Status: DC | PRN

## 2015-07-22 NOTE — Telephone Encounter (Signed)
Script is ready for pick up and I spoke with pt.  

## 2015-07-22 NOTE — Telephone Encounter (Signed)
done

## 2015-09-23 DIAGNOSIS — Z23 Encounter for immunization: Secondary | ICD-10-CM | POA: Diagnosis not present

## 2015-10-15 ENCOUNTER — Telehealth: Payer: Self-pay | Admitting: Family Medicine

## 2015-10-15 MED ORDER — ALPRAZOLAM 2 MG PO TABS: 2.0000 mg | ORAL_TABLET | Freq: Three times a day (TID) | ORAL | Status: DC | PRN

## 2015-10-15 MED ORDER — OXYCODONE HCL 10 MG PO TABS: 10.0000 mg | ORAL_TABLET | ORAL | Status: DC | PRN

## 2015-10-15 NOTE — Telephone Encounter (Signed)
Call in Xanax #90 with 5 rf 

## 2015-10-15 NOTE — Telephone Encounter (Signed)
Also need rx for alprazolam

## 2015-10-15 NOTE — Telephone Encounter (Signed)
I called in script 

## 2015-10-15 NOTE — Telephone Encounter (Signed)
done

## 2015-10-15 NOTE — Telephone Encounter (Signed)
Ms. Angelica [name] called saying she needs a Rx for Oxycodone. She'd like a phone call if you have questions or concerns.  Pt ph# [phone] Thank you.

## 2015-10-28 DIAGNOSIS — N812 Incomplete uterovaginal prolapse: Secondary | ICD-10-CM | POA: Diagnosis not present

## 2015-11-01 ENCOUNTER — Other Ambulatory Visit: Payer: Self-pay | Admitting: Family Medicine

## 2016-01-11 ENCOUNTER — Telehealth: Payer: Self-pay | Admitting: Family Medicine

## 2016-01-11 NOTE — Telephone Encounter (Signed)
Pt request refill of the following: Oxycodone HCl 10 MG TABS  Pt said she calling early so that her daughter or son in law can pick up when either one of them pick there's up.    Phamacy:

## 2016-01-11 NOTE — Telephone Encounter (Signed)
Ok to refill 

## 2016-01-12 MED ORDER — OXYCODONE HCL 10 MG PO TABS: 10.0000 mg | ORAL_TABLET | ORAL | Status: DC | PRN

## 2016-01-12 NOTE — Telephone Encounter (Signed)
done

## 2016-01-12 NOTE — Telephone Encounter (Signed)
Patient notified that Rx ready for pick up.  

## 2016-01-17 ENCOUNTER — Other Ambulatory Visit: Payer: Self-pay | Admitting: *Deleted

## 2016-01-17 MED ORDER — GABAPENTIN 100 MG PO CAPS: ORAL_CAPSULE | ORAL | Status: DC

## 2016-02-11 ENCOUNTER — Encounter: Payer: Medicare Other | Admitting: Family Medicine

## 2016-02-15 ENCOUNTER — Encounter: Payer: Self-pay | Admitting: Family Medicine

## 2016-02-15 ENCOUNTER — Ambulatory Visit (INDEPENDENT_AMBULATORY_CARE_PROVIDER_SITE_OTHER): Payer: Medicare Other | Admitting: Family Medicine

## 2016-02-15 VITALS — BP 98/60 | HR 63 | Temp 98.2°F | Ht 63.0 in | Wt 128.0 lb

## 2016-02-15 DIAGNOSIS — J439 Emphysema, unspecified: Secondary | ICD-10-CM

## 2016-02-15 DIAGNOSIS — J209 Acute bronchitis, unspecified: Secondary | ICD-10-CM

## 2016-02-15 MED ORDER — CEFUROXIME AXETIL 500 MG PO TABS: 500.0000 mg | ORAL_TABLET | Freq: Two times a day (BID) | ORAL | Status: DC

## 2016-02-15 MED ORDER — ALBUTEROL SULFATE HFA 108 (90 BASE) MCG/ACT IN AERS: 2.0000 | INHALATION_SPRAY | RESPIRATORY_TRACT | Status: DC | PRN

## 2016-02-15 MED ORDER — BECLOMETHASONE DIPROPIONATE 40 MCG/ACT IN AERS: 2.0000 | INHALATION_SPRAY | Freq: Two times a day (BID) | RESPIRATORY_TRACT | Status: DC

## 2016-02-15 NOTE — Progress Notes (Signed)
Pre visit review using our clinic review tool, if applicable. No additional management support is needed unless otherwise documented below in the visit note. 

## 2016-02-15 NOTE — Progress Notes (Signed)
   Subjective:    Patient ID: Ferne ReusBetty J Vitiello, female    DOB: 1940/12/31, 75 y.o.   MRN: [id number]  HPI Here for 5 days of chest congestion and coughing up green sputum. No fever. Also she has had more trouble with SOB over the past 6 months. She tries to walk her dog several times a day and she cannot make it a full mile. She uses her Albuterol inhaler prn.    Review of Systems  Constitutional: Negative.   HENT: Positive for congestion and postnasal drip. Negative for sinus pressure and sore throat.   Eyes: Negative.   Respiratory: Positive for chest tightness, shortness of breath and wheezing.   Cardiovascular: Negative.        Objective:   Physical Exam  Constitutional: She appears well-developed and well-nourished.  HENT:  Right Ear: External ear normal.  Left Ear: External ear normal.  Nose: Nose normal.  Mouth/Throat: Oropharynx is clear and moist.  Eyes: Conjunctivae are normal.  Neck: No thyromegaly present.  Cardiovascular: Normal rate, regular rhythm, normal heart sounds and intact distal pulses.   Pulmonary/Chest: Effort normal. No respiratory distress. She has no rales.  Scattered rhonchi and wheezes  Lymphadenopathy:    She has no cervical adenopathy.          Assessment & Plan:  Treat the bronchitis with Ceftin. Try Qvar daily for the COPD. I again advised her to quit smoking.  Nelwyn SalisburyFRY,STEPHEN A, MD

## 2016-02-29 ENCOUNTER — Other Ambulatory Visit: Payer: Self-pay | Admitting: Family Medicine

## 2016-03-16 ENCOUNTER — Encounter: Payer: Self-pay | Admitting: Family Medicine

## 2016-03-16 ENCOUNTER — Ambulatory Visit (INDEPENDENT_AMBULATORY_CARE_PROVIDER_SITE_OTHER): Payer: Medicare Other | Admitting: Family Medicine

## 2016-03-16 VITALS — BP 102/58 | HR 71 | Temp 98.3°F | Ht 63.0 in | Wt 126.0 lb

## 2016-03-16 DIAGNOSIS — J019 Acute sinusitis, unspecified: Secondary | ICD-10-CM

## 2016-03-16 MED ORDER — HYDROCODONE-HOMATROPINE 5-1.5 MG/5ML PO SYRP: 5.0000 mL | ORAL_SOLUTION | ORAL | Status: DC | PRN

## 2016-03-16 MED ORDER — CEFUROXIME AXETIL 500 MG PO TABS: 500.0000 mg | ORAL_TABLET | Freq: Two times a day (BID) | ORAL | Status: DC

## 2016-03-16 NOTE — Progress Notes (Signed)
   Subjective:    Patient ID: Angelica ReusBetty J [name], female    DOB: 15-Jun-1941, 75 y.o.   MRN: [id number]  HPI Here for 3 days of sinus congestion and blowing green mucus from the nose. No fever or ST. She has a dry cough. We saw her one month ago for a bronchitis and she was treated with Ceftin. This worked very well and her chest has been clear since then. She is using Qvar daily.    Review of Systems  Constitutional: Negative.   HENT: Positive for congestion, postnasal drip and sinus pressure. Negative for ear pain and sore throat.   Eyes: Negative.   Respiratory: Positive for cough. Negative for shortness of breath and wheezing.   Cardiovascular: Negative.        Objective:   Physical Exam  Constitutional: She appears well-developed and well-nourished.  HENT:  Right Ear: External ear normal.  Left Ear: External ear normal.  Nose: Nose normal.  Mouth/Throat: Oropharynx is clear and moist.  Eyes: Conjunctivae are normal.  Neck: No thyromegaly present.  Cardiovascular: Normal rate, regular rhythm, normal heart sounds and intact distal pulses.   Pulmonary/Chest: Breath sounds normal. No respiratory distress. She has no wheezes. She has no rales.  Lymphadenopathy:    She has no cervical adenopathy.          Assessment & Plan:  Sinusitis, treat with Ceftin.  Nelwyn SalisburyFRY,STEPHEN A, MD

## 2016-03-16 NOTE — Progress Notes (Signed)
Pre visit review using our clinic review tool, if applicable. No additional management support is needed unless otherwise documented below in the visit note. 

## 2016-03-28 DIAGNOSIS — N390 Urinary tract infection, site not specified: Secondary | ICD-10-CM | POA: Diagnosis not present

## 2016-03-28 DIAGNOSIS — N812 Incomplete uterovaginal prolapse: Secondary | ICD-10-CM | POA: Diagnosis not present

## 2016-03-28 DIAGNOSIS — N760 Acute vaginitis: Secondary | ICD-10-CM | POA: Diagnosis not present

## 2016-04-11 ENCOUNTER — Telehealth: Payer: Self-pay | Admitting: Family Medicine

## 2016-04-11 MED ORDER — OXYCODONE HCL 10 MG PO TABS: 10.0000 mg | ORAL_TABLET | ORAL | Status: DC | PRN

## 2016-04-11 NOTE — Telephone Encounter (Signed)
done

## 2016-04-11 NOTE — Telephone Encounter (Signed)
Pt request refill °Oxycodone HCl 10 MG TABS °

## 2016-04-12 NOTE — Telephone Encounter (Signed)
Script is ready for pick up and I spoke with pt.  

## 2016-05-03 ENCOUNTER — Telehealth: Payer: Self-pay | Admitting: Family Medicine

## 2016-05-03 NOTE — Telephone Encounter (Signed)
Call in #[phone] with 5 rf 

## 2016-05-03 NOTE — Telephone Encounter (Signed)
Pt needs a refill on alprazolam call into [facility] [address]. Pt has an appt on 05-05-16

## 2016-05-05 ENCOUNTER — Ambulatory Visit: Payer: Medicare Other | Admitting: Family Medicine

## 2016-05-05 MED ORDER — VITAMIN D (ERGOCALCIFEROL) 1.25 MG (50000 UNIT) PO CAPS: 50000.0000 [IU] | ORAL_CAPSULE | ORAL | Status: DC

## 2016-05-05 MED ORDER — ALPRAZOLAM 2 MG PO TABS: 2.0000 mg | ORAL_TABLET | Freq: Three times a day (TID) | ORAL | Status: DC | PRN

## 2016-05-05 NOTE — Telephone Encounter (Signed)
I sent script e-scribe and spoke with pt. 

## 2016-05-05 NOTE — Telephone Encounter (Signed)
I called in script 

## 2016-05-05 NOTE — Telephone Encounter (Signed)
Pt would like to go back on Vitamin D 50,000 once a week, was on in the past and it did help with leg cramps.

## 2016-05-05 NOTE — Telephone Encounter (Signed)
Okay, call in the vit D to take weekly, #12 with one rf

## 2016-05-08 ENCOUNTER — Telehealth: Payer: Self-pay | Admitting: *Deleted

## 2016-05-08 NOTE — Telephone Encounter (Signed)
Received [facility] from [facility] stating that medicare will not cover Drisdol.  Called pharmacy, pharmacy states that the Rx is $9 cash price and the patient had already paid for the prescription out of pocket.   Denied because: Vitamins and minerals are excluded form coverage under the Medicare Part D program.

## 2016-05-24 ENCOUNTER — Other Ambulatory Visit: Payer: Self-pay | Admitting: Family Medicine

## 2016-05-31 ENCOUNTER — Other Ambulatory Visit: Payer: Self-pay | Admitting: Family Medicine

## 2016-05-31 NOTE — Telephone Encounter (Signed)
Ok to refill 

## 2016-06-23 ENCOUNTER — Telehealth: Payer: Self-pay | Admitting: Family Medicine

## 2016-06-23 MED ORDER — OXYCODONE HCL 10 MG PO TABS: 10.0000 mg | ORAL_TABLET | ORAL | 0 refills | Status: DC | PRN

## 2016-06-23 NOTE — Telephone Encounter (Signed)
done

## 2016-07-05 DIAGNOSIS — N812 Incomplete uterovaginal prolapse: Secondary | ICD-10-CM | POA: Diagnosis not present

## 2016-07-06 ENCOUNTER — Other Ambulatory Visit: Payer: Self-pay | Admitting: Family Medicine

## 2016-07-12 ENCOUNTER — Other Ambulatory Visit: Payer: Self-pay | Admitting: Family Medicine

## 2016-07-12 ENCOUNTER — Other Ambulatory Visit: Payer: Self-pay

## 2016-07-12 NOTE — Telephone Encounter (Signed)
Can we refill this? 

## 2016-07-19 ENCOUNTER — Encounter: Payer: Self-pay | Admitting: Family Medicine

## 2016-07-19 ENCOUNTER — Ambulatory Visit (INDEPENDENT_AMBULATORY_CARE_PROVIDER_SITE_OTHER): Payer: Medicare Other | Admitting: Family Medicine

## 2016-07-19 VITALS — BP 103/59 | HR 63 | Temp 97.8°F | Ht 63.0 in | Wt 131.0 lb

## 2016-07-19 DIAGNOSIS — L989 Disorder of the skin and subcutaneous tissue, unspecified: Secondary | ICD-10-CM

## 2016-07-19 DIAGNOSIS — I10 Essential (primary) hypertension: Secondary | ICD-10-CM

## 2016-07-19 DIAGNOSIS — M48061 Spinal stenosis, lumbar region without neurogenic claudication: Secondary | ICD-10-CM

## 2016-07-19 DIAGNOSIS — M1990 Unspecified osteoarthritis, unspecified site: Secondary | ICD-10-CM

## 2016-07-19 DIAGNOSIS — M4806 Spinal stenosis, lumbar region: Secondary | ICD-10-CM

## 2016-07-19 DIAGNOSIS — Z23 Encounter for immunization: Secondary | ICD-10-CM

## 2016-07-19 DIAGNOSIS — M4800 Spinal stenosis, site unspecified: Secondary | ICD-10-CM | POA: Diagnosis not present

## 2016-07-19 MED ORDER — MELOXICAM 15 MG PO TABS
15.0000 mg | ORAL_TABLET | Freq: Every day | ORAL | 3 refills | Status: DC
Start: 2016-07-19 — End: 2016-08-30

## 2016-07-19 NOTE — Progress Notes (Signed)
Pre visit review using our clinic review tool, if applicable. No additional management support is needed unless otherwise documented below in the visit note. 

## 2016-07-19 NOTE — Progress Notes (Signed)
   Subjective:    Patient ID: Angelica [name], female    DOB: May 10, 1941, 75 y.o.   MRN: [id number]  HPI Here with her daughter for several issues. First she has had more trouble with her chronic pain in the lower back and other joints lately. She has been on Oxycodone and Gabapentin for several years, but she has not taken an anti-inflammatory medication in that time. Also she is worried about several skin lesions and especially about one lesion on the left leg that she recently scratched off the top of.    Review of Systems  Constitutional: Negative.   Respiratory: Negative.   Cardiovascular: Negative.   Musculoskeletal: Positive for arthralgias and back pain.       Objective:   Physical Exam  Constitutional: She is oriented to person, place, and time. She appears well-developed and well-nourished.  Cardiovascular: Normal rate, regular rhythm, normal heart sounds and intact distal pulses.   Pulmonary/Chest: Effort normal and breath sounds normal.  Neurological: She is alert and oriented to person, place, and time.  Skin:  She has numerous seborrheic keratoses over the trunk, and arms, and legs. The spot in question is on the left lower leg. This is a round small macular excoriated spot.          Assessment & Plan:  For the chronic pain, we will get her back on a daily NSAID. Add Meloxicam 15 mg daily. She has numerous seborrheic keratoses over her skin, and I explained that these are benign and do not require intervention. The spot on the left lower leg was most likely another one of these lesions that she scratched off the top of. They will monitor this and recheck here if anything appears to grow back. Nelwyn SalisburyFRY,Tyrice Hewitt A, MD

## 2016-08-26 ENCOUNTER — Other Ambulatory Visit: Payer: Self-pay | Admitting: Family Medicine

## 2016-08-30 ENCOUNTER — Encounter: Payer: Self-pay | Admitting: Family Medicine

## 2016-08-30 ENCOUNTER — Ambulatory Visit (INDEPENDENT_AMBULATORY_CARE_PROVIDER_SITE_OTHER): Payer: Medicare Other | Admitting: Family Medicine

## 2016-08-30 VITALS — BP 116/68 | HR 67 | Temp 98.1°F | Ht 63.0 in | Wt 129.0 lb

## 2016-08-30 DIAGNOSIS — N3001 Acute cystitis with hematuria: Secondary | ICD-10-CM | POA: Diagnosis not present

## 2016-08-30 DIAGNOSIS — M48061 Spinal stenosis, lumbar region without neurogenic claudication: Secondary | ICD-10-CM

## 2016-08-30 DIAGNOSIS — M1990 Unspecified osteoarthritis, unspecified site: Secondary | ICD-10-CM

## 2016-08-30 LAB — POC URINALSYSI DIPSTICK (AUTOMATED)
BILIRUBIN UA: NEGATIVE
Glucose, UA: NEGATIVE
KETONES UA: NEGATIVE
PH UA: 6
Spec Grav, UA: >=1.03
Urobilinogen, UA: 0.2

## 2016-08-30 MED ORDER — OXYCODONE HCL 20 MG PO TABS: 1.0000 | ORAL_TABLET | ORAL | 0 refills | Status: DC | PRN

## 2016-08-30 MED ORDER — CIPROFLOXACIN HCL 500 MG PO TABS: 500.0000 mg | ORAL_TABLET | Freq: Two times a day (BID) | ORAL | 0 refills | Status: DC

## 2016-08-30 NOTE — Progress Notes (Signed)
Pre visit review using our clinic review tool, if applicable. No additional management support is needed unless otherwise documented below in the visit note. 

## 2016-08-30 NOTE — Progress Notes (Signed)
   Subjective:    Patient ID: Ferne ReusBetty J Reis, female    DOB: 03-01-1941, 75 y.o.   MRN: [id number]  HPI Here to follow up on back pain and for 4 days of urinary burning. No fever. She tried Meloxicam for 3 weeks but found it did not help her pain at all. Instead it caused a lot of GERD symptoms, so she stopped it. She is still getting a lot of breakthrough pain on Oxycodone 10 mg tabs.    Review of Systems  Constitutional: Negative.   Respiratory: Negative.   Cardiovascular: Negative.   Gastrointestinal: Negative.   Genitourinary: Positive for dysuria and urgency. Negative for flank pain and frequency.  Musculoskeletal: Positive for back pain.       Objective:   Physical Exam  Constitutional: She is oriented to person, place, and time. She appears well-developed and well-nourished.  Cardiovascular: Normal rate, regular rhythm, normal heart sounds and intact distal pulses.   Pulmonary/Chest: Effort normal and breath sounds normal.  Abdominal: Soft. Bowel sounds are normal. She exhibits no distension and no mass. There is no tenderness. There is no rebound and no guarding.  Neurological: She is alert and oriented to person, place, and time.          Assessment & Plan:  For the back pain, she will increase the Oxycodone to 20 mg as needed. For the UTI, we will start her on Cipro and culture the sample.  Nelwyn SalisburyFRY,Jaylianna Tatlock A, MD

## 2016-08-30 NOTE — Addendum Note (Signed)
Addended by: Aniceto BossNIMMONS, Cimone Fahey A on: 08/30/2016 04:37 PM   Modules accepted: Orders

## 2016-09-02 LAB — URINE CULTURE

## 2016-09-14 ENCOUNTER — Other Ambulatory Visit: Payer: Self-pay | Admitting: Family Medicine

## 2016-09-25 ENCOUNTER — Other Ambulatory Visit: Payer: Self-pay | Admitting: Family Medicine

## 2016-09-25 NOTE — Telephone Encounter (Signed)
Pt request refill  Oxycodone HCl 20 MG TABS  Daughter states she would like to pick up on Friday, due to going out of town and due on Sat.

## 2016-09-27 ENCOUNTER — Telehealth: Payer: Self-pay

## 2016-09-27 NOTE — Telephone Encounter (Signed)
09/26/16 Patient Name: Angelica [name] Gender: Female DOB: [birth year] Age: [age] Y 2 M 27 D Return Phone Number: [phone] (Primary) Address: [place]Zip[place] [address] Client [facility] Night - Client Client Site [facility] - Night Physician Gershon CraneFry, Stephen - MD Contact Type Call Who Is Calling Patient / Member / Family / Caregiver Call Type Triage / Clinical Caller Name Francine Gravenaylor Phagan Relationship To Patient Grandchild Return Phone Number [phone] (Primary) Chief Complaint ABDOMINAL PAIN - Severe and only in abdomen Reason for Call Symptomatic / Request for Health Information Initial Comment Caller stated grandmother has diverticulitis and is currently having a flare up that has been going on since Friday. Stated is vomiting and severe diarrhea and pain. PreDisposition Call Doctor Comments User: Jenene SlickerKimberly, Daves, RN Date/Time Lamount Cohen(Eastern Time): 09/26/2016 5:37:14 PM notified caller that she should call office in AM for appt tomorrow, verbalizes understanding   Per chart no appt made for patient. Called pt at home number and got no answer. LMTCB

## 2016-09-28 MED ORDER — OXYCODONE HCL 20 MG PO TABS: 1.0000 | ORAL_TABLET | ORAL | 0 refills | Status: DC | PRN

## 2016-09-28 NOTE — Telephone Encounter (Signed)
done

## 2016-09-28 NOTE — Telephone Encounter (Signed)
Left message on personal voicemail Rx ready for pickup. Rx printed and signed by Dr. Clent RidgesFry.

## 2016-09-28 NOTE — Telephone Encounter (Signed)
Spoke with pt and she states that she has been having issues with diverticulitis and has not been eating very much since last week. She has also been having issues with sciatica since the diverticulitis flare started. Pt is having trouble walking/moving around and has also started using her walker for mobility. She does not have any exercises to do and states that her neurosurgeon has advised against surgery given her age. Appt made with Dr Clent RidgesFry for Monday, as pt has no transportation available sooner. Reviewed heat and ice application protocol with pt. Recommended moist heat. She will try these. Nothing further needed at this time.

## 2016-09-28 NOTE — Telephone Encounter (Signed)
ATC pt at home number, n/a. LMTCB  [facility] that called [facility] is not listed in demographics or DPR.

## 2016-10-02 ENCOUNTER — Ambulatory Visit: Payer: Medicare Other | Admitting: Family Medicine

## 2016-10-09 ENCOUNTER — Other Ambulatory Visit: Payer: Self-pay | Admitting: Family Medicine

## 2016-10-10 ENCOUNTER — Ambulatory Visit (INDEPENDENT_AMBULATORY_CARE_PROVIDER_SITE_OTHER): Payer: Medicare Other | Admitting: Family Medicine

## 2016-10-10 ENCOUNTER — Encounter: Payer: Self-pay | Admitting: Family Medicine

## 2016-10-10 VITALS — BP 110/74 | HR 63 | Temp 98.3°F | Wt 127.8 lb

## 2016-10-10 DIAGNOSIS — J209 Acute bronchitis, unspecified: Secondary | ICD-10-CM | POA: Diagnosis not present

## 2016-10-10 MED ORDER — AZITHROMYCIN 250 MG PO TABS: ORAL_TABLET | ORAL | 0 refills | Status: DC

## 2016-10-10 MED ORDER — HYDROCODONE-HOMATROPINE 5-1.5 MG/5ML PO SYRP: 5.0000 mL | ORAL_SOLUTION | ORAL | 0 refills | Status: DC | PRN

## 2016-10-10 NOTE — Progress Notes (Signed)
   Subjective:    Patient ID: Ferne ReusBetty J Hamada, female    DOB: September 12, 1941, 75 y.o.   MRN: [id number]  HPI Here for one week of chest tightness and a dry cough. No fever.    Review of Systems  Constitutional: Negative.   HENT: Negative.   Eyes: Negative.   Respiratory: Positive for cough, chest tightness and wheezing. Negative for shortness of breath.   Cardiovascular: Negative.        Objective:   Physical Exam  Constitutional: She is oriented to person, place, and time. She appears well-developed and well-nourished.  HENT:  Right Ear: External ear normal.  Left Ear: External ear normal.  Nose: Nose normal.  Mouth/Throat: Oropharynx is clear and moist.  Eyes: Conjunctivae are normal.  Neck: No thyromegaly present.  Pulmonary/Chest: Effort normal. No respiratory distress. She has no wheezes. She has no rales.  Scattered rhonchi   Lymphadenopathy:    She has no cervical adenopathy.  Neurological: She is alert and oriented to person, place, and time.          Assessment & Plan:  Bronchitis, treat with a Zpack.  Nelwyn SalisburyFRY,STEPHEN A, MD

## 2016-10-10 NOTE — Progress Notes (Signed)
Pre visit review using our clinic review tool, if applicable. No additional management support is needed unless otherwise documented below in the visit note. 

## 2016-10-14 ENCOUNTER — Other Ambulatory Visit: Payer: Self-pay | Admitting: Family Medicine

## 2016-10-17 NOTE — Telephone Encounter (Signed)
Per Dr. Fry okay to refill 

## 2016-10-25 ENCOUNTER — Telehealth: Payer: Self-pay | Admitting: Family Medicine

## 2016-10-25 MED ORDER — OXYCODONE HCL 20 MG PO TABS: 1.0000 | ORAL_TABLET | ORAL | 0 refills | Status: DC | PRN

## 2016-10-25 NOTE — Telephone Encounter (Signed)
done

## 2016-10-25 NOTE — Telephone Encounter (Signed)
Pt request refill  °Oxycodone HCl 20 MG TABS °

## 2016-10-25 NOTE — Telephone Encounter (Signed)
lmom for pt daughter that rx is ready for pick up

## 2016-10-27 ENCOUNTER — Encounter: Payer: Self-pay | Admitting: Family Medicine

## 2016-10-27 ENCOUNTER — Ambulatory Visit (INDEPENDENT_AMBULATORY_CARE_PROVIDER_SITE_OTHER): Payer: Medicare Other | Admitting: Family Medicine

## 2016-10-27 VITALS — BP 102/59 | HR 65 | Temp 97.9°F | Ht 63.0 in | Wt 130.0 lb

## 2016-10-27 DIAGNOSIS — N3000 Acute cystitis without hematuria: Secondary | ICD-10-CM | POA: Diagnosis not present

## 2016-10-27 DIAGNOSIS — Z0000 Encounter for general adult medical examination without abnormal findings: Secondary | ICD-10-CM

## 2016-10-27 LAB — POC URINALSYSI DIPSTICK (AUTOMATED)
Bilirubin, UA: NEGATIVE
Glucose, UA: NEGATIVE
Ketones, UA: NEGATIVE
Spec Grav, UA: >=1.03
UROBILINOGEN UA: 0.2
pH, UA: 6

## 2016-10-27 MED ORDER — CIPROFLOXACIN HCL 500 MG PO TABS: 500.0000 mg | ORAL_TABLET | Freq: Two times a day (BID) | ORAL | 0 refills | Status: DC

## 2016-10-27 NOTE — Progress Notes (Signed)
   Subjective:    Patient ID: Ferne ReusBetty J Discher, female    DOB: 02/20/1941, 75 y.o.   MRN: [id number]  HPI Here for one week of urinary frequency and burning. She was treated with Cipro in October for a Klebsiella UTI, and she was symptom free after that. Drinking water and cranberry juice.    Review of Systems  Constitutional: Negative.   Gastrointestinal: Negative.   Genitourinary: Positive for dysuria, frequency and urgency. Negative for flank pain, hematuria and pelvic pain.       Objective:   Physical Exam  Constitutional: She appears well-developed and well-nourished.  Cardiovascular: Normal rate, regular rhythm, normal heart sounds and intact distal pulses.   Pulmonary/Chest: Effort normal and breath sounds normal.  Abdominal: Soft. Bowel sounds are normal. She exhibits no distension and no mass. There is no tenderness. There is no rebound and no guarding.          Assessment & Plan:  UTI, treat with Cipro. Culture the sample.  Nelwyn SalisburyFRY,Mavis Gravelle A, MD

## 2016-10-27 NOTE — Progress Notes (Signed)
Pre visit review using our clinic review tool, if applicable. No additional management support is needed unless otherwise documented below in the visit note. 

## 2016-10-27 NOTE — Addendum Note (Signed)
Addended by: Aniceto BossNIMMONS, Jatoya Armbrister A on: 10/27/2016 03:20 PM   Modules accepted: Orders

## 2016-10-30 LAB — URINE CULTURE

## 2016-10-31 ENCOUNTER — Other Ambulatory Visit: Payer: Self-pay | Admitting: Family Medicine

## 2016-10-31 NOTE — Telephone Encounter (Signed)
Denied.  Message sent to the pharmacy informing them that the pt will need to call and speak to nursing.

## 2016-11-01 ENCOUNTER — Telehealth: Payer: Self-pay | Admitting: Family Medicine

## 2016-11-01 NOTE — Telephone Encounter (Signed)
I spoke with pt and gave results from urine culture.

## 2016-11-01 NOTE — Telephone Encounter (Signed)
Angelica ElmSylvia pt returned the call not sure if it was about her labs or what it was about.

## 2016-11-07 ENCOUNTER — Other Ambulatory Visit: Payer: Self-pay | Admitting: Family Medicine

## 2016-11-08 NOTE — Telephone Encounter (Signed)
Call in #[phone] with 5 rf 

## 2016-11-28 ENCOUNTER — Encounter: Payer: Self-pay | Admitting: Family Medicine

## 2016-12-07 ENCOUNTER — Ambulatory Visit: Payer: Medicare Other | Admitting: Family Medicine

## 2016-12-11 ENCOUNTER — Ambulatory Visit (INDEPENDENT_AMBULATORY_CARE_PROVIDER_SITE_OTHER): Payer: Medicare Other | Admitting: Family Medicine

## 2016-12-11 ENCOUNTER — Encounter: Payer: Self-pay | Admitting: Family Medicine

## 2016-12-11 VITALS — BP 114/72 | HR 59 | Temp 97.7°F | Ht 63.0 in | Wt 130.0 lb

## 2016-12-11 DIAGNOSIS — N390 Urinary tract infection, site not specified: Secondary | ICD-10-CM | POA: Diagnosis not present

## 2016-12-11 MED ORDER — SULFAMETHOXAZOLE-TRIMETHOPRIM 800-160 MG PO TABS: 1.0000 | ORAL_TABLET | Freq: Two times a day (BID) | ORAL | 0 refills | Status: DC

## 2016-12-11 NOTE — Progress Notes (Signed)
   Subjective:    Patient ID: Angelica [name], female    DOB: 1941/05/22, 76 y.o.   MRN: [id number]  HPI Here for recurrent UTI symptoms. She was here on 10-27-16 for urinary frequency and burning, and a culture grew Klebsiella. She was treated with 7 days of Cipro and she felt better. However a week after this was finished the burning returned. She feels well in general. No fever. She has been wearing a pessary for the past 2 years which was placed by Dr. Marcelle OverlieHolland, and she asks of this could be causing the UTIs. She is not able to provide us with a urine sample today.   Review of Systems  Constitutional: Negative.   Gastrointestinal: Negative.   Genitourinary: Positive for dysuria, frequency and urgency. Negative for flank pain, hematuria and pelvic pain.       Objective:   Physical Exam  Constitutional: She appears well-developed and well-nourished.  Cardiovascular: Normal rate, regular rhythm, normal heart sounds and intact distal pulses.   Pulmonary/Chest: Effort normal and breath sounds normal.  Abdominal: Soft. Bowel sounds are normal. She exhibits no distension and no mass. There is no tenderness. There is no rebound and no guarding.          Assessment & Plan:  Recurrent UTIs. Treat with Bactrim DS for 14 days. I think the pessary could in fact play a role in these infections, so she will ask Dr. Marcelle OverlieHolland about it at her upcoming visit with him.  Gershon CraneStephen Adrin Julian, MD

## 2016-12-11 NOTE — Progress Notes (Signed)
Pre visit review using our clinic review tool, if applicable. No additional management support is needed unless otherwise documented below in the visit note. 

## 2016-12-18 ENCOUNTER — Other Ambulatory Visit: Payer: Self-pay | Admitting: Family Medicine

## 2016-12-18 NOTE — Telephone Encounter (Signed)
Can we refill this? 

## 2017-01-01 ENCOUNTER — Telehealth: Payer: Self-pay | Admitting: Family Medicine

## 2017-01-17 ENCOUNTER — Encounter: Payer: Self-pay | Admitting: Family Medicine

## 2017-01-17 ENCOUNTER — Telehealth: Payer: Self-pay | Admitting: Family Medicine

## 2017-01-17 ENCOUNTER — Ambulatory Visit (INDEPENDENT_AMBULATORY_CARE_PROVIDER_SITE_OTHER): Payer: Medicare Other | Admitting: Family Medicine

## 2017-01-17 VITALS — BP 115/76 | HR 71 | Temp 98.3°F | Ht 63.0 in | Wt 132.0 lb

## 2017-01-17 DIAGNOSIS — R413 Other amnesia: Secondary | ICD-10-CM | POA: Diagnosis not present

## 2017-01-17 NOTE — Telephone Encounter (Signed)
Angelica [name] daughter called to advise Dr Clent RidgesFry before the visit pt's memory issues seem to be getting worse.  Pt has been doing things out of character.  Daughter would like you to do whatever is needed to get to the root of what is going on.  She is concerned pt may not need to be left alone anymore.  Angelica [name] wanted to let the dr know before they get here, because pt will not admit anything is wrong, and is in denial. Pt may not mention the memory issues she has been experiencing.   Pt has appt today at 2:30 pm.

## 2017-01-17 NOTE — Progress Notes (Signed)
Pre visit review using our clinic review tool, if applicable. No additional management support is needed unless otherwise documented below in the visit note. 

## 2017-01-17 NOTE — Progress Notes (Signed)
   Subjective:    Patient ID: Angelica [name], female    DOB: [birth year], 76 y.o.   MRN: [id number]  HPI Here with her daughter to discuss recent memory trouble and confusion. Over the past few weeks the family has noticed slowly progressing confusion, trouble putting her thoughts into words, forgetfulness, and even some delusions. Several times she has believed family members were present in her home when they were not. No other symptoms to indicate an infection anywhere. No recent medication changes. Of note she has multiple family members who have had dementia including her mother and several sisters.    Review of Systems  Constitutional: Negative.   Respiratory: Negative.   Cardiovascular: Negative.   Gastrointestinal: Negative.   Genitourinary: Negative.   Neurological: Negative.   Psychiatric/Behavioral: Positive for confusion and decreased concentration. Negative for agitation, behavioral problems, dysphoric mood, hallucinations and sleep disturbance. The patient is not nervous/anxious.        Objective:   Physical Exam  Constitutional: She is oriented to person, place, and time. She appears well-developed and well-nourished.  Cardiovascular: Normal rate, regular rhythm, normal heart sounds and intact distal pulses.   Pulmonary/Chest: Effort normal and breath sounds normal.  Neurological: She is alert and oriented to person, place, and time. No cranial nerve deficit. Coordination normal.  She obviously has trouble putting her thoughts into words and gets confused when telling me about her past week   Psychiatric: She has a normal mood and affect. Her behavior is normal.          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 We will refer her to Neurology to evaluate further. Gershon CraneStephen Terrye Dombrosky, MD

## 2017-01-17 NOTE — Patient Instructions (Signed)
WE NOW OFFER   [facility] FAST TRACK!!!  SAME DAY Appointments for ACUTE CARE  Such as: Sprains, Injuries, cuts, abrasions, rashes, muscle pain, joint pain, back pain Colds, flu, sore throats, headache, allergies, cough, fever  Ear pain, sinus and eye infections Abdominal pain, nausea, vomiting, diarrhea, upset stomach Animal/insect bites  3 Easy Ways to Schedule: Walk-In Scheduling Call in scheduling Mychart Sign-up: [email or web address]         

## 2017-01-17 NOTE — Telephone Encounter (Signed)
Noted  

## 2017-01-23 ENCOUNTER — Encounter: Payer: Self-pay | Admitting: Neurology

## 2017-02-05 ENCOUNTER — Telehealth: Payer: Self-pay | Admitting: Family Medicine

## 2017-02-05 NOTE — Telephone Encounter (Signed)
Pt request refill  Oxycodone HCl 20 MG TABS  jolie states pt did not get her last rx filled until 01/11/17. She hopes she can still pick up the prescription when she picks up her rx, (due 2/28) so  she doesn't have to bring pt out but one time, since they cannot now leave her alone.

## 2017-02-06 MED ORDER — OXYCODONE HCL 20 MG PO TABS: 1.0000 | ORAL_TABLET | ORAL | 0 refills | Status: DC | PRN

## 2017-02-06 NOTE — Telephone Encounter (Signed)
Script is ready for pick up here at front office and I left a voice message for pt.  

## 2017-02-06 NOTE — Telephone Encounter (Signed)
done

## 2017-02-06 NOTE — Telephone Encounter (Signed)
Pts daughter is calling to check the status of the Rx

## 2017-02-11 ENCOUNTER — Other Ambulatory Visit: Payer: Self-pay | Admitting: Family Medicine

## 2017-02-12 NOTE — Telephone Encounter (Signed)
Can we refill this? 

## 2017-02-27 ENCOUNTER — Other Ambulatory Visit: Payer: Self-pay | Admitting: Family Medicine

## 2017-02-28 DIAGNOSIS — R300 Dysuria: Secondary | ICD-10-CM | POA: Diagnosis not present

## 2017-02-28 DIAGNOSIS — N390 Urinary tract infection, site not specified: Secondary | ICD-10-CM | POA: Diagnosis not present

## 2017-02-28 DIAGNOSIS — N819 Female genital prolapse, unspecified: Secondary | ICD-10-CM | POA: Diagnosis not present

## 2017-03-05 ENCOUNTER — Telehealth: Payer: Self-pay | Admitting: Family Medicine

## 2017-03-05 MED ORDER — OXYCODONE HCL 20 MG PO TABS: 1.0000 | ORAL_TABLET | ORAL | 0 refills | Status: DC | PRN

## 2017-03-05 NOTE — Telephone Encounter (Signed)
done

## 2017-03-05 NOTE — Telephone Encounter (Signed)
° °  Pt request refill of the following: ° °Oxycodone HCl 20 MG TABS ° °Phamacy: °

## 2017-03-06 NOTE — Telephone Encounter (Signed)
Script is ready for pick up here at front office and I left a voice message with this information.  

## 2017-03-12 ENCOUNTER — Encounter: Payer: Self-pay | Admitting: Neurology

## 2017-03-12 ENCOUNTER — Other Ambulatory Visit: Payer: Medicare Other

## 2017-03-12 ENCOUNTER — Ambulatory Visit (INDEPENDENT_AMBULATORY_CARE_PROVIDER_SITE_OTHER): Payer: Medicare Other | Admitting: Neurology

## 2017-03-12 VITALS — BP 122/70 | HR 67 | Temp 97.6°F | Ht 62.0 in | Wt 136.0 lb

## 2017-03-12 DIAGNOSIS — R413 Other amnesia: Secondary | ICD-10-CM

## 2017-03-12 DIAGNOSIS — F0390 Unspecified dementia without behavioral disturbance: Secondary | ICD-10-CM | POA: Diagnosis not present

## 2017-03-12 DIAGNOSIS — F03A0 Unspecified dementia, mild, without behavioral disturbance, psychotic disturbance, mood disturbance, and anxiety: Secondary | ICD-10-CM

## 2017-03-12 DIAGNOSIS — G3184 Mild cognitive impairment, so stated: Secondary | ICD-10-CM

## 2017-03-12 NOTE — Progress Notes (Addendum)
NEUROLOGY CONSULTATION NOTE  MINHA FULCO MRN: [id number] DOB: 1941-07-01  Referring provider: Dr. Gershon Crane Primary care provider: Dr. Gershon Crane  Reason for consult:  Memory loss  Dear Dr Clent Ridges:  Thank you for your kind referral of Ferne Reus for consultation of the above symptoms. Although her history is well known to you, please allow me to reiterate it for the purpose of our medical record. The patient was accompanied to the clinic by her daughter and son who also provide collateral information. Records and images were personally reviewed where available.  HISTORY OF PRESENT ILLNESS: This is a 76 year old right-handed woman with a history of hypertension, hyperlipidemia, COPD, chronic back pain, presenting for evaluation of worsening memory. Her daughter had a notebook detailing events since February, which I personally reviewed. She did not want to tell this in front of her mother because she felt it would embarrass her. The patient agrees that her memory is not as good as it was, she forgets conversations and names, and started noticing this around 6 months ago. She had previously been living by herself until February when she got very upset looking for her granddaughter and could not be convinced that she was okay. Since then, confusion has been worsening, her daughter has been staying with her. She is taking oxycodone on a daily basis and keeps it in a safe that only she knows the combination, she and her son-in-law have the key. Her daughter reports it is because of the daughter's history of medication abuse, so that nobody can accuse her of getting into the safe. The daughter reports that she would write down when she took her oxycodone, but found that she would sometimes write down she took a pill but did not take it. The daughter may find a pill on the dresser, kitchen table, or somewhere else. When asked about it, she would say she took the medication but it was not working  yet. One time she was crying because her pills were not in the safe, her daughter had to give her her own pill. She has let her daughter fill her pillbox with her other medications after she saw that slots were not filled out. She would get confused calling her daughter a different name. She reports being in charge of bills, her daughter had written down that she asked her daughter why she had no money, and it turned out she had lent money to several family members. She can become argumentative. She stopped driving in [id number] after her husband passed away. She misplaces things, her daughter reports that 2.5 months ago, her daughter bought hamburger and chicken meat. She cleaned the chicken but apparently put the meat in the cabinet and it started to smell.   During her visit, she is drowsy and nods off several times. When asked, they report this is unusual, she did not sleep well the past 2 nights. Her daughter however had written down that she is a night owl and wants to stay up at night. She would want a snack, but 2 hours later still be eating the snack and dozing off, falling asleep with food in her mouth. She would get a little cranky when her daughter tells her to go to bed. She denies any headaches, dizziness, diplopia, dysarthria/dysphagia, focal numbness/tingling/weakness except for numbness on the left lateral thigh. She has chronic pain and takes 3-4 Oxycodone daily instead of every 4 hours. They report counting out her pills. She soiled herself  one time. She denies any significant head injuries or alcohol intake. Her mother and nephew were diagnosed with Alzheimer's disease. Her son also came to the visit but states he lives an hour away and has noticed she forgets names and repeats conversations. Her daughter had written down on the journal that there are issues with the son.   Laboratory Data: Lab Results  Component Value Date   WBC 7.3 01/19/2014   HGB 13.5 01/19/2014   HCT 40.0 01/19/2014    MCV 90.7 01/19/2014   PLT 230.0 01/19/2014     Chemistry      Component Value Date/Time   NA 142 01/19/2014 1134   K 4.1 01/19/2014 1134   CL 107 01/19/2014 1134   CO2 27 01/19/2014 1134   BUN 10 01/19/2014 1134   CREATININE 0.7 01/19/2014 1134      Component Value Date/Time   CALCIUM 9.4 01/19/2014 1134   ALKPHOS 85 01/19/2014 1134   AST 22 01/19/2014 1134   ALT 16 01/19/2014 1134   BILITOT 0.3 01/19/2014 1134      PAST MEDICAL HISTORY: Past Medical History:  Diagnosis Date  . Allergic rhinitis   . Blood transfusion   . Chronic back pain    sees Dr. Odette Fraction at [facility]   . COPD (chronic obstructive pulmonary disease) (HCC)   . Diverticulosis    hx  . Gallstones   . GERD (gastroesophageal reflux disease)   . Hiatal hernia   . HLD (hyperlipidemia)   . HTN (hypertension)   . Osteoarthritis   . PUD (peptic ulcer disease)    duodenal 1980s    PAST SURGICAL HISTORY: Past Surgical History:  Procedure Laterality Date  . CERVICAL LAMINECTOMY  8/07   Dr. Venetia Maxon  . L shoulder surgery  2007   Dr. Teressa Senter  . LUMBAR DISC SURGERY  1986    MEDICATIONS: Current Outpatient Prescriptions on File Prior to Visit  Medication Sig Dispense Refill  . albuterol (PROVENTIL HFA) 108 (90 Base) MCG/ACT inhaler Inhale 2 puffs into the lungs every 4 (four) hours as needed for wheezing or shortness of breath. 1 Inhaler 11  . alprazolam (XANAX) 2 MG tablet TAKE 1 TABLET BY MOUTH 3 TIMES A DAY AS NEEDED 90 tablet 5  . aspirin 81 MG EC tablet Take 81 mg by mouth daily.      Marland Kitchen atenolol (TENORMIN) 25 MG tablet TAKE 1 TABLET BY MOUTH EVERY DAY 30 tablet 5  . baclofen (LIORESAL) 10 MG tablet TAKE 1 TABLET BY MOUTH 3 TIMES A DAY AS NEEDED 90 tablet 3  . beclomethasone (QVAR) 40 MCG/ACT inhaler Inhale 2 puffs into the lungs 2 (two) times daily. 1 Inhaler 11  . cetirizine-pseudoephedrine (ZYRTEC-D) 5-120 MG per tablet Take 1 tablet by mouth 2 (two) times daily.    . diclofenac sodium  (VOLTAREN) 1 % GEL Apply 1 application topically every 6 (six) hours as needed (knee pain ). 100 g 11  . gabapentin (NEURONTIN) 100 MG capsule TAKE 1 CAPSULE (100 MG TOTAL) BY MOUTH 3 (THREE) TIMES DAILY. (Patient taking differently: TAKE 1 CAPSULE (100 MG TOTAL) BY MOUTH 2 TIMES DAILY.) 90 capsule 3  . HYDROcodone-homatropine (HYDROMET) 5-1.5 MG/5ML syrup Take 5 mLs by mouth every 4 (four) hours as needed for cough. 240 mL 0  . Multiple Vitamin (MULTIVITAMIN) tablet Take 1 tablet by mouth daily.    . Oxycodone HCl 20 MG TABS Take 1 tablet (20 mg total) by mouth every 4 (four) hours as  needed (pain). 180 tablet 0  . polyethylene glycol powder (GLYCOLAX/MIRALAX) powder Take 17 g by mouth daily.    . promethazine (PHENERGAN) 25 MG tablet Take 1 tablet (25 mg total) by mouth every 4 (four) hours as needed for nausea. 60 tablet 5  . sertraline (ZOLOFT) 100 MG tablet TAKE 1 TABLET (100 MG TOTAL) BY MOUTH DAILY. 30 tablet 11  . Vitamin D, Ergocalciferol, (DRISDOL) 50000 units CAPS capsule TAKE 1 CAPSULE BY MOUTH EVERY 7 DAYS. 4 capsule 11   No current facility-administered medications on file prior to visit.     ALLERGIES: Allergies  Allergen Reactions  . Codeine Itching  . Penicillins Hives    FAMILY HISTORY: Family History  Problem Relation Age of Onset  . Colon cancer Neg Hx     SOCIAL HISTORY: Social History   Social History  . Marital status: Married    Spouse name: N/A  . Number of children: N/A  . Years of education: N/A   Occupational History  . Not on file.   Social History Main Topics  . Smoking status: Current Every Day Smoker    Packs/day: 1.00    Types: Cigarettes  . Smokeless tobacco: Never Used     Comment: less than a pack  . Alcohol use No  . Drug use: No  . Sexual activity: No   Other Topics Concern  . Not on file   Social History Narrative   Married, 3 children (1B / 2G)   Retired Scientist, physiological   Daily caffeine: 2-3 drinks daily     REVIEW OF  SYSTEMS: Constitutional: No fevers, chills, or sweats, no generalized fatigue, change in appetite Eyes: No visual changes, double vision, eye pain Ear, nose and throat: No hearing loss, ear pain, nasal congestion, sore throat Cardiovascular: No chest pain, palpitations Respiratory:  No shortness of breath at rest or with exertion, wheezes GastrointestinaI: No nausea, vomiting, diarrhea, abdominal pain, fecal incontinence Genitourinary: No dysuria, urinary retention or frequency Musculoskeletal:  +neck pain, back pain Integumentary: No rash, pruritus, skin lesions Neurological: as above Psychiatric: No depression, insomnia, anxiety Endocrine: No palpitations, fatigue, diaphoresis, mood swings, change in appetite, change in weight, increased thirst Hematologic/Lymphatic:  No anemia, purpura, petechiae. Allergic/Immunologic: no itchy/runny eyes, nasal congestion, recent allergic reactions, rashes  PHYSICAL EXAM: Vitals:   03/12/17 1033  BP: 122/70  Pulse: 67  Temp: 97.6 F (36.4 C)   General: No acute distress, drowsy, nodding off, arousable and follows commands and answers questions  Head:  Normocephalic/atraumatic Eyes: Fundoscopic exam shows bilateral sharp discs, no vessel changes, exudates, or hemorrhages Neck: supple, no paraspinal tenderness, full range of motion Back: No paraspinal tenderness Heart: regular rate and rhythm Lungs: Clear to auscultation bilaterally. Vascular: No carotid bruits. Skin/Extremities: No rash, no edema Neurological Exam: Mental status: alert and oriented to person, place, and time, no dysarthria or aphasia, Fund of knowledge is appropriate.  Remote memory intact.  Attention and concentration are normal.    Able to name objects and repeat phrases. CDT 4/5 MMSE - Mini Mental State Exam 03/12/2017  Orientation to time 4  Orientation to Place 5  Registration 3  Attention/ Calculation 5  Recall 0  Language- name 2 objects 2  Language- repeat 1    Language- follow 3 step command 3  Language- read & follow direction 1  Write a sentence 1  Copy design 0  Total score 25   Cranial nerves: CN I: not tested CN II: pupils equal, round and reactive to  light, visual fields intact, fundi unremarkable. CN III, IV, VI:  full range of motion, no nystagmus, no ptosis CN V: facial sensation intact CN VII: upper and lower face symmetric CN VIII: hearing intact to finger rub CN IX, X: gag intact, uvula midline CN XI: sternocleidomastoid and trapezius muscles intact CN XII: tongue midline Bulk & Tone: normal, no fasciculations. Motor: 5/5 throughout with no pronator drift. Sensation: intact to light touch, cold, pin, vibration and joint position sense.  No extinction to double simultaneous stimulation.  Romberg test negative Deep Tendon Reflexes: +2 throughout, no ankle clonus Plantar responses: downgoing bilaterally Cerebellar: no incoordination on finger to nose, heel to shin. No dysdiadochokinesia Gait: slow and cautious, no ataxia Tremor: none  IMPRESSION: This is a 76 year old right-handed woman with a history of hypertension, hyperlipidemia, COPD, chronic back pain, presenting for evaluation of worsening memory. Her daughter detailed concerns in a notebook which included concerns for memory, medication and finance management, and personality changes. Her daughter did not want to disclose these in front of her mother today. 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 They state that oxycodone pills are counted, and she does not take more than prescribed, however with memory concerns and her being in charge of her own dosing, it is unclear if medication is affecting cognition. MRI brain without contrast will be ordered to assess for underlying structural abnormality. Check TSH, B12, RPR. We discussed home safety, continued monitoring of medications, she may need a visiting nurse and 24/7 supervision with continued  confusion. We discussed cholinesterase inhibitors such as Aricept, side effects and expectations from the medication, and have agreed to hold off for now and follow-up in 6 months, she and family know to call for any changes.   Thank you for allowing me to participate in the care of this patient. Please do not hesitate to call for any questions or concerns.   Patrcia Dolly, M.D.  CC: Dr. Clent Ridges

## 2017-03-12 NOTE — Patient Instructions (Signed)
1. Schedule MRI brain without contrast 2. Bloodwork for TSH, B12, RPR 3. Continue monitoring home safety, if there is continued confusion, she should always have someone with her at home 4. Follow-up in 6 months, call for any changes

## 2017-03-13 ENCOUNTER — Other Ambulatory Visit: Payer: Self-pay | Admitting: Family Medicine

## 2017-03-13 LAB — RPR

## 2017-03-13 LAB — VITAMIN B12: Vitamin B-12: 287 pg/mL (ref 200–1100)

## 2017-03-13 LAB — TSH: TSH: 0.8 m[IU]/L

## 2017-03-13 NOTE — Telephone Encounter (Signed)
Can we refill this? Looks like old script. 

## 2017-03-13 NOTE — Telephone Encounter (Signed)
Call in #[phone] with 5 rf 

## 2017-03-16 ENCOUNTER — Telehealth: Payer: Self-pay | Admitting: Neurology

## 2017-03-16 NOTE — Telephone Encounter (Signed)
LMOM asking pt to call our office for recent lab results.

## 2017-03-16 NOTE — Telephone Encounter (Signed)
-----   Message from Karen M Aquino, MD sent at 03/16/2017 10:48 AM EDT ----- °Pls let her know bloodwork is normal, thanks °

## 2017-03-18 ENCOUNTER — Encounter: Payer: Self-pay | Admitting: Neurology

## 2017-03-19 NOTE — Telephone Encounter (Signed)
LMOM asking pt to call office for resent lab results

## 2017-03-19 NOTE — Telephone Encounter (Signed)
-----   Message from Van ClinesKaren M Aquino, MD sent at 03/16/2017 10:48 AM EDT ----- Pls let her know bloodwork is normal, thanks

## 2017-03-20 ENCOUNTER — Telehealth: Payer: Self-pay | Admitting: Neurology

## 2017-03-20 NOTE — Telephone Encounter (Signed)
Patient daughter needs to know when and where the MRI is patient told her it was here and it was Sep 10 2017

## 2017-03-21 NOTE — Telephone Encounter (Signed)
LMOM for daughter explaining that [facility]  Her mother was confused about the date and time, as her next appointment with Dr. Karel JarvisAquino is September 10, 2017

## 2017-03-25 ENCOUNTER — Other Ambulatory Visit: Payer: Self-pay | Admitting: Family Medicine

## 2017-03-27 NOTE — Telephone Encounter (Signed)
Called to see if pt wanted to scheduled awv - left message.

## 2017-04-03 ENCOUNTER — Telehealth: Payer: Self-pay | Admitting: Family Medicine

## 2017-04-03 NOTE — Telephone Encounter (Signed)
Pt needs new rx hydrocodone °

## 2017-04-05 MED ORDER — OXYCODONE HCL 20 MG PO TABS: 1.0000 | ORAL_TABLET | ORAL | 0 refills | Status: DC | PRN

## 2017-04-05 NOTE — Telephone Encounter (Signed)
Script is ready for pick up here at front office and I left a voice message with this information.  

## 2017-04-05 NOTE — Telephone Encounter (Signed)
done

## 2017-04-18 ENCOUNTER — Ambulatory Visit
Admission: RE | Admit: 2017-04-18 | Discharge: 2017-04-18 | Disposition: A | Discharge status: Home / Self Care | Payer: Medicare Other | Source: Ambulatory Visit | Attending: Neurology | Admitting: Neurology

## 2017-04-18 DIAGNOSIS — G3184 Mild cognitive impairment, so stated: Secondary | ICD-10-CM

## 2017-04-18 DIAGNOSIS — R413 Other amnesia: Secondary | ICD-10-CM

## 2017-04-19 ENCOUNTER — Telehealth: Payer: Self-pay

## 2017-04-19 NOTE — Telephone Encounter (Signed)
LMOM asking pt to contact our office to relay message below.

## 2017-04-19 NOTE — Telephone Encounter (Signed)
-----   Message from Van ClinesKaren M Aquino, MD sent at 04/19/2017 10:32 AM EDT ----- Pls let her know MRI brain did not show any evidence of tumor, stroke, or bleed. It showed age-related changes, and changes seen in patients with blood pressure and cholesterol issues. Thanks

## 2017-04-20 ENCOUNTER — Telehealth: Payer: Self-pay | Admitting: Neurology

## 2017-04-20 NOTE — Telephone Encounter (Signed)
Caller: Angelica [name]  Urgent? No  Reason for the call: Returning your call. Thanks

## 2017-04-23 ENCOUNTER — Telehealth: Payer: Self-pay

## 2017-04-23 NOTE — Telephone Encounter (Signed)
LMOM asking pt to return my call for MRI results.

## 2017-04-23 NOTE — Telephone Encounter (Signed)
Pt returned my call.  Relayed resent MRI results.  Pt appreciative.

## 2017-04-26 ENCOUNTER — Other Ambulatory Visit: Payer: Self-pay | Admitting: Family Medicine

## 2017-04-26 NOTE — Telephone Encounter (Signed)
Call in #[phone] with 5 rf 

## 2017-05-01 ENCOUNTER — Ambulatory Visit (INDEPENDENT_AMBULATORY_CARE_PROVIDER_SITE_OTHER): Payer: Medicare Other | Admitting: Family Medicine

## 2017-05-01 ENCOUNTER — Encounter: Payer: Self-pay | Admitting: Family Medicine

## 2017-05-01 VITALS — BP 119/74 | HR 62 | Temp 98.2°F | Ht 62.0 in | Wt 133.0 lb

## 2017-05-01 DIAGNOSIS — R309 Painful micturition, unspecified: Secondary | ICD-10-CM | POA: Diagnosis not present

## 2017-05-01 DIAGNOSIS — N3000 Acute cystitis without hematuria: Secondary | ICD-10-CM | POA: Diagnosis not present

## 2017-05-01 LAB — POC URINALSYSI DIPSTICK (AUTOMATED)
BILIRUBIN UA: NEGATIVE
GLUCOSE UA: NEGATIVE
KETONES UA: NEGATIVE
Nitrite, UA: NEGATIVE
Protein, UA: NEGATIVE
Urobilinogen, UA: 0.2 E.U./dL
pH, UA: 6 (ref 5.0–8.0)

## 2017-05-01 MED ORDER — SULFAMETHOXAZOLE-TRIMETHOPRIM 800-160 MG PO TABS: 1.0000 | ORAL_TABLET | Freq: Two times a day (BID) | ORAL | 0 refills | Status: DC

## 2017-05-01 MED ORDER — OXYCODONE HCL 20 MG PO TABS: 1.0000 | ORAL_TABLET | ORAL | 0 refills | Status: DC | PRN

## 2017-05-01 NOTE — Patient Instructions (Signed)
WE NOW OFFER   [facility] FAST TRACK!!!  SAME DAY Appointments for ACUTE CARE  Such as: Sprains, Injuries, cuts, abrasions, rashes, muscle pain, joint pain, back pain Colds, flu, sore throats, headache, allergies, cough, fever  Ear pain, sinus and eye infections Abdominal pain, nausea, vomiting, diarrhea, upset stomach Animal/insect bites  3 Easy Ways to Schedule: Walk-In Scheduling Call in scheduling Mychart Sign-up: [email or web address]         

## 2017-05-01 NOTE — Progress Notes (Signed)
   Subjective:    Patient ID: Angelica [name], female    DOB: [birth year], 76 y.o.   MRN: [id number]  HPI Here with her daughter for 2 days of urinary urgency and burning. No fever. Drinking plenty of water.    Review of Systems  Constitutional: Negative.   Respiratory: Negative.   Cardiovascular: Negative.   Gastrointestinal: Negative.   Genitourinary: Positive for dysuria, frequency and urgency. Negative for flank pain.       Objective:   Physical Exam  Constitutional: She appears well-developed and well-nourished. No distress.  Cardiovascular: Normal rate, regular rhythm, normal heart sounds and intact distal pulses.   Pulmonary/Chest: Effort normal and breath sounds normal.  Abdominal: Soft. Bowel sounds are normal. She exhibits no distension and no mass. There is no tenderness. There is no rebound and no guarding.          Assessment & Plan:  UTI, treat with Bactrim DS. There was insufficient sample to order a culture. Recheck prn.  Gershon CraneStephen Fry, MD

## 2017-05-25 ENCOUNTER — Telehealth: Payer: Self-pay | Admitting: Family Medicine

## 2017-05-25 MED ORDER — SULFAMETHOXAZOLE-TRIMETHOPRIM 800-160 MG PO TABS: 1.0000 | ORAL_TABLET | Freq: Two times a day (BID) | ORAL | 0 refills | Status: DC

## 2017-05-25 NOTE — Telephone Encounter (Signed)
Pt needs another round of abx sulfamethoxazole- tmp ds tablet #14 send to [facility] [address]. Pt has uti

## 2017-05-25 NOTE — Telephone Encounter (Signed)
Per Dr. Clent RidgesFry order Bactrim DS take 1 po bid for 7 days. I sent script e-scribe to [facility] and spoke with pt.

## 2017-06-04 ENCOUNTER — Telehealth: Payer: Self-pay | Admitting: Family Medicine

## 2017-06-04 MED ORDER — OXYCODONE HCL 20 MG PO TABS: 1.0000 | ORAL_TABLET | ORAL | 0 refills | Status: DC | PRN

## 2017-06-04 NOTE — Telephone Encounter (Signed)
Pt need new Rx for oxycodone.    Pt is aware of 3 business days for refills and someone will call when ready for pick up. °

## 2017-06-04 NOTE — Telephone Encounter (Signed)
done

## 2017-06-04 NOTE — Telephone Encounter (Signed)
Script is ready for pick up here at front office and I left a voice message.  

## 2017-06-06 ENCOUNTER — Other Ambulatory Visit: Payer: Self-pay | Admitting: Family Medicine

## 2017-06-06 NOTE — Telephone Encounter (Signed)
Can we refill this? 

## 2017-06-25 DIAGNOSIS — N819 Female genital prolapse, unspecified: Secondary | ICD-10-CM | POA: Diagnosis not present

## 2017-07-04 ENCOUNTER — Telehealth: Payer: Self-pay | Admitting: Family Medicine

## 2017-07-04 NOTE — Telephone Encounter (Signed)
Pt request refill  °Oxycodone HCl 20 MG TABS °

## 2017-07-05 MED ORDER — OXYCODONE HCL 20 MG PO TABS: 1.0000 | ORAL_TABLET | ORAL | 0 refills | Status: DC | PRN

## 2017-07-05 NOTE — Telephone Encounter (Signed)
Done

## 2017-07-05 NOTE — Telephone Encounter (Signed)
Script is ready for pick up here at front office and I left a message.  

## 2017-07-09 ENCOUNTER — Other Ambulatory Visit: Payer: Self-pay | Admitting: Family Medicine

## 2017-07-09 NOTE — Telephone Encounter (Signed)
Do not see on current medication list? 

## 2017-08-04 ENCOUNTER — Other Ambulatory Visit: Payer: Self-pay | Admitting: Family Medicine

## 2017-08-06 ENCOUNTER — Telehealth: Payer: Self-pay | Admitting: Family Medicine

## 2017-08-06 NOTE — Telephone Encounter (Signed)
Pt need new Rx for Oxycodone 20 MG.  Pt is aware of 3 business days for refills and someone will call when ready for pick up.

## 2017-08-07 MED ORDER — OXYCODONE HCL 20 MG PO TABS: 1.0000 | ORAL_TABLET | ORAL | 0 refills | Status: DC | PRN

## 2017-08-07 NOTE — Telephone Encounter (Signed)
Script is ready for pick up here at front office and left a message.  

## 2017-08-07 NOTE — Telephone Encounter (Signed)
Done

## 2017-09-04 ENCOUNTER — Telehealth: Payer: Self-pay | Admitting: Family Medicine

## 2017-09-04 MED ORDER — OXYCODONE HCL 20 MG PO TABS: 1.0000 | ORAL_TABLET | ORAL | 0 refills | Status: DC | PRN

## 2017-09-04 NOTE — Telephone Encounter (Signed)
° °  Pt request refill of the following: ° °Oxycodone HCl 20 MG TABS ° °Phamacy: °

## 2017-09-04 NOTE — Telephone Encounter (Signed)
Done

## 2017-09-05 ENCOUNTER — Other Ambulatory Visit: Payer: Self-pay | Admitting: Family Medicine

## 2017-09-05 NOTE — Telephone Encounter (Signed)
Script is ready for pick up here at front office and left a voice message, also letter was given.

## 2017-09-10 ENCOUNTER — Telehealth: Payer: Self-pay

## 2017-09-10 ENCOUNTER — Encounter: Payer: Self-pay | Admitting: Neurology

## 2017-09-10 ENCOUNTER — Ambulatory Visit (INDEPENDENT_AMBULATORY_CARE_PROVIDER_SITE_OTHER): Payer: Medicare Other | Admitting: Neurology

## 2017-09-10 VITALS — BP 128/70 | HR 66 | Ht 62.0 in | Wt 123.0 lb

## 2017-09-10 DIAGNOSIS — F03A0 Unspecified dementia, mild, without behavioral disturbance, psychotic disturbance, mood disturbance, and anxiety: Secondary | ICD-10-CM

## 2017-09-10 DIAGNOSIS — F0390 Unspecified dementia without behavioral disturbance: Secondary | ICD-10-CM

## 2017-09-10 MED ORDER — DONEPEZIL HCL 10 MG PO TABS: ORAL_TABLET | ORAL | 11 refills | Status: DC

## 2017-09-10 NOTE — Telephone Encounter (Signed)
Received VM message this morning from pt's daughter.  She states that pt's memory is getting worse.  She does not remember that her son was with her during her LOV with Dr. Karel JarvisAquino.  Does not think any doctors have told her to not live alone.  Getting irritated and angry with family more often now.  States that pt takes her medications irregularly - some days she takes too much, other days she doesn't think pt takes any medications.  She wanted to update us, without pt knowing, as she feels that pt will be angry with her for updating us.  Daughter asks that we put everything in writing after today's appointment so she can show pt what Dr. Karel JarvisAquino says, because she knows that pt will forget.

## 2017-09-10 NOTE — Progress Notes (Signed)
NEUROLOGY FOLLOW UP OFFICE NOTE  Angelica [name] [id number] [birth year]  HISTORY OF PRESENT ILLNESS: I had the pleasure of seeing Angelica [name] in follow-up in the neurology clinic on 09/10/2017.  The patient was last seen 6 months ago for worsening memory and is accompanied by her daughter who supplement the history today.  Records and images were personally reviewed where available.  MMSE in April 2018 was 25/30, however with daughter's report of difficulty managing complex tasks such as medications and finances, symptoms suggestive of mild dementia. I personally reviewed MRI brain without contrast which did not show any acute changes, there was mild diffuse atrophy and mild chronic microvascular disease. When asked about her memory, she states "evidently it's not that great." She does noticed difficulties with names and word-finding. She loses her phone frequently. She reports she has good and bad days. She often cannot remember when she put her book down. She becomes somewhat defensive when her daughter reports difficulties with medication. They have a system at home for her pain medications, her son-in-law keeps them in a safe, then puts it daily in a separate safe that only she and her son-in-law have access to. However it appears that sometimes they hand her the medication then she forgets where she put it or if she took it. She is supposed to write down when she took her medication, but would forget. One time her daughter told her she put her cigarettes on the counter, then she got called asking where they were, they were not on the counter and they could not find them in the house. No one could say what happened to them. Her daughter also wrote an email prior to the visit stating her mother's memory is getting worse. She did not remember her son was present on the last clinic visit. She gets irritated and angry more with family. She takes her medications irregularly, some days she takes too much,  other days she does not think her mother takes any medications. She continues to manage her own bills without any difficulties. She stopped driving 6 years ago. She has tension and sinus headaches and takes Ibuprofen 800mg  once a week. She occasionally gets dizzy. She fell last Spring over a toy. Her daughter reports she shuffles when she walks, stumbles a few times. She has a lot of back pain.  Diagnostic Data: I personally reviewed MRI brain without contrast done 04/18/17 which did not show acute changes. There was diffuse volume loss and mild to moderate chronic microvascular disease.  TSH, B12 normal. RPR negative.  HPI 03/12/2017: This is a 76 yo RH woman with a history of hypertension, hyperlipidemia, COPD, chronic back pain, who presented for evaluation of worsening memory. Her daughter had a notebook detailing events since February, which I personally reviewed. She did not want to tell this in front of her mother because she felt it would embarrass her. The patient agrees that her memory is not as good as it was, she forgets conversations and names, and started noticing this around 6 months ago. She had previously been living by herself until February when she got very upset looking for her granddaughter and could not be convinced that she was okay. Since then, confusion has been worsening, her daughter has been staying with her. She is taking oxycodone on a daily basis and keeps it in a safe that only she knows the combination, she and her son-in-law have the key. Her daughter reports it is because of the daughter's  history of medication abuse, so that nobody can accuse her of getting into the safe. The daughter reports that she would write down when she took her oxycodone, but found that she would sometimes write down she took a pill but did not take it. The daughter may find a pill on the dresser, kitchen table, or somewhere else. When asked about it, she would say she took the medication but it was not  working yet. One time she was crying because her pills were not in the safe, her daughter had to give her her own pill. She has let her daughter fill her pillbox with her other medications after she saw that slots were not filled out. She would get confused calling her daughter a different name. She reports being in charge of bills, her daughter had written down that she asked her daughter why she had no money, and it turned out she had lent money to several family members. She can become argumentative. She stopped driving in [id number] after her husband passed away. She misplaces things, her daughter reports that 2.5 months ago, her daughter bought hamburger and chicken meat. She cleaned the chicken but apparently put the meat in the cabinet and it started to smell.   During her initial visit, she was drowsy and nodded off several times. When asked, they report this is unusual, she did not sleep well the past 2 nights. Her daughter however had written down that she is a night owl and wants to stay up at night. She would want a snack, but 2 hours later still be eating the snack and dozing off, falling asleep with food in her mouth. She would get a little cranky when her daughter tells her to go to bed. She denies any headaches, dizziness, diplopia, dysarthria/dysphagia, focal numbness/tingling/weakness except for numbness on the left lateral thigh. She has chronic pain and takes 3-4 Oxycodone daily instead of every 4 hours. They report counting out her pills. She soiled herself one time. She denies any significant head injuries or alcohol intake. Her mother and nephew were diagnosed with Alzheimer's disease. Her son also came to the visit but states he lives an hour away and has noticed she forgets names and repeats conversations. Her daughter had written down on the journal that there are issues with the son.    PAST MEDICAL HISTORY: Past Medical History:  Diagnosis Date  . Allergic rhinitis   . Blood  transfusion   . Chronic back pain    [facility] Od[facility]   . COPD (chronic obstructive pulmonary disease) (HCC)   . Diverticulosis    hx  . Gallstones   . GERD (gastroesophageal reflux disease)   . Hiatal hernia   . HLD (hyperlipidemia)   . HTN (hypertension)   . Irregular cardiac rhythm   . Osteoarthritis   . PUD (peptic ulcer disease)    duodenal 1980s    MEDICATIONS: Current Outpatient Prescriptions on File Prior to Visit  Medication Sig Dispense Refill  . albuterol (PROVENTIL HFA) 108 (90 Base) MCG/ACT inhaler Inhale 2 puffs into the lungs every 4 (four) hours as needed for wheezing or shortness of breath. 1 Inhaler 11  . alprazolam (XANAX) 2 MG tablet TAKE 1 TABLET BY MOUTH 3 TIMES A DAY AS NEEDED 90 tablet 5  . aspirin 81 MG EC tablet Take 81 mg by mouth daily.      Marland Kitchen. atenolol (TENORMIN) 25 MG tablet TAKE 1 TABLET BY MOUTH EVERY DAY  30 tablet 5  . baclofen (LIORESAL) 10 MG tablet TAKE 1 TABLET BY MOUTH 3 TIMES A DAY AS NEEDED 90 tablet 5  . beclomethasone (QVAR) 40 MCG/ACT inhaler Inhale 2 puffs into the lungs 2 (two) times daily. 1 Inhaler 11  . cetirizine-pseudoephedrine (ZYRTEC-D) 5-120 MG per tablet Take 1 tablet by mouth 2 (two) times daily.    . diclofenac sodium (VOLTAREN) 1 % GEL Apply 1 application topically every 6 (six) hours as needed (knee pain ). 100 g 11  . gabapentin (NEURONTIN) 100 MG capsule TAKE 1 CAPSULE (100 MG TOTAL) BY MOUTH 3 (THREE) TIMES DAILY. 90 capsule 1  . meloxicam (MOBIC) 15 MG tablet TAKE 1 TABLET BY MOUTH EVERY DAY 90 tablet 3  . metroNIDAZOLE (METROGEL) 0.75 % vaginal gel INSERT 1 APPLICATORFUL VAGINALLY AT BEDTIME  0  . Multiple Vitamin (MULTIVITAMIN) tablet Take 1 tablet by mouth daily.    . Oxycodone HCl 20 MG TABS Take 1 tablet (20 mg total) by mouth every 4 (four) hours as needed (pain). 180 tablet 0  . polyethylene glycol powder (GLYCOLAX/MIRALAX) powder Take 17 g by mouth daily.    . promethazine (PHENERGAN) 25  MG tablet Take 1 tablet (25 mg total) by mouth every 4 (four) hours as needed for nausea. (Patient not taking: Reported on 05/01/2017) 60 tablet 5  . sertraline (ZOLOFT) 100 MG tablet TAKE 1 TABLET (100 MG TOTAL) BY MOUTH DAILY. 30 tablet 11  . sulfamethoxazole-trimethoprim (BACTRIM DS,SEPTRA DS) 800-160 MG tablet Take 1 tablet by mouth 2 (two) times daily. 14 tablet 0  . sulfamethoxazole-trimethoprim (BACTRIM DS,SEPTRA DS) 800-160 MG tablet Take 1 tablet by mouth 2 (two) times daily. 14 tablet 0  . Vitamin D, Ergocalciferol, (DRISDOL) 50000 units CAPS capsule TAKE 1 CAPSULE BY MOUTH EVERY 7 DAYS. 4 capsule 11   No current facility-administered medications on file prior to visit.     ALLERGIES: Allergies  Allergen Reactions  . Codeine Itching  . Penicillins Hives    FAMILY HISTORY: Family History  Problem Relation Age of Onset  . Cancer - Cervical Daughter     SOCIAL HISTORY: Social History   Social History  . Marital status: Married    Spouse name: N/A  . Number of children: N/A  . Years of education: N/A   Occupational History  . Not on file.   Social History Main Topics  . Smoking status: Current Every Day Smoker    Packs/day: 1.00    Types: Cigarettes  . Smokeless tobacco: Never Used     Comment: less than a pack  . Alcohol use No  . Drug use: No  . Sexual activity: No   Other Topics Concern  . Not on file   Social History Narrative   Married, 3 children (1B / 2G)   Retired Scientist, physiological   Daily caffeine: 2-3 drinks daily     REVIEW OF SYSTEMS: Constitutional: No fevers, chills, or sweats, no generalized fatigue, change in appetite Eyes: No visual changes, double vision, eye pain Ear, nose and throat: No hearing loss, ear pain, nasal congestion, sore throat Cardiovascular: No chest pain, palpitations Respiratory:  No shortness of breath at rest or with exertion, wheezes GastrointestinaI: No nausea, vomiting, diarrhea, abdominal pain, fecal  incontinence Genitourinary:  No dysuria, urinary retention or frequency Musculoskeletal:  + neck pain, back pain Integumentary: No rash, pruritus, skin lesions Neurological: as above Psychiatric: No depression, insomnia, anxiety Endocrine: No palpitations, fatigue, diaphoresis, mood swings, change in appetite, change in weight, increased  thirst Hematologic/Lymphatic:  No anemia, purpura, petechiae. Allergic/Immunologic: no itchy/runny eyes, nasal congestion, recent allergic reactions, rashes  PHYSICAL EXAM: Vitals:   09/10/17 1609  BP: 128/70  Pulse: 66  SpO2: 92%   General: No acute distress Head:  Normocephalic/atraumatic Neck: supple, no paraspinal tenderness, full range of motion Heart:  Regular rate and rhythm Lungs:  Clear to auscultation bilaterally Back: No paraspinal tenderness Skin/Extremities: No rash, no edema Neurological Exam: alert and oriented to person, place, and time. No aphasia or dysarthria. Fund of knowledge is appropriate.  Recent and remote memory are impaired.  Attention and concentration are normal.    Able to name objects and repeat phrases.  MMSE - Mini Mental State Exam 09/10/2017 03/12/2017  Orientation to time 5 4  Orientation to Place 5 5  Registration 3 3  Attention/ Calculation 5 5  Recall 0 0  Language- name 2 objects 2 2  Language- repeat 1 1  Language- follow 3 step command 3 3  Language- read & follow direction 1 1  Write a sentence 1 1  Copy design 0 0  Total score 26 25   Cranial nerves: Pupils equal, round, reactive to light. Extraocular movements intact with no nystagmus. Visual fields full. Facial sensation intact. No facial asymmetry. Tongue, uvula, palate midline.  Motor: Bulk and tone normal, muscle strength 5/5 throughout with no pronator drift.  Sensation to light touch intact.  No extinction to double simultaneous stimulation.  Deep tendon reflexes +1 throughout, toes downgoing.  Finger to nose testing intact.  Gait narrow-based  with walker, no ataxia.  IMPRESSION: This is a 76 yo RH woman with a history of hypertension, hyperlipidemia, COPD, chronic back pain, with memory loss. Her daughter expressed concern about worsening, she has difficulties managing her medications, and frequently misplaces her pain medication. MMSE today 26/30 (25/30 in April 2018), history suggestive of mild dementia. MRI brain no acute changes. TSH, B12, RPR normal. She is agreeable to start Aricept 5mg  daily for a month, then increase to 10mg  daily. Side effects were discussed. She was advised to start letting her family administer medications. Recommend 24/7 supervision, her daughter is interested in home aides, information given. We also discussed having a POA to help with managing finances as well. We discussed home safety.She will follow-up in 6 months, she and family know to call for any changes.   Thank you for allowing me to participate in her care.  Please do not hesitate to call for any questions or concerns.  The duration of this appointment visit was 25 minutes of face-to-face time with the patient.  Greater than 50% of this time was spent in counseling, explanation of diagnosis, planning of further management, and coordination of care.   Patrcia Dolly, M.D.   CC: Dr. Clent Ridges

## 2017-09-10 NOTE — Patient Instructions (Addendum)
With mild dementia, it is important to have more help with your medications. It is strongly recommended that family will be giving medication to you to ensure that they are not forgotten, and use a checklist that everyone can use. Recommend doing the same thing with your bills. Also recommend starting Aricept 10mg : Take 1/2 tablet daily for a month, then increase to 1 tablet daily. Recommend having someone at home with you for supervision. Followup in 6 months, call for any changes.  FALL PRECAUTIONS: Be cautious when walking. Scan the area for obstacles that may increase the risk of trips and falls. When getting up in the mornings, sit up at the edge of the bed for a few minutes before getting out of bed. Consider elevating the bed at the head end to avoid drop of blood pressure when getting up. Walk always in a well-lit room (use night lights in the walls). Avoid area rugs or power cords from appliances in the middle of the walkways. Use a walker or a cane if necessary and consider physical therapy for balance exercise. Get your eyesight checked regularly.  FINANCIAL OVERSIGHT: Supervision, especially oversight when making financial decisions or transactions is also recommended.  HOME SAFETY: Consider the safety of the kitchen when operating appliances like stoves, microwave oven, and blender. Consider having supervision and share cooking responsibilities until no longer able to participate in those. Accidents with firearms and other hazards in the house should be identified and addressed as well.  DRIVING: Regarding driving, in patients with progressive memory problems, driving will be impaired. We advise to have someone else do the driving if trouble finding directions or if minor accidents are reported. Independent driving assessment is available to determine safety of driving.  ABILITY TO BE LEFT ALONE: If patient is unable to contact 911 operator, consider using LifeLine, or when the need is there,  arrange for someone to stay with patients. Smoking is a fire hazard, consider supervision or cessation. Risk of wandering should be assessed by caregiver and if detected at any point, supervision and safe proof recommendations should be instituted.  MEDICATION SUPERVISION: Inability to self-administer medication needs to be constantly addressed. Implement a mechanism to ensure safe administration of the medications.  RECOMMENDATIONS FOR ALL PATIENTS WITH MEMORY PROBLEMS: 1. Continue to exercise (Recommend 30 minutes of walking everyday, or 3 hours every week) 2. Increase social interactions - continue going to [place] and enjoy social gatherings with friends and family 3. Eat healthy, avoid fried foods and eat more fruits and vegetables 4. Maintain adequate blood pressure, blood sugar, and blood cholesterol level. Reducing the risk of stroke and cardiovascular disease also helps promoting better memory. 5. Avoid stressful situations. Live a simple life and avoid aggravations. Organize your time and prepare for the next day in anticipation. 6. Sleep well, avoid any interruptions of sleep and avoid any distractions in the bedroom that may interfere with adequate sleep quality 7. Avoid sugar, avoid sweets as there is a strong link between excessive sugar intake, diabetes, and cognitive impairment We discussed the Mediterranean diet, which has been shown to help patients reduce the risk of progressive memory disorders and reduces cardiovascular risk. This includes eating fish, eat fruits and green leafy vegetables, nuts like almonds and hazelnuts, walnuts, and also use olive oil. Avoid fast foods and fried foods as much as possible. Avoid sweets and sugar as sugar use has been linked to worsening of memory function.  There is always a concern of gradual progression of memory  problems. If this is the case, then we may need to adjust level of care according to patient needs. Support, both to the patient and  caregiver, should then be put into place.

## 2017-09-11 NOTE — Telephone Encounter (Signed)
Noted  

## 2017-09-14 DIAGNOSIS — F0390 Unspecified dementia without behavioral disturbance: Secondary | ICD-10-CM | POA: Insufficient documentation

## 2017-09-14 DIAGNOSIS — F03A0 Unspecified dementia, mild, without behavioral disturbance, psychotic disturbance, mood disturbance, and anxiety: Secondary | ICD-10-CM | POA: Insufficient documentation

## 2017-09-24 ENCOUNTER — Ambulatory Visit: Payer: Medicare Other | Admitting: Family Medicine

## 2017-09-24 ENCOUNTER — Telehealth: Payer: Self-pay | Admitting: Neurology

## 2017-09-24 NOTE — Telephone Encounter (Signed)
Patient's daughter lmom  and needs to speak with you. She said that her mom was just recently diagnosed with Dementia. She is also her Caregiver. Please Call. Thanks

## 2017-10-02 ENCOUNTER — Telehealth: Payer: Self-pay | Admitting: Family Medicine

## 2017-10-02 MED ORDER — OXYCODONE HCL 20 MG PO TABS: 1.0000 | ORAL_TABLET | ORAL | 0 refills | Status: DC | PRN

## 2017-10-02 NOTE — Telephone Encounter (Signed)
Copied from CRM [phone]. Topic: Quick Communication - See Telephone Encounter >> Oct 02, 2017  2:44 PM Arlyss Gandyichardson, Kamel Haven N, NT wrote: CRM for notification. See Telephone encounter for: Needing refill of 20mg  oxycodone. Please call daughter when ready for pick up.  10/02/17.

## 2017-10-02 NOTE — Telephone Encounter (Signed)
Requesting Oxycodone

## 2017-10-02 NOTE — Telephone Encounter (Signed)
Sent to PCP for approval.  

## 2017-10-02 NOTE — Telephone Encounter (Signed)
Done

## 2017-10-03 NOTE — Telephone Encounter (Signed)
Pt advised and voiced understanding. Rx placed up front for pick up.  

## 2017-10-18 ENCOUNTER — Other Ambulatory Visit: Payer: Self-pay | Admitting: Family Medicine

## 2017-10-19 ENCOUNTER — Telehealth: Payer: Self-pay | Admitting: Neurology

## 2017-10-19 NOTE — Telephone Encounter (Signed)
Spoke with pt's daughter.   She states that pt does not believe her or other family members when they tell her that Dr. Karel JarvisAquino suggested that family manage pt's medications.  Printed LOV AVS, which states Dr. Karel JarvisAquino recommendations.  AVS placed up front for pick up

## 2017-10-19 NOTE — Telephone Encounter (Signed)
Shelly lmom that she would like you to please call her regarding Kathie RhodesBetty. Thanks

## 2017-10-22 ENCOUNTER — Other Ambulatory Visit: Payer: Self-pay | Admitting: Family Medicine

## 2017-10-24 NOTE — Telephone Encounter (Signed)
Sent to PCP for approval.  

## 2017-10-24 NOTE — Telephone Encounter (Signed)
Call in #[phone] with 5 rf 

## 2017-10-31 ENCOUNTER — Encounter: Payer: Self-pay | Admitting: Family Medicine

## 2017-10-31 ENCOUNTER — Ambulatory Visit (INDEPENDENT_AMBULATORY_CARE_PROVIDER_SITE_OTHER): Payer: Medicare Other | Admitting: Family Medicine

## 2017-10-31 VITALS — BP 126/80 | HR 63 | Temp 98.1°F | Wt 136.0 lb

## 2017-10-31 DIAGNOSIS — Z23 Encounter for immunization: Secondary | ICD-10-CM

## 2017-10-31 DIAGNOSIS — M48061 Spinal stenosis, lumbar region without neurogenic claudication: Secondary | ICD-10-CM

## 2017-10-31 DIAGNOSIS — F1190 Opioid use, unspecified, uncomplicated: Secondary | ICD-10-CM | POA: Diagnosis not present

## 2017-10-31 MED ORDER — OXYCODONE HCL 20 MG PO TABS: 1.0000 | ORAL_TABLET | ORAL | 0 refills | Status: DC | PRN

## 2017-11-02 ENCOUNTER — Encounter: Payer: Self-pay | Admitting: Family Medicine

## 2017-11-02 NOTE — Progress Notes (Signed)
   Subjective:    Patient ID: Ferne ReusBetty J Wittmann, female    DOB: Sep 14, 1941, 76 y.o.   MRN: [id number]  HPI Here for a pain management visit. She is doing well and her pain levels are stable.  Indication for chronic opioid: low back pain  Medication and dose: oxycodone 20 mg # pills per month: 180 Last UDS date: 10-31-17 Pain contract signed (Y/N): 10-31-17 Date narcotic database last reviewed (include red flags): 10-31-17    Review of Systems  Constitutional: Negative.   Respiratory: Negative.   Cardiovascular: Negative.   Musculoskeletal: Positive for back pain.  Neurological: Negative.        Objective:   Physical Exam  Constitutional: She is oriented to person, place, and time. She appears well-developed and well-nourished.  Cardiovascular: Normal rate, regular rhythm, normal heart sounds and intact distal pulses.  Pulmonary/Chest: Effort normal and breath sounds normal. No respiratory distress. She has no wheezes. She has no rales.  Neurological: She is alert and oriented to person, place, and time.          Assessment & Plan:  Lumbar spinal stenosis, stable. Medication was refilled.  Gershon CraneStephen Maxemiliano Riel, MD

## 2017-11-04 LAB — PAIN MGMT, PROFILE 8 W/CONF, U
6 Acetylmorphine: NEGATIVE ng/mL (ref <10)
ALPHAHYDROXYMIDAZOLAM: NEGATIVE ng/mL (ref <50)
ALPHAHYDROXYTRIAZOLAM: NEGATIVE ng/mL (ref <50)
AMINOCLONAZEPAM: NEGATIVE ng/mL (ref <25)
AMPHETAMINES: NEGATIVE ng/mL (ref <500)
Alcohol Metabolites: NEGATIVE ng/mL (ref <500)
Alphahydroxyalprazolam: 568 ng/mL — ABNORMAL HIGH (ref <25)
BENZODIAZEPINES: POSITIVE ng/mL — AB (ref <100)
BUPRENORPHINE, URINE: NEGATIVE ng/mL (ref <5)
BUPRENORPHINE: NEGATIVE ng/mL (ref <2)
COCAINE METABOLITE: NEGATIVE ng/mL (ref <150)
CODEINE: NEGATIVE ng/mL (ref <50)
CREATININE: 124.9 mg/dL
HYDROMORPHONE: NEGATIVE ng/mL (ref <50)
Hydrocodone: NEGATIVE ng/mL (ref <50)
Hydroxyethylflurazepam: NEGATIVE ng/mL (ref <50)
Lorazepam: NEGATIVE ng/mL (ref <50)
MDMA: NEGATIVE ng/mL (ref <500)
Marijuana Metabolite: NEGATIVE ng/mL (ref <20)
Morphine: NEGATIVE ng/mL (ref <50)
NORBUPRENORPHINE: NEGATIVE ng/mL (ref <2)
NORHYDROCODONE: NEGATIVE ng/mL (ref <50)
Nordiazepam: NEGATIVE ng/mL (ref <50)
OXAZEPAM: NEGATIVE ng/mL (ref <50)
OXIDANT: NEGATIVE ug/mL (ref <200)
OXYCODONE: 7793 ng/mL — AB (ref <50)
OXYCODONE: POSITIVE ng/mL — AB (ref <100)
Opiates: NEGATIVE ng/mL (ref <100)
Oxymorphone: 3625 ng/mL — ABNORMAL HIGH (ref <50)
PH: 6.08 (ref 4.5–9.0)
TEMAZEPAM: NEGATIVE ng/mL (ref <50)

## 2017-11-05 ENCOUNTER — Telehealth: Payer: Self-pay | Admitting: Family Medicine

## 2017-11-05 DIAGNOSIS — E559 Vitamin D deficiency, unspecified: Secondary | ICD-10-CM

## 2017-11-05 NOTE — Telephone Encounter (Signed)
LOV 10-31-17 with Dr. Clent RidgesFry / Last refill advised to have labs checked: : Vitamin D, Ergocalciferol, (DRISDOL) 50000 units CAPS capsule 4 capsule 0 10/22/2017    Sig: TAKE ONE CAPSULE BY MOUTH EVERY 7 DAYS OR EVERY WEEK   Notes to Pharmacy: Please schedule an appointment to have your Vit D level checked - thanks / No recent labs or any scheduled

## 2017-11-05 NOTE — Telephone Encounter (Signed)
Sent to PCP to put orders in to recheck Vit D will call pt to schedule an appt.

## 2017-11-05 NOTE — Telephone Encounter (Signed)
The order is placed. She can make a lab appt

## 2017-11-05 NOTE — Telephone Encounter (Signed)
Copied from CRM [phone]. Topic: Quick Communication - Rx Refill/Question >> Nov 05, 2017 10:14 AM Landry MellowFoltz, [name] J wrote: Has the patient contacted their pharmacy? Yes.     (Agent: If no, request that the patient contact the pharmacy for the refill.)   Preferred Pharmacy (with phone number or street name): [facility] [address]  - pt would like 90 day supply of vit d cb # [phone]    Agent: Please be advised that RX refills may take up to 3 business days. We ask that you follow-up with your pharmacy.

## 2017-11-07 NOTE — Telephone Encounter (Signed)
Pt daughter stated that she will call the office to schedule an appointment for patients Vit D recheck.

## 2017-11-14 ENCOUNTER — Other Ambulatory Visit: Payer: Self-pay | Admitting: Family Medicine

## 2017-11-15 NOTE — Telephone Encounter (Signed)
Pt does need to have her vit D retested before we send in more refills correct ? If so send message back to me and I'll call to get her scheduled for lab work

## 2017-11-16 NOTE — Telephone Encounter (Signed)
Yes set her up for a vitamin D level first

## 2017-11-22 ENCOUNTER — Other Ambulatory Visit: Payer: Self-pay | Admitting: Family Medicine

## 2017-11-25 ENCOUNTER — Other Ambulatory Visit: Payer: Self-pay | Admitting: Family Medicine

## 2017-11-27 ENCOUNTER — Telehealth: Payer: Self-pay

## 2017-11-27 NOTE — Telephone Encounter (Signed)
[facility] [address] [place]  RF vit D 50,000 unit cap  Called pt and left a VM that she needs to schedule to have her vit D level rested in order for us to find out if she needs this Rx refilled again or not.   Faxed a note to the pharmacy stating that pt will need lab work inorder to get a refill

## 2017-11-28 ENCOUNTER — Telehealth: Payer: Self-pay | Admitting: Family Medicine

## 2017-11-28 NOTE — Telephone Encounter (Signed)
Attempted to call pt.  Left voice message that she is due for a recheck on Vitamin D level.  The Vitamin D 50,000 unit prescription was originally written for 4 weeks, and then was to have Vita D level rechecked.

## 2017-11-28 NOTE — Telephone Encounter (Unsigned)
Copied from CRM [phone]. Topic: Quick Communication - Rx Refill/Question >> Nov 28, 2017  9:52 AM Waymon AmatoBurton, Donna F wrote: Medication: vitamin D2   Has the patient contacted their pharmacy? Yes and they told pt to call office   (Agent: If no, request that the patient contact the pharmacy for the refill.)   Preferred Pharmacy (with phone number or street name): [facility] [address] [phone]   Agent: Please be advised that RX refills may take up to 3 business days. We ask that you follow-up with your pharmacy.

## 2017-12-03 ENCOUNTER — Other Ambulatory Visit: Payer: Self-pay | Admitting: Family Medicine

## 2017-12-13 ENCOUNTER — Other Ambulatory Visit: Payer: Self-pay

## 2017-12-13 MED ORDER — ALBUTEROL SULFATE HFA 108 (90 BASE) MCG/ACT IN AERS: 2.0000 | INHALATION_SPRAY | RESPIRATORY_TRACT | 11 refills | Status: DC | PRN

## 2017-12-18 ENCOUNTER — Other Ambulatory Visit: Payer: Self-pay | Admitting: Family Medicine

## 2018-01-04 ENCOUNTER — Ambulatory Visit: Payer: Self-pay | Admitting: *Deleted

## 2018-01-04 ENCOUNTER — Telehealth: Payer: Self-pay | Admitting: *Deleted

## 2018-01-04 NOTE — Telephone Encounter (Signed)
Prior auth for Albuterol sul HFA inhaler sent to [email or web address].

## 2018-01-04 NOTE — Telephone Encounter (Signed)
[facility] [place] [facility] [employer]. [facility]  this encounter concerning the patient's medication, Albuterol, has been resolved..Marland Kitchen

## 2018-01-07 ENCOUNTER — Telehealth: Payer: Self-pay | Admitting: Family Medicine

## 2018-01-07 DIAGNOSIS — H40033 Anatomical narrow angle, bilateral: Secondary | ICD-10-CM | POA: Diagnosis not present

## 2018-01-07 DIAGNOSIS — H5203 Hypermetropia, bilateral: Secondary | ICD-10-CM | POA: Diagnosis not present

## 2018-01-07 DIAGNOSIS — H3589 Other specified retinal disorders: Secondary | ICD-10-CM | POA: Diagnosis not present

## 2018-01-07 DIAGNOSIS — H40053 Ocular hypertension, bilateral: Secondary | ICD-10-CM | POA: Diagnosis not present

## 2018-01-07 DIAGNOSIS — H40023 Open angle with borderline findings, high risk, bilateral: Secondary | ICD-10-CM | POA: Diagnosis not present

## 2018-01-07 DIAGNOSIS — H52223 Regular astigmatism, bilateral: Secondary | ICD-10-CM | POA: Diagnosis not present

## 2018-01-07 DIAGNOSIS — H25013 Cortical age-related cataract, bilateral: Secondary | ICD-10-CM | POA: Diagnosis not present

## 2018-01-07 DIAGNOSIS — H04123 Dry eye syndrome of bilateral lacrimal glands: Secondary | ICD-10-CM | POA: Diagnosis not present

## 2018-01-07 DIAGNOSIS — H16223 Keratoconjunctivitis sicca, not specified as Sjogren's, bilateral: Secondary | ICD-10-CM | POA: Diagnosis not present

## 2018-01-07 NOTE — Telephone Encounter (Signed)
Called daughter Jerlyn LyJolie Brown and left message that [facility] ventolin instead of Albuterol

## 2018-01-07 NOTE — Telephone Encounter (Signed)
Copied from CRM [phone]. Topic: Quick Communication - Rx Refill/Question >> Jan 07, 2018  1:26 PM Raquel SarnaHayes, Teresa G wrote: Albuterol FUL HFA 90 mcg inh  Ref #  [id number] [facility] [employer] [facility] clinical review [phone] [phone]  Need to discuss clinical questions on the Rx.

## 2018-01-07 NOTE — Telephone Encounter (Signed)
[employer] [facility] [employer]- Insurance will cover Ventolin instead of Albuterol. Attempted to leave message with daughter.

## 2018-02-04 ENCOUNTER — Other Ambulatory Visit: Payer: Self-pay | Admitting: Family Medicine

## 2018-02-05 ENCOUNTER — Ambulatory Visit (INDEPENDENT_AMBULATORY_CARE_PROVIDER_SITE_OTHER): Payer: Medicare HMO | Admitting: Family Medicine

## 2018-02-05 ENCOUNTER — Encounter: Payer: Self-pay | Admitting: Family Medicine

## 2018-02-05 VITALS — BP 110/62 | HR 59 | Temp 97.9°F | Ht 62.0 in | Wt 139.0 lb

## 2018-02-05 DIAGNOSIS — F1190 Opioid use, unspecified, uncomplicated: Secondary | ICD-10-CM | POA: Diagnosis not present

## 2018-02-05 DIAGNOSIS — M48061 Spinal stenosis, lumbar region without neurogenic claudication: Secondary | ICD-10-CM

## 2018-02-05 MED ORDER — OXYCODONE HCL 20 MG PO TABS: 1.0000 | ORAL_TABLET | ORAL | 0 refills | Status: DC | PRN

## 2018-02-05 MED ORDER — IBUPROFEN 800 MG PO TABS: 800.0000 mg | ORAL_TABLET | Freq: Three times a day (TID) | ORAL | 5 refills | Status: DC | PRN

## 2018-02-05 NOTE — Progress Notes (Signed)
   Subjective:    Patient ID: Angelica [name], female    DOB: 1941/05/16, 77 y.o.   MRN: [id number]  HPI Here for pain management. She is doing well.  Indication for chronic opioid: low back pain Medication and dose: Oxycodone 20 mg  # pills per month: 180 Last UDS date: 10-31-17 Opioid Treatment Agreement signed (Y/N): 02-05-18 Opioid Treatment Agreement last reviewed with patient: 02-05-18 [facility] reviewed this encounter (include red flags): 02-05-18    Review of Systems  Constitutional: Negative.   Respiratory: Negative.   Cardiovascular: Negative.   Musculoskeletal: Positive for back pain.  Neurological: Negative.        Objective:   Physical Exam  Constitutional: She is oriented to person, place, and time. She appears well-developed and well-nourished.  Cardiovascular: Normal rate, regular rhythm, normal heart sounds and intact distal pulses.  Pulmonary/Chest: Effort normal and breath sounds normal. No respiratory distress. She has no wheezes. She has no rales.  Neurological: She is alert and oriented to person, place, and time.          Assessment & Plan:  Pain management. Meds were refilled.  Gershon CraneStephen Xaniyah Buchholz, MD

## 2018-02-15 ENCOUNTER — Encounter: Payer: Self-pay | Admitting: Family Medicine

## 2018-02-15 ENCOUNTER — Ambulatory Visit (INDEPENDENT_AMBULATORY_CARE_PROVIDER_SITE_OTHER): Payer: Medicare HMO | Admitting: Family Medicine

## 2018-02-15 ENCOUNTER — Encounter: Payer: Self-pay | Admitting: Internal Medicine

## 2018-02-15 VITALS — BP 110/58 | HR 58 | Temp 97.7°F | Ht 62.0 in | Wt 135.2 lb

## 2018-02-15 DIAGNOSIS — R1084 Generalized abdominal pain: Secondary | ICD-10-CM | POA: Diagnosis not present

## 2018-02-15 DIAGNOSIS — K5732 Diverticulitis of large intestine without perforation or abscess without bleeding: Secondary | ICD-10-CM | POA: Diagnosis not present

## 2018-02-15 MED ORDER — CIPROFLOXACIN HCL 500 MG PO TABS: 500.0000 mg | ORAL_TABLET | Freq: Two times a day (BID) | ORAL | 0 refills | Status: DC

## 2018-02-15 MED ORDER — METRONIDAZOLE 500 MG PO TABS: 500.0000 mg | ORAL_TABLET | Freq: Three times a day (TID) | ORAL | 0 refills | Status: DC

## 2018-02-15 NOTE — Progress Notes (Signed)
   Subjective:    Patient ID: Angelica ReusBetty J Daddario, female    DOB: 1941/01/28, 77 y.o.   MRN: [id number]  HPI Here for intermittent abdominal pains in the past 2 months, which have gotten worse this past week. These are generalized over the entire abdomen. She had been constipated until she saw us recently, and we suggested she take Miralax daily. She has done so and now her BMs are soft and regular. She gets nauseated with the pains but does not vomit. No fevers. No UTI symptoms.    Review of Systems  Constitutional: Negative.   Respiratory: Negative.   Cardiovascular: Negative.   Gastrointestinal: Positive for abdominal pain and nausea. Negative for abdominal distention, anal bleeding, blood in stool, constipation, diarrhea, rectal pain and vomiting.  Genitourinary: Negative.        Objective:   Physical Exam  Constitutional: She appears well-developed and well-nourished. No distress.  Cardiovascular: Normal rate, regular rhythm, normal heart sounds and intact distal pulses.  Pulmonary/Chest: Effort normal and breath sounds normal. No respiratory distress. She has no wheezes. She has no rales.  Abdominal: Soft. Bowel sounds are normal. She exhibits no distension and no mass. There is no rebound and no guarding.  Mildly tender diffusely          Assessment & Plan:  Diverticulitis, treat with Cipro and Flagyl. She is past due for a colonoscopy, so we will refer her to GI to be re-established.  Gershon CraneStephen Fry, MD

## 2018-02-19 ENCOUNTER — Ambulatory Visit (INDEPENDENT_AMBULATORY_CARE_PROVIDER_SITE_OTHER): Payer: Medicare HMO

## 2018-02-19 VITALS — BP 100/60 | HR 64 | Ht 62.0 in | Wt 134.4 lb

## 2018-02-19 DIAGNOSIS — Z0000 Encounter for general adult medical examination without abnormal findings: Secondary | ICD-10-CM

## 2018-02-19 NOTE — Patient Instructions (Addendum)
Angelica [name] , Thank you for taking time to come for your Medicare Wellness Visit. I appreciate your ongoing commitment to your health goals. Please review the following plan we discussed and let me know if I can assist you in the future.   Angelica [name] to fup for resources from the [employer] of which the Dtr and Ms. Kenton agreed.   [employer]  [facility]; [phone] Management consultant; Information regarding [place] do need to update your tetanus. A Tetanus is recommended every 10 years. Medicare covers a tetanus if you have a cut or wound; otherwise, there may be a charge. If you had not had a tetanus with pertusses, known as the Tdap, you can take this anytime.   Shingrix is a vaccine for the prevention of Shingles in Adults 50 and older.  If you are on Medicare, you can request a prescription from your doctor to be filled at a pharmacy.  Please check with your benefits regarding applicable copays or out of pocket expenses.  The Shingrix is given in 2 vaccines approx 8 weeks apart. You must receive the 2nd dose prior to 6 months from receipt of the first.    These are the goals we discussed: Goals    . Patient Stated     Long term care planning        This is a list of the screening recommended for you and due dates:  Health Maintenance  Topic Date Due  . Tetanus Vaccine  06/30/1960  . DEXA scan (bone density measurement)  06/30/2006  . Flu Shot  06/13/2018  . Pneumonia vaccines  Completed      Fall Prevention in the Home Falls can cause injuries. They can happen to people of all ages. There are many things you can do to make your home safe and to help prevent falls. What can I do on the outside of my home?  Regularly fix the edges of walkways and driveways and fix any cracks.  Remove anything that might make you trip as you walk through a door, such as a raised step or threshold.  Trim any bushes or trees on the path to  your home.  Use bright outdoor lighting.  Clear any walking paths of anything that might make someone trip, such as rocks or tools.  Regularly check to see if handrails are loose or broken. Make sure that both sides of any steps have handrails.  Any raised decks and porches should have guardrails on the edges.  Have any leaves, snow, or ice cleared regularly.  Use sand or salt on walking paths during winter.  Clean up any spills in your garage right away. This includes oil or grease spills. What can I do in the bathroom?  Use night lights.  Install grab bars by the toilet and in the tub and shower. Do not use towel bars as grab bars.  Use non-skid mats or decals in the tub or shower.  If you need to sit down in the shower, use a plastic, non-slip stool.  Keep the floor dry. Clean up any water that spills on the floor as soon as it happens.  Remove soap buildup in the tub or shower regularly.  Attach bath mats securely with double-sided non-slip rug tape.  Do not have throw rugs and other things on the floor that can make you trip. What can I do in the bedroom?  Use night lights.  Make sure that you have  a light by your bed that is easy to reach.  Do not use any sheets or blankets that are too big for your bed. They should not hang down onto the floor.  Have a firm chair that has side arms. You can use this for support while you get dressed.  Do not have throw rugs and other things on the floor that can make you trip. What can I do in the kitchen?  Clean up any spills right away.  Avoid walking on wet floors.  Keep items that you use a lot in easy-to-reach places.  If you need to reach something above you, use a strong step stool that has a grab bar.  Keep electrical cords out of the way.  Do not use floor polish or wax that makes floors slippery. If you must use wax, use non-skid floor wax.  Do not have throw rugs and other things on the floor that can make  you trip. What can I do with my stairs?  Do not leave any items on the stairs.  Make sure that there are handrails on both sides of the stairs and use them. Fix handrails that are broken or loose. Make sure that handrails are as long as the stairways.  Check any carpeting to make sure that it is firmly attached to the stairs. Fix any carpet that is loose or worn.  Avoid having throw rugs at the top or bottom of the stairs. If you do have throw rugs, attach them to the floor with carpet tape.  Make sure that you have a light switch at the top of the stairs and the bottom of the stairs. If you do not have them, ask someone to add them for you. What else can I do to help prevent falls?  Wear shoes that: ? Do not have high heels. ? Have rubber bottoms. ? Are comfortable and fit you well. ? Are closed at the toe. Do not wear sandals.  If you use a stepladder: ? Make sure that it is fully opened. Do not climb a closed stepladder. ? Make sure that both sides of the stepladder are locked into place. ? Ask someone to hold it for you, if possible.  Clearly mark and make sure that you can see: ? Any grab bars or handrails. ? First and last steps. ? Where the edge of each step is.  Use tools that help you move around (mobility aids) if they are needed. These include: ? Canes. ? Walkers. ? Scooters. ? Crutches.  Turn on the lights when you go into a dark area. Replace any light bulbs as soon as they burn out.  Set up your furniture so you have a clear path. Avoid moving your furniture around.  If any of your floors are uneven, fix them.  If there are any pets around you, be aware of where they are.  Review your medicines with your doctor. Some medicines can make you feel dizzy. This can increase your chance of falling. Ask your doctor what other things that you can do to help prevent falls. This information is not intended to replace advice given to you by your health care provider.  Make sure you discuss any questions you have with your health care provider. Document Released: 08/26/2009 Document Revised: 04/06/2016 Document Reviewed: 12/04/2014 Elsevier Interactive Patient Education  2018 [employer] Maintenance, Female Adopting a healthy lifestyle and getting preventive care can go a long way to promote health and  wellness. Talk with your health care provider about what schedule of regular examinations is right for you. This is a good chance for you to check in with your provider about disease prevention and staying healthy. In between checkups, there are plenty of things you can do on your own. Experts have done a lot of research about which lifestyle changes and preventive measures are most likely to keep you healthy. Ask your health care provider for more information. Weight and diet Eat a healthy diet  Be sure to include plenty of vegetables, fruits, low-fat dairy products, and lean protein.  Do not eat a lot of foods high in solid fats, added sugars, or salt.  Get regular exercise. This is one of the most important things you can do for your health. ? Most adults should exercise for at least 150 minutes each week. The exercise should increase your heart rate and make you sweat (moderate-intensity exercise). ? Most adults should also do strengthening exercises at least twice a week. This is in addition to the moderate-intensity exercise.  Maintain a healthy weight  Body mass index (BMI) is a measurement that can be used to identify possible weight problems. It estimates body fat based on height and weight. Your health care provider can help determine your BMI and help you achieve or maintain a healthy weight.  For females 28 years of age and older: ? A BMI below 18.5 is considered underweight. ? A BMI of 18.5 to 24.9 is normal. ? A BMI of 25 to 29.9 is considered overweight. ? A BMI of 30 and above is considered obese.  Watch levels of cholesterol  and blood lipids  You should start having your blood tested for lipids and cholesterol at 77 years of age, then have this test every 5 years.  You may need to have your cholesterol levels checked more often if: ? Your lipid or cholesterol levels are high. ? You are older than 77 years of age. ? You are at high risk for heart disease.  Cancer screening Lung Cancer  Lung cancer screening is recommended for adults 31-54 years old who are at high risk for lung cancer because of a history of smoking.  A yearly low-dose CT scan of the lungs is recommended for people who: ? Currently smoke. ? Have quit within the past 15 years. ? Have at least a 30-pack-year history of smoking. A pack year is smoking an average of one pack of cigarettes a day for 1 year.  Yearly screening should continue until it has been 15 years since you quit.  Yearly screening should stop if you develop a health problem that would prevent you from having lung cancer treatment.  Breast Cancer  Practice breast self-awareness. This means understanding how your breasts normally appear and feel.  It also means doing regular breast self-exams. Let your health care provider know about any changes, no matter how small.  If you are in your 20s or 30s, you should have a clinical breast exam (CBE) by a health care provider every 1-3 years as part of a regular health exam.  If you are 93 or older, have a CBE every year. Also consider having a breast X-ray (mammogram) every year.  If you have a family history of breast cancer, talk to your health care provider about genetic screening.  If you are at high risk for breast cancer, talk to your health care provider about having an MRI and a mammogram every year.  Breast  cancer gene (BRCA) assessment is recommended for women who have family members with BRCA-related cancers. BRCA-related cancers include: ? Breast. ? Ovarian. ? Tubal. ? Peritoneal cancers.  Results of the  assessment will determine the need for genetic counseling and BRCA1 and BRCA2 testing.  Cervical Cancer Your health care provider may recommend that you be screened regularly for cancer of the pelvic organs (ovaries, uterus, and vagina). This screening involves a pelvic examination, including checking for microscopic changes to the surface of your cervix (Pap test). You may be encouraged to have this screening done every 3 years, beginning at age 28.  For women ages 67-65, health care providers may recommend pelvic exams and Pap testing every 3 years, or they may recommend the Pap and pelvic exam, combined with testing for human papilloma virus (HPV), every 5 years. Some types of HPV increase your risk of cervical cancer. Testing for HPV may also be done on women of any age with unclear Pap test results.  Other health care providers may not recommend any screening for nonpregnant women who are considered low risk for pelvic cancer and who do not have symptoms. Ask your health care provider if a screening pelvic exam is right for you.  If you have had past treatment for cervical cancer or a condition that could lead to cancer, you need Pap tests and screening for cancer for at least 20 years after your treatment. If Pap tests have been discontinued, your risk factors (such as having a new sexual partner) need to be reassessed to determine if screening should resume. Some women have medical problems that increase the chance of getting cervical cancer. In these cases, your health care provider may recommend more frequent screening and Pap tests.  Colorectal Cancer  This type of cancer can be detected and often prevented.  Routine colorectal cancer screening usually begins at 77 years of age and continues through 77 years of age.  Your health care provider may recommend screening at an earlier age if you have risk factors for colon cancer.  Your health care provider may also recommend using home test  kits to check for hidden blood in the stool.  A small camera at the end of a tube can be used to examine your colon directly (sigmoidoscopy or colonoscopy). This is done to check for the earliest forms of colorectal cancer.  Routine screening usually begins at age 32.  Direct examination of the colon should be repeated every 5-10 years through 77 years of age. However, you may need to be screened more often if early forms of precancerous polyps or small growths are found.  Skin Cancer  Check your skin from head to toe regularly.  Tell your health care provider about any new moles or changes in moles, especially if there is a change in a mole's shape or color.  Also tell your health care provider if you have a mole that is larger than the size of a pencil eraser.  Always use sunscreen. Apply sunscreen liberally and repeatedly throughout the day.  Protect yourself by wearing long sleeves, pants, a wide-brimmed hat, and sunglasses whenever you are outside.  Heart disease, diabetes, and high blood pressure  High blood pressure causes heart disease and increases the risk of stroke. High blood pressure is more likely to develop in: ? People who have blood pressure in the high end of the normal range (130-139/85-89 mm Hg). ? People who are overweight or obese. ? People who are African American.  If you are 64-30 years of age, have your blood pressure checked every 3-5 years. If you are 68 years of age or older, have your blood pressure checked every year. You should have your blood pressure measured twice-once when you are at a hospital or clinic, and once when you are not at a hospital or clinic. Record the average of the two measurements. To check your blood pressure when you are not at a hospital or clinic, you can use: ? An automated blood pressure machine at a pharmacy. ? A home blood pressure monitor.  If you are between 25 years and 62 years old, ask your health care provider if you  should take aspirin to prevent strokes.  Have regular diabetes screenings. This involves taking a blood sample to check your fasting blood sugar level. ? If you are at a normal weight and have a low risk for diabetes, have this test once every three years after 77 years of age. ? If you are overweight and have a high risk for diabetes, consider being tested at a younger age or more often. Preventing infection Hepatitis B  If you have a higher risk for hepatitis B, you should be screened for this virus. You are considered at high risk for hepatitis B if: ? You were born in a country where hepatitis B is common. Ask your health care provider which countries are considered high risk. ? Your parents were born in a high-risk country, and you have not been immunized against hepatitis B (hepatitis B vaccine). ? You have HIV or AIDS. ? You use needles to inject street drugs. ? You live with someone who has hepatitis B. ? You have had sex with someone who has hepatitis B. ? You get hemodialysis treatment. ? You take certain medicines for conditions, including cancer, organ transplantation, and autoimmune conditions.  Hepatitis C  Blood testing is recommended for: ? Everyone born from [birth year] through 1965. ? Anyone with known risk factors for hepatitis C.  Sexually transmitted infections (STIs)  You should be screened for sexually transmitted infections (STIs) including gonorrhea and chlamydia if: ? You are sexually active and are younger than 77 years of age. ? You are older than 77 years of age and your health care provider tells you that you are at risk for this type of infection. ? Your sexual activity has changed since you were last screened and you are at an increased risk for chlamydia or gonorrhea. Ask your health care provider if you are at risk.  If you do not have HIV, but are at risk, it may be recommended that you take a prescription medicine daily to prevent HIV infection. This is  called pre-exposure prophylaxis (PrEP). You are considered at risk if: ? You are sexually active and do not regularly use condoms or know the HIV status of your partner(s). ? You take drugs by injection. ? You are sexually active with a partner who has HIV.  Talk with your health care provider about whether you are at high risk of being infected with HIV. If you choose to begin PrEP, you should first be tested for HIV. You should then be tested every 3 months for as long as you are taking PrEP. Pregnancy  If you are premenopausal and you may become pregnant, ask your health care provider about preconception counseling.  If you may become pregnant, take 400 to 800 micrograms (mcg) of folic acid every day.  If you want to prevent pregnancy, talk  to your health care provider about birth control (contraception). Osteoporosis and menopause  Osteoporosis is a disease in which the bones lose minerals and strength with aging. This can result in serious bone fractures. Your risk for osteoporosis can be identified using a bone density scan.  If you are 67 years of age or older, or if you are at risk for osteoporosis and fractures, ask your health care provider if you should be screened.  Ask your health care provider whether you should take a calcium or vitamin D supplement to lower your risk for osteoporosis.  Menopause may have certain physical symptoms and risks.  Hormone replacement therapy may reduce some of these symptoms and risks. Talk to your health care provider about whether hormone replacement therapy is right for you. Follow these instructions at home:  Schedule regular health, dental, and eye exams.  Stay current with your immunizations.  Do not use any tobacco products including cigarettes, chewing tobacco, or electronic cigarettes.  If you are pregnant, do not drink alcohol.  If you are breastfeeding, limit how much and how often you drink alcohol.  Limit alcohol intake to  no more than 1 drink per day for nonpregnant women. One drink equals 12 ounces of beer, 5 ounces of wine, or 1 ounces of hard liquor.  Do not use street drugs.  Do not share needles.  Ask your health care provider for help if you need support or information about quitting drugs.  Tell your health care provider if you often feel depressed.  Tell your health care provider if you have ever been abused or do not feel safe at home. This information is not intended to replace advice given to you by your health care provider. Make sure you discuss any questions you have with your health care provider. Document Released: 05/15/2011 Document Revised: 04/06/2016 Document Reviewed: 08/03/2015 Elsevier Interactive Patient Education  [employer].

## 2018-02-19 NOTE — Progress Notes (Addendum)
Subjective:   Angelica [name] is a 77 y.o. female who presents for Medicare Annual (Subsequent) preventive examination.  Reports health as good Presents today with her dtr.   Has son that is a Banker with him some and at thanksgiving  Will be spending every other weekend in the near future  Angelica [name] discussed going to an assisted living. Family in process of determining best plan of care. Highlighted pro and cons of AL vs home care for the long term.  Discussed the best time for admission to AL is  when Angelica [name] can acclimate to her surroundings and staff. Offered counseling for the family or to discuss with the MSW from the [facility]    Diet Lipids 2015; hdl 55; chol 257; trig 165 BMI 24.5  Having a stomach problem and will be seeing a gastric specialist  Neurologist (Dr. Karel Jarvis) following q 6 months. Breakfast; Oatmeal; depends bagel; Tomasa Blase and LT sandwich Lunch; sandwich Supper; stomach issues x 2 months and she has not been able to eat  Dtr providing oversight as needed   Exercise Limited. [facility] but [facility]  Current smoking habit - did not have an opportunity to discuss due to LTC issues.   Health Maintenance Due  Topic Date Due  . TETANUS/TDAP  06/30/1960  . DEXA SCAN  06/30/2006   GYN follows (Dr. Marcelle Overlie) follows Dexa and mammogram   Colonoscopy 05/2008 Is in process of referral to GI for abdominal pain   Tetanus postponed       Objective:     Vitals: BP 100/60   Pulse 64   Ht 5\' 2"  (1.575 m)   Wt 134 lb 6 oz (61 kg)   SpO2 90%   BMI 24.58 kg/m   Body mass index is 24.58 kg/m.  Advanced Directives 01/18/2013 03/26/2012 03/26/2012  Does Patient Have a Medical Advance Directive? Patient has advance directive, copy not in chart Patient has advance directive, copy not in chart Patient does not have advance directive  Type of Advance Directive Living will Healthcare Power of [place];Living will -    Copy of Healthcare Power of Attorney in Chart? Copy requested from family Copy requested from family -  Pre-existing out of facility DNR order (yellow form or pink MOST form) No Yes, notify physician for inpatient order No   In process of completing this with an attorney   Tobacco Social History   Tobacco Use  Smoking Status Current Some Day Smoker  . Packs/day: 1.00  . Types: Cigarettes  Smokeless Tobacco Never Used  Tobacco Comment   less than a pack     Ready to quit: No Counseling given: No Comment: less than a pack Again, 30 minutes used to facilitate discussion of AL vs living with the dtr and son.   Clinical Intake:     Past Medical History:  Diagnosis Date  . Allergic rhinitis   . Blood transfusion   . Chronic back pain    sees [facility]. Odette Fraction at [facility]   . COPD (chronic obstructive pulmonary disease) (HCC)   . Diverticulosis    hx  . Gallstones   . GERD (gastroesophageal reflux disease)   . Hiatal hernia   . HLD (hyperlipidemia)   . HTN (hypertension)   . Irregular cardiac rhythm   . Osteoarthritis   . PUD (peptic ulcer disease)    duodenal 1980s   Past Surgical History:  Procedure Laterality  Date  . CERVICAL LAMINECTOMY  8/07   Dr. Venetia Maxon  . CHOLECYSTECTOMY    . COLONOSCOPY  05/28/2008   per Dr. Leone Payor, diverticulosis, adenomatous polyps, repeat in 3 yrs   . L shoulder surgery  2007   Dr. Teressa Senter  . LUMBAR DISC SURGERY  1986   Family History  Problem Relation Age of Onset  . Cancer - Cervical Daughter    Social History   Socioeconomic History  . Marital status: Married    Spouse name: Not on file  . Number of children: Not on file  . Years of education: Not on file  . Highest education level: Not on file  Occupational History  . Not on file  Social Needs  . Financial resource strain: Not on file  . Food insecurity:    Worry: Not on file    Inability: Not on file  . Transportation needs:    Medical: Not on file     Non-medical: Not on file  Tobacco Use  . Smoking status: Current Some Day Smoker    Packs/day: 1.00    Types: Cigarettes  . Smokeless tobacco: Never Used  . Tobacco comment: less than a pack  Substance and Sexual Activity  . Alcohol use: No    Alcohol/week: 0.0 oz  . Drug use: No  . Sexual activity: Never  Lifestyle  . Physical activity:    Days per week: Not on file    Minutes per session: Not on file  . Stress: Not on file  Relationships  . Social connections:    Talks on phone: Not on file    Gets together: Not on file    Attends religious service: Not on file    Active member of club or organization: Not on file    Attends meetings of clubs or organizations: Not on file    Relationship status: Not on file  Other Topics Concern  . Not on file  Social History Narrative   Married, 3 children (1B / 2G)   Retired Scientist, physiological   Daily caffeine: 2-3 drinks daily     Outpatient Encounter Medications as of 02/19/2018  Medication Sig  . albuterol (PROVENTIL HFA;VENTOLIN HFA) 108 (90 Base) MCG/ACT inhaler Inhale 2 puffs into the lungs every 4 (four) hours as needed for wheezing or shortness of breath.  . alprazolam (XANAX) 2 MG tablet TAKE 1 TABLET BY MOUTH THREE TIMES A DAY  . aspirin 81 MG EC tablet Take 81 mg by mouth daily.    Marland Kitchen atenolol (TENORMIN) 25 MG tablet TAKE 1 TABLET BY MOUTH EVERY DAY  . baclofen (LIORESAL) 10 MG tablet TAKE 1 TABLET BY MOUTH THREE TIMES A DAY AS NEEDED  . beclomethasone (QVAR) 40 MCG/ACT inhaler Inhale 2 puffs into the lungs 2 (two) times daily.  . cetirizine-pseudoephedrine (ZYRTEC-D) 5-120 MG per tablet Take 1 tablet by mouth 2 (two) times daily.  . ciprofloxacin (CIPRO) 500 MG tablet Take 1 tablet (500 mg total) by mouth 2 (two) times daily.  . diclofenac sodium (VOLTAREN) 1 % GEL Apply 1 application topically every 6 (six) hours as needed (knee pain ).  . donepezil (ARICEPT) 10 MG tablet Take 1/2 tablet daily for a month, then increase to 1 tablet  daily  . gabapentin (NEURONTIN) 100 MG capsule TAKE 1 CAPSULE (100 MG TOTAL) BY MOUTH 3 (THREE) TIMES DAILY.  Marland Kitchen ibuprofen (ADVIL,MOTRIN) 800 MG tablet Take 1 tablet (800 mg total) by mouth every 8 (eight) hours as needed.  . metroNIDAZOLE (  FLAGYL) 500 MG tablet Take 1 tablet (500 mg total) by mouth 3 (three) times daily.  . metroNIDAZOLE (METROGEL) 0.75 % vaginal gel INSERT 1 APPLICATORFUL VAGINALLY AT BEDTIME  . [START ON 04/09/2018] Oxycodone HCl 20 MG TABS Take 1 tablet (20 mg total) by mouth every 4 (four) hours as needed (pain).  . polyethylene glycol powder (GLYCOLAX/MIRALAX) powder Take 17 g by mouth daily.  . promethazine (PHENERGAN) 25 MG tablet Take 1 tablet (25 mg total) by mouth every 4 (four) hours as needed for nausea.  . sertraline (ZOLOFT) 100 MG tablet TAKE 1 TABLET (100 MG TOTAL) BY MOUTH DAILY.  Marland Kitchen sulfamethoxazole-trimethoprim (BACTRIM DS,SEPTRA DS) 800-160 MG tablet Take 1 tablet by mouth 2 (two) times daily.  . Vitamin D, Ergocalciferol, (DRISDOL) 50000 units CAPS capsule TAKE ONE CAPSULE BY MOUTH EVERY 7 DAYS OR EVERY WEEK   No facility-administered encounter medications on file as of 02/19/2018.     Activities of Daily Living No flowsheet data found.  Patient Care Team: Nelwyn Salisbury, MD as PCP - General    Assessment:   This is a routine wellness examination for Angelica [name].  Exercise Activities and Dietary recommendations    Goals    . Patient Stated     Long term care planning        Fall Risk Fall Risk  09/10/2017 03/12/2017 07/12/2016 04/07/2015  Falls in the past year? Yes Yes Yes No  Comment - - Emmi Telephone Survey: data to providers prior to load -  Number falls in past yr: 1 2 or more 2 or more -  Comment - - Emmi Telephone Survey Actual Response = 2 -  Injury with Fall? No No No -     Depression Screen PHQ 2/9 Scores 04/07/2015  PHQ - 2 Score 0    Denies depression Mood was agitated somewhat but seemed lucid.  Could state what she wanted and  the reasons regarding her decision which reflected intact judgement.    Cognitive Function MMSE - Mini Mental State Exam 09/10/2017 03/12/2017  Orientation to time 5 4  Orientation to Place 5 5  Registration 3 3  Attention/ Calculation 5 5  Recall 0 0  Language- name 2 objects 2 2  Language- repeat 1 1  Language- follow 3 step command 3 3  Language- read & follow direction 1 1  Write a sentence 1 1  Copy design 0 0  Total score 26 25     Patient request to defer to Neurologist for her apt in May which is appropriate. Declined today.      Immunization History  Administered Date(s) Administered  . Influenza Split 10/04/2012  . Influenza Whole 09/10/2009, 08/12/2010  . Influenza, High Dose Seasonal PF 10/21/2014, 07/19/2016, 10/31/2017  . Influenza,inj,Quad PF,6+ Mos 11/27/2013  . Influenza-Unspecified 09/14/2015  . Pneumococcal Conjugate-13 10/21/2014  . Pneumococcal Polysaccharide-23 07/19/2016      Screening Tests Health Maintenance  Topic Date Due  . TETANUS/TDAP  06/30/1960  . DEXA SCAN  06/30/2006  . INFLUENZA VACCINE  06/13/2018  . PNA vac Low Risk Adult  Completed         Plan:      PCP Notes   Health Maintenance Postponed tetanus  Stated she was following up with Dr. Marcelle Overlie for her Dexa and mammogram  Given resources for long term care.  Agreed to MSW visit by [facility] and agreed to outreach on her behalf.    Educated regarding shingrix   Abnormal Screens Was  alert, able to communicate her wishes today.  Seems to be unresolved issues regarding the long term care plan, with Ms. Kazlauskas stating she would like to go to an AL. States this is based on her taking care of her mother with [facility].  States she does not "want to be a burden to her family."   Offered professional counseling for the family to resolve so a long term plan can be identified. (Dtr stated she feels her mother is doing well and it is to early to consider Assisted living)    Recommended process of community investigation and perhaps family meeting for a conference to evaluate options.  Presented resources to both as well as add'l education for the dtr regarding memory loss.   Referrals  Agreed to MSW from [facility] outreach. Jamelle Rushing(Karen Owen)  Darl PikesSusan will outreach and then let Ms. Hefley and her dtr know if she is available to come and meet with them if the family desires to do so.    Patient concerns; As noted  Nurse Concerns; As noted  Next PCP apt Recent apt with referral to GI for abd pain. The patient noted this         I have personally reviewed and noted the following in the patient's chart:   . Medical and social history . Use of alcohol, tobacco or illicit drugs  . Current medications and supplements . Functional ability and status . Nutritional status . Physical activity . Advanced directives . List of other physicians . Hospitalizations, surgeries, and ER visits in previous 12 months . Vitals . Screenings to include cognitive, depression, and falls . Referrals and appointments  In addition, I have reviewed and discussed with patient certain preventive protocols, quality metrics, and best practice recommendations. A written personalized care plan for preventive services as well as general preventive health recommendations were provided to patient.     Ayron Fillinger, RN  02/19/2018  I have read this note and agree with its contents. Gershon CraneStephen Fry, MD

## 2018-02-25 DIAGNOSIS — N760 Acute vaginitis: Secondary | ICD-10-CM | POA: Diagnosis not present

## 2018-02-25 DIAGNOSIS — N819 Female genital prolapse, unspecified: Secondary | ICD-10-CM | POA: Diagnosis not present

## 2018-03-03 ENCOUNTER — Other Ambulatory Visit: Payer: Self-pay | Admitting: Family Medicine

## 2018-03-04 NOTE — Telephone Encounter (Signed)
Sertraline filled to pharmacy as requested.  Please advise baclofen request.  Last filled: 12/03/17, #90, 2 RF Sig: TAKE 1 TABLET BY MOUTH THREE TIMES A DAY AS NEEDED LOV: 02/15/18

## 2018-03-05 DIAGNOSIS — H2513 Age-related nuclear cataract, bilateral: Secondary | ICD-10-CM | POA: Diagnosis not present

## 2018-03-05 DIAGNOSIS — H40033 Anatomical narrow angle, bilateral: Secondary | ICD-10-CM | POA: Diagnosis not present

## 2018-03-11 ENCOUNTER — Telehealth: Payer: Self-pay | Admitting: Family Medicine

## 2018-03-11 NOTE — Telephone Encounter (Signed)
Spinal stenosis of lumbar region, unspecified whether neurogenic claudication present   Sent to PCP is this the code you would recommend to use?  M48.061

## 2018-03-11 NOTE — Telephone Encounter (Signed)
Patient's daughter Angelica [name] calling to check on status of getting the coding resolved for the refill.

## 2018-03-11 NOTE — Telephone Encounter (Signed)
Copied from CRM [id number]. Topic: General - Other >> Mar 11, 2018  8:58 AM Gerrianne Scale wrote: Reason for CRM: Freida Busman from [id number] [place], [place] - [address] AT [place] [address] [phone] (Phone) [phone] (Fax)     calling for a DX code and since RX Oxycodone HCl 20 MG  is a high dose if he can get a ok to fill RX

## 2018-03-12 NOTE — Telephone Encounter (Signed)
Called the pharmacy. They did receive my VM earlier today. Pharmacist stated that the pt has already picked up the Rx.

## 2018-03-12 NOTE — Telephone Encounter (Signed)
Yes that is the correct diagnosis. They need to fill the rx

## 2018-03-12 NOTE — Telephone Encounter (Signed)
Copied from CRM [id number]. Topic: General - Other >> Mar 12, 2018  8:41 AM Jolayne Haines L wrote: Patient's daughter would like this to be sent today if all possible, she is in a lot of pain

## 2018-03-12 NOTE — Telephone Encounter (Signed)
The pharmacy is close at this time did leave a detailed VM giving them the OK to fill Rx per provider along with the Dx code needed. Will try and call them again later today when the pharmacy is open.  Opens at 9 AM

## 2018-03-15 ENCOUNTER — Ambulatory Visit (INDEPENDENT_AMBULATORY_CARE_PROVIDER_SITE_OTHER): Payer: Medicare HMO | Admitting: Neurology

## 2018-03-15 ENCOUNTER — Encounter: Payer: Self-pay | Admitting: Neurology

## 2018-03-15 VITALS — BP 120/76 | HR 70 | Ht 62.0 in | Wt 136.0 lb

## 2018-03-15 DIAGNOSIS — F0390 Unspecified dementia without behavioral disturbance: Secondary | ICD-10-CM | POA: Diagnosis not present

## 2018-03-15 DIAGNOSIS — F03A0 Unspecified dementia, mild, without behavioral disturbance, psychotic disturbance, mood disturbance, and anxiety: Secondary | ICD-10-CM

## 2018-03-15 NOTE — Patient Instructions (Signed)
1. Continue Donepezil  daily 2. Your daughter should give you the medication, and you should take them right at the time she gives them 3. Start looking into assisted living facilities for higher level of care 4. Follow-up in 6 months, call for any changes  FALL PRECAUTIONS: Be cautious when walking. Scan the area for obstacles that may increase the risk of trips and falls. When getting up in the mornings, sit up at the edge of the bed for a few minutes before getting out of bed. Consider elevating the bed at the head end to avoid drop of blood pressure when getting up. Walk always in a well-lit room (use night lights in the walls). Avoid area rugs or power cords from appliances in the middle of the walkways. Use a walker or a cane if necessary and consider physical therapy for balance exercise. Get your eyesight checked regularly.  FINANCIAL OVERSIGHT: Supervision, especially oversight when making financial decisions or transactions is also recommended.  HOME SAFETY: Consider the safety of the kitchen when operating appliances like stoves, microwave oven, and blender. Consider having supervision and share cooking responsibilities until no longer able to participate in those. Accidents with firearms and other hazards in the house should be identified and addressed as well.  DRIVING: Regarding driving, in patients with progressive memory problems, driving will be impaired. We advise to have someone else do the driving if trouble finding directions or if minor accidents are reported. Independent driving assessment is available to determine safety of driving.  ABILITY TO BE LEFT ALONE: If patient is unable to contact 911 operator, consider using LifeLine, or when the need is there, arrange for someone to stay with patients. Smoking is a fire hazard, consider supervision or cessation. Risk of wandering should be assessed by caregiver and if detected at any point, supervision and safe proof  recommendations should be instituted.  MEDICATION SUPERVISION: Inability to self-administer medication needs to be constantly addressed. Implement a mechanism to ensure safe administration of the medications.  RECOMMENDATIONS FOR ALL PATIENTS WITH MEMORY PROBLEMS: 1. Continue to exercise (Recommend 30 minutes of walking everyday, or 3 hours every week) 2. Increase social interactions - continue going to [place] and enjoy social gatherings with friends and family 3. Eat healthy, avoid fried foods and eat more fruits and vegetables 4. Maintain adequate blood pressure, blood sugar, and blood cholesterol level. Reducing the risk of stroke and cardiovascular disease also helps promoting better memory. 5. Avoid stressful situations. Live a simple life and avoid aggravations. Organize your time and prepare for the next day in anticipation. 6. Sleep well, avoid any interruptions of sleep and avoid any distractions in the bedroom that may interfere with adequate sleep quality 7. Avoid sugar, avoid sweets as there is a strong link between excessive sugar intake, diabetes, and cognitive impairment We discussed the Mediterranean diet, which has been shown to help patients reduce the risk of progressive memory disorders and reduces cardiovascular risk. This includes eating fish, eat fruits and green leafy vegetables, nuts like almonds and hazelnuts, walnuts, and also use olive oil. Avoid fast foods and fried foods as much as possible. Avoid sweets and sugar as sugar use has been linked to worsening of memory function.  There is always a concern of gradual progression of memory problems. If this is the case, then we may need to adjust level of care according to patient needs. Support, both to the patient and caregiver, should then be put into place.

## 2018-03-15 NOTE — Progress Notes (Signed)
NEUROLOGY FOLLOW UP OFFICE NOTE  Angelica [name] [id number] 23-Aug-1941  HISTORY OF PRESENT ILLNESS: I had the pleasure of seeing Angelica [name] in follow-up in the neurology clinic on 03/15/2018.  The patient was last seen 6 months ago for worsening memory and is again accompanied by her daughter who supplement the history today. MMSE in October 2018 was 26/30 (25/30 in April 2018), however with daughter's report of difficulty managing complex tasks such as medications and finances, symptoms suggestive of mild dementia. MRI brain without contrast did not show any acute changes, there was mild diffuse atrophy and mild chronic microvascular disease. She reports some days are better with her memory, short-term memory is where she has a problem. Memory is worse when she gets nervous or upset. She gets very anxious a lot. Her daughter reports that some things are a lot better, but she continues to have difficulties with her medications. She is taking Aricept  daily without side effects. They have a system at home for her pain medications, her son-in-law keeps them in a safe, then puts it daily in a separate safe that only she and her son-in-law have access to. Family gives her the medications and she would write down when she takes it, but her daughter notes that a couple of times a week she forgets to write down she took her medication, or she would write down she took it but her daughter finds it in the container. We discussed assisted living, she states "I don't have a problem with assisted living giving me the medications." She reports that her daughter gets so upset when the patient mentions going to ALF. Her daughter stays with her 24/7. She continues to deal with back pain. No headaches, dizziness, vision changes, focal numbness/tingling/weakness, no falls.   HPI 03/12/2017: This is a 77 yo RH woman with a history of hypertension, hyperlipidemia, COPD, chronic back pain, who presented for evaluation  of worsening memory. Her daughter had a notebook detailing events since February, which I personally reviewed. She did not want to tell this in front of her mother because she felt it would embarrass her. The patient agrees that her memory is not as good as it was, she forgets conversations and names, and started noticing this around 6 months ago. She had previously been living by herself until February when she got very upset looking for her granddaughter and could not be convinced that she was okay. Since then, confusion has been worsening, her daughter has been staying with her. She is taking oxycodone on a daily basis and keeps it in a safe that only she knows the combination, she and her son-in-law have the key. Her daughter reports it is because of the daughter's history of medication abuse, so that nobody can accuse her of getting into the safe. The daughter reports that she would write down when she took her oxycodone, but found that she would sometimes write down she took a pill but did not take it. The daughter may find a pill on the dresser, kitchen table, or somewhere else. When asked about it, she would say she took the medication but it was not working yet. One time she was crying because her pills were not in the safe, her daughter had to give her her own pill. She has let her daughter fill her pillbox with her other medications after she saw that slots were not filled out. She would get confused calling her daughter a different name. She reports being  in charge of bills, her daughter had written down that she asked her daughter why she had no money, and it turned out she had lent money to several family members. She can become argumentative. She stopped driving in 3086 after her husband passed away. She misplaces things, her daughter reports that 2.5 months ago, her daughter bought hamburger and chicken meat. She cleaned the chicken but apparently put the meat in the cabinet and it started to smell.     During her initial visit, she was drowsy and nodded off several times. When asked, they report this is unusual, she did not sleep well the past 2 nights. Her daughter however had written down that she is a night owl and wants to stay up at night. She would want a snack, but 2 hours later still be eating the snack and dozing off, falling asleep with food in her mouth. She would get a little cranky when her daughter tells her to go to bed. She denies any headaches, dizziness, diplopia, dysarthria/dysphagia, focal numbness/tingling/weakness except for numbness on the left lateral thigh. She has chronic pain and takes 3-4 Oxycodone daily instead of every 4 hours. They report counting out her pills. She soiled herself one time. She denies any significant head injuries or alcohol intake. Her mother and nephew were diagnosed with Alzheimer's disease. Her son also came to the visit but states he lives an hour away and has noticed she forgets names and repeats conversations. Her daughter had written down on the journal that there are issues with the son.   Diagnostic Data: I personally reviewed MRI brain without contrast done 04/18/17 which did not show acute changes. There was diffuse volume loss and mild to moderate chronic microvascular disease.  TSH, B12 normal. RPR negative.   PAST MEDICAL HISTORY: Past Medical History:  Diagnosis Date  . Allergic rhinitis   . Blood transfusion   . Chronic back pain    sees Dr. Odette Fraction at [facility]   . COPD (chronic obstructive pulmonary disease) (HCC)   . Diverticulosis    hx  . Gallstones   . GERD (gastroesophageal reflux disease)   . Hiatal hernia   . HLD (hyperlipidemia)   . HTN (hypertension)   . Irregular cardiac rhythm   . Osteoarthritis   . PUD (peptic ulcer disease)    duodenal 1980s    MEDICATIONS: Current Outpatient Medications on File Prior to Visit  Medication Sig Dispense Refill  . albuterol (PROVENTIL HFA;VENTOLIN HFA) 108  (90 Base) MCG/ACT inhaler Inhale 2 puffs into the lungs every 4 (four) hours as needed for wheezing or shortness of breath. 18 Inhaler 0  . alprazolam (XANAX) 2 MG tablet TAKE 1 TABLET BY MOUTH THREE TIMES A DAY 90 tablet 5  . aspirin 81 MG EC tablet Take 81 mg by mouth daily.      Marland Kitchen atenolol (TENORMIN) 25 MG tablet TAKE 1 TABLET BY MOUTH EVERY DAY 30 tablet 5  . baclofen (LIORESAL) 10 MG tablet TAKE 1 TABLET BY MOUTH THREE TIMES A DAY AS NEEDED 90 tablet 5  . beclomethasone (QVAR) 40 MCG/ACT inhaler Inhale 2 puffs into the lungs 2 (two) times daily. 1 Inhaler 11  . cetirizine-pseudoephedrine (ZYRTEC-D) 5-120 MG per tablet Take 1 tablet by mouth 2 (two) times daily.    . diclofenac sodium (VOLTAREN) 1 % GEL Apply 1 application topically every 6 (six) hours as needed (knee pain ). 100 g 11  . donepezil (ARICEPT) 10 MG tablet Take 1/2  tablet daily for a month, then increase to 1 tablet daily 30 tablet 11  . gabapentin (NEURONTIN) 100 MG capsule TAKE 1 CAPSULE (100 MG TOTAL) BY MOUTH 3 (THREE) TIMES DAILY. 90 capsule 1  . ibuprofen (ADVIL,MOTRIN) 800 MG tablet Take 1 tablet (800 mg total) by mouth every 8 (eight) hours as needed. 90 tablet 5  . metroNIDAZOLE (METROGEL) 0.75 % vaginal gel INSERT 1 APPLICATORFUL VAGINALLY AT BEDTIME  0  . [START ON 04/09/2018] Oxycodone HCl 20 MG TABS Take 1 tablet (20 mg total) by mouth every 4 (four) hours as needed (pain). 180 tablet 0  . polyethylene glycol powder (GLYCOLAX/MIRALAX) powder Take 17 g by mouth daily.    . promethazine (PHENERGAN) 25 MG tablet Take 1 tablet (25 mg total) by mouth every 4 (four) hours as needed for nausea. 60 tablet 5  . sertraline (ZOLOFT) 100 MG tablet TAKE 1 TABLET BY MOUTH EVERY DAY 30 tablet 11  . Vitamin D, Ergocalciferol, (DRISDOL) 50000 units CAPS capsule TAKE ONE CAPSULE BY MOUTH EVERY 7 DAYS OR EVERY WEEK 4 capsule 0   No current facility-administered medications on file prior to visit.     ALLERGIES: Allergies  Allergen  Reactions  . Codeine Itching  . Penicillins Hives    FAMILY HISTORY: Family History  Problem Relation Age of Onset  . Cancer - Cervical Daughter     SOCIAL HISTORY: Social History   Socioeconomic History  . Marital status: Married    Spouse name: Not on file  . Number of children: Not on file  . Years of education: Not on file  . Highest education level: Not on file  Occupational History  . Not on file  Social Needs  . Financial resource strain: Not on file  . Food insecurity:    Worry: Not on file    Inability: Not on file  . Transportation needs:    Medical: Not on file    Non-medical: Not on file  Tobacco Use  . Smoking status: Current Some Day Smoker    Packs/day: 1.00    Types: Cigarettes  . Smokeless tobacco: Never Used  . Tobacco comment: less than a pack  Substance and Sexual Activity  . Alcohol use: No    Alcohol/week: 0.0 oz  . Drug use: No  . Sexual activity: Never  Lifestyle  . Physical activity:    Days per week: Not on file    Minutes per session: Not on file  . Stress: Not on file  Relationships  . Social connections:    Talks on phone: Not on file    Gets together: Not on file    Attends religious service: Not on file    Active member of club or organization: Not on file    Attends meetings of clubs or organizations: Not on file    Relationship status: Not on file  . Intimate partner violence:    Fear of current or ex partner: Not on file    Emotionally abused: Not on file    Physically abused: Not on file    Forced sexual activity: Not on file  Other Topics Concern  . Not on file  Social History Narrative   Married, 3 children (1B / 2G)   Retired Scientist, physiological   Daily caffeine: 2-3 drinks daily     REVIEW OF SYSTEMS: Constitutional: No fevers, chills, or sweats, no generalized fatigue, change in appetite Eyes: No visual changes, double vision, eye pain Ear, nose and throat: No hearing  loss, ear pain, nasal congestion, sore  throat Cardiovascular: No chest pain, palpitations Respiratory:  No shortness of breath at rest or with exertion, wheezes GastrointestinaI: No nausea, vomiting, diarrhea, abdominal pain, fecal incontinence Genitourinary:  No dysuria, urinary retention or frequency Musculoskeletal:  + neck pain, back pain Integumentary: No rash, pruritus, skin lesions Neurological: as above Psychiatric: No depression, insomnia, anxiety Endocrine: No palpitations, fatigue, diaphoresis, mood swings, change in appetite, change in weight, increased thirst Hematologic/Lymphatic:  No anemia, purpura, petechiae. Allergic/Immunologic: no itchy/runny eyes, nasal congestion, recent allergic reactions, rashes  PHYSICAL EXAM: Vitals:   03/15/18 1556  BP: 120/76  Pulse: 70  SpO2: 93%   General: No acute distress Head:  Normocephalic/atraumatic Neck: supple, no paraspinal tenderness, full range of motion Heart:  Regular rate and rhythm Lungs:  Clear to auscultation bilaterally Back: No paraspinal tenderness Skin/Extremities: No rash, no edema Neurological Exam: alert and oriented to person, place, month and year (wrong date and day of week). No aphasia or dysarthria. Fund of knowledge is appropriate. 3/3 delayed recall.  Attention and concentration are normal, 3/5 WORLD backward.  Able to name objects and repeat phrases. Cranial nerves: Pupils equal, round, reactive to light. Extraocular movements intact with no nystagmus. Visual fields full. Facial sensation intact. No facial asymmetry. Tongue, uvula, palate midline.  Motor: Bulk and tone normal, muscle strength 5/5 throughout with no pronator drift.  Sensation to light touch intact.  No extinction to double simultaneous stimulation.  Deep tendon reflexes +1 throughout, toes downgoing.  Finger to nose testing intact.  Gait narrow-based with walker, no ataxia.  IMPRESSION: This is a 77 yo RH woman with a history of hypertension, hyperlipidemia, COPD, chronic back  pain, with mild dementia. MMSE in October 2018 26/30. She is on Donepezil  daily. She continues to have issues with managing medications, we discussed higher level of care and assisted living which she is interested in. She states her daughter is the one who gets upset when this is discussed. Resources were given today. We discussed having family directly administer medications instead of giving them in bottles for her to take later on. She expressed agreement. Continue close supervision. She does not drive. She will follow-up in 6 months, she and family know to call for any changes.   Thank you for allowing me to participate in her care.  Please do not hesitate to call for any questions or concerns.  The duration of this appointment visit was 25 minutes of face-to-face time with the patient.  Greater than 50% of this time was spent in counseling, explanation of diagnosis, planning of further management, and coordination of care.   Patrcia Dolly, M.D.   CC: Dr. Clent Ridges

## 2018-03-28 DIAGNOSIS — H2511 Age-related nuclear cataract, right eye: Secondary | ICD-10-CM | POA: Diagnosis not present

## 2018-04-09 ENCOUNTER — Telehealth: Payer: Self-pay | Admitting: Family Medicine

## 2018-04-09 NOTE — Telephone Encounter (Signed)
Script sent e-scribe to pharmacy.

## 2018-04-09 NOTE — Telephone Encounter (Signed)
Copied from CRM [id number]. Topic: Quick Communication - Rx Refill/Question >> Apr 09, 2018  8:11 AM Eston Mould B wrote: Medication: Oxycodone HCl 20 MG TABS Has the patient contacted their pharmacy? yes  (Agent: If no, request that the patient contact the pharmacy for the refill.) (Agent: If yes, when and what did the pharmacy advise?)  Preferred Pharmacy (with phone number or street name):    Agent: Please be advised that RX refills may take up to 3 business days. We ask that you follow-up with your pharmacy.

## 2018-04-10 ENCOUNTER — Other Ambulatory Visit (INDEPENDENT_AMBULATORY_CARE_PROVIDER_SITE_OTHER): Payer: Medicare HMO

## 2018-04-10 ENCOUNTER — Other Ambulatory Visit: Payer: Self-pay

## 2018-04-10 ENCOUNTER — Encounter: Payer: Self-pay | Admitting: Internal Medicine

## 2018-04-10 ENCOUNTER — Telehealth: Payer: Self-pay

## 2018-04-10 ENCOUNTER — Ambulatory Visit: Payer: Medicare HMO | Admitting: Internal Medicine

## 2018-04-10 VITALS — BP 90/54 | HR 68 | Ht 62.0 in | Wt 133.6 lb

## 2018-04-10 DIAGNOSIS — R194 Change in bowel habit: Secondary | ICD-10-CM

## 2018-04-10 DIAGNOSIS — F0390 Unspecified dementia without behavioral disturbance: Secondary | ICD-10-CM

## 2018-04-10 DIAGNOSIS — R10814 Left lower quadrant abdominal tenderness: Secondary | ICD-10-CM

## 2018-04-10 DIAGNOSIS — F03A0 Unspecified dementia, mild, without behavioral disturbance, psychotic disturbance, mood disturbance, and anxiety: Secondary | ICD-10-CM

## 2018-04-10 DIAGNOSIS — R1032 Left lower quadrant pain: Secondary | ICD-10-CM

## 2018-04-10 DIAGNOSIS — Z86010 Personal history of colonic polyps: Secondary | ICD-10-CM

## 2018-04-10 LAB — COMPREHENSIVE METABOLIC PANEL
ALBUMIN: 3.8 g/dL (ref 3.5–5.2)
ALT: 10 U/L (ref 0–35)
AST: 17 U/L (ref 0–37)
Alkaline Phosphatase: 72 U/L (ref 39–117)
BILIRUBIN TOTAL: 0.3 mg/dL (ref 0.2–1.2)
BUN: 12 mg/dL (ref 6–23)
CALCIUM: 9.2 mg/dL (ref 8.4–10.5)
CHLORIDE: 104 meq/L (ref 96–112)
CO2: 31 mEq/L (ref 19–32)
CREATININE: 0.9 mg/dL (ref 0.40–1.20)
GFR: 64.57 mL/min (ref >60.00)
Glucose, Bld: 78 mg/dL (ref 70–99)
Potassium: 4.3 mEq/L (ref 3.5–5.1)
Sodium: 140 mEq/L (ref 135–145)
Total Protein: 7.3 g/dL (ref 6.0–8.3)

## 2018-04-10 LAB — CBC WITH DIFFERENTIAL/PLATELET
BASOS PCT: 0.7 % (ref 0.0–3.0)
Basophils Absolute: 0 10*3/uL (ref 0.0–0.1)
EOS ABS: 0.5 10*3/uL (ref 0.0–0.7)
Eosinophils Relative: 8.2 % — ABNORMAL HIGH (ref 0.0–5.0)
HEMATOCRIT: 36 % (ref 36.0–46.0)
HEMOGLOBIN: 12.2 g/dL (ref 12.0–15.0)
LYMPHS PCT: 33.8 % (ref 12.0–46.0)
Lymphs Abs: 1.9 10*3/uL (ref 0.7–4.0)
MCHC: 33.8 g/dL (ref 30.0–36.0)
MCV: 92.1 fl (ref 78.0–100.0)
Monocytes Absolute: 0.5 10*3/uL (ref 0.1–1.0)
Monocytes Relative: 8.8 % (ref 3.0–12.0)
Neutro Abs: 2.7 10*3/uL (ref 1.4–7.7)
Neutrophils Relative %: 48.5 % (ref 43.0–77.0)
Platelets: 228 10*3/uL (ref 150.0–400.0)
RBC: 3.91 Mil/uL (ref 3.87–5.11)
RDW: 14 % (ref 11.5–15.5)
WBC: 5.5 10*3/uL (ref 4.0–10.5)

## 2018-04-10 MED ORDER — DONEPEZIL HCL 10 MG PO TABS
ORAL_TABLET | ORAL | 5 refills | Status: DC
Start: 2018-04-10 — End: 2020-09-27

## 2018-04-10 NOTE — Progress Notes (Signed)
CONSUELLA SCURLOCK 77 y.o. 1940-12-17 [id number]  Assessment & Plan:   Encounter Diagnoses  Name Primary?  . Left lower quadrant abdominal tenderness without rebound tenderness Yes  . LLQ pain   . Change in bowel habits   . Hx of adenomatous colonic polyps    Because of her problems is not clear.  I think possibilities include partially treated diverticulitis, I think she could be having some side effects from donepezil.  Less likely is colorectal neoplasia.  Diverticulosis without diverticulitis could be a problem.  Plan for work-up is below:  CMET CBC These have returned and are normal  CT abd/pelvis with contrast Colonoscopy eventually once I know she does not have active diverticulitis  I appreciate the opportunity to care for this patient. CC: Nelwyn Salisbury, MD   Subjective:   Chief Complaint: Abdominal pain  HPI The patient is a very nice 77 year old widowed white woman (I cared for her husband who had cholangiocarcinoma) who is having problems off and on with abdominal pain particularly in the left lower quadrant, she ended up seeing Dr. Clent Ridges and was treated with Cipro and metronidazole for 7 to 10 days in April and had a partial relief but then recurrence of symptoms.  Sometimes she has some epigastric discomfort.  The symptoms began in January.  She says donepezil was started for memory disturbance at that time.  Her weight has fluctuated some but there is been no steady downtrend.  Appetite is a bit off.  She has had some indigestion and bloating and gaseousness as well.  No rectal bleeding.  Last colonoscopy was in 2009 and she had 2 diminutive tubular adenomas and sigmoid diverticulosis.  The indication for that colonoscopy were prior clinical signs of diverticulitis.  CT scan abdomen pelvis in 2014 without diverticulitis. Allergies  Allergen Reactions  . Codeine Itching  . Penicillins Hives   Current Meds  Medication Sig  . albuterol (PROVENTIL HFA;VENTOLIN HFA)  108 (90 Base) MCG/ACT inhaler Inhale 2 puffs into the lungs every 4 (four) hours as needed for wheezing or shortness of breath.  . alprazolam (XANAX) 2 MG tablet TAKE 1 TABLET BY MOUTH THREE TIMES A DAY  . aspirin 81 MG EC tablet Take 81 mg by mouth daily.    Marland Kitchen atenolol (TENORMIN) 25 MG tablet TAKE 1 TABLET BY MOUTH EVERY DAY  . baclofen (LIORESAL) 10 MG tablet TAKE 1 TABLET BY MOUTH THREE TIMES A DAY AS NEEDED  . beclomethasone (QVAR) 40 MCG/ACT inhaler Inhale 2 puffs into the lungs 2 (two) times daily.  . cetirizine-pseudoephedrine (ZYRTEC-D) 5-120 MG per tablet Take 1 tablet by mouth 2 (two) times daily.  . diclofenac sodium (VOLTAREN) 1 % GEL Apply 1 application topically every 6 (six) hours as needed (knee pain ).  . donepezil (ARICEPT) 10 MG tablet Take 1 tablet daily  . gabapentin (NEURONTIN) 100 MG capsule TAKE 1 CAPSULE (100 MG TOTAL) BY MOUTH 3 (THREE) TIMES DAILY.  Marland Kitchen ibuprofen (ADVIL,MOTRIN) 800 MG tablet Take 1 tablet (800 mg total) by mouth every 8 (eight) hours as needed.  . Oxycodone HCl 20 MG TABS Take 1 tablet (20 mg total) by mouth every 4 (four) hours as needed (pain).  . polyethylene glycol powder (GLYCOLAX/MIRALAX) powder Take 17 g by mouth daily.  . promethazine (PHENERGAN) 25 MG tablet Take 1 tablet (25 mg total) by mouth every 4 (four) hours as needed for nausea.  . sertraline (ZOLOFT) 100 MG tablet TAKE 1 TABLET BY MOUTH EVERY DAY  Past Medical History:  Diagnosis Date  . Allergic rhinitis   . Blood transfusion   . Chronic back pain    sees Dr. Odette Fraction at [facility]   . COPD (chronic obstructive pulmonary disease) (HCC)   . Diverticulosis    hx  . Gallstones   . GERD (gastroesophageal reflux disease)   . Hiatal hernia   . HLD (hyperlipidemia)   . HTN (hypertension)   . Irregular cardiac rhythm   . Osteoarthritis   . PUD (peptic ulcer disease)    duodenal 1980s   Past Surgical History:  Procedure Laterality Date  . CERVICAL LAMINECTOMY  8/07     Dr. Venetia Maxon  . CHOLECYSTECTOMY    . COLONOSCOPY  05/28/2008   per Dr. Leone Payor, diverticulosis, adenomatous polyps, repeat in 3 yrs   . L shoulder surgery  2007   Dr. Teressa Senter  . LUMBAR DISC SURGERY  1986   Social History   Social History Narrative   Widowed, 3 children (1B / 2G)   Retired Scientist, physiological   Daily caffeine: None no alcohol no tobacco   family history includes Alzheimer's disease in her mother; Cancer - Cervical in her daughter; Heart disease in her father.   Review of Systems Somewhat diffusely positive with headaches depressed mood anxious mood chronic back pain arthritis memory disturbance all other review systems are negative or as per HPI Objective:   Physical Exam  (!) 90/54   Pulse 68   Ht  (1.575 m)   Wt 133 lb 9.6 oz (60.6 kg)   BMI 24.44 kg/m @  General:  Well-developed, well-nourished and in no acute distress Eyes:  anicteric. ENT:   Mouth and posterior pharynx free of lesions.  Neck:   supple w/o thyromegaly or mass.  Lungs: Clear to auscultation bilaterally. Heart:  S1S2, no rubs, murmurs, gallops. Abdomen:  soft, non-tender, no hepatosplenomegaly, hernia, or mass and BS+.  Rectal: Christie Nottingham CMA present  Small anal tags nontender no mass soft brown stool Lymph:  no cervical or supraclavicular adenopathy. Extremities:   no edema, cyanosis or clubbing Skin   no rash. Neuro:  A&O x 3.  Psych:  appropriate mood and  Affect.   Data Reviewed:  See HPI

## 2018-04-10 NOTE — Telephone Encounter (Signed)
Fax from [facility]  Refill the following   Gabapentin 100 MG  Atenolol 25 MG  Baclofen 10 MG  Ibuprofen 800 MG  Sertraline 100 MG    Last OV 10/31/2017   Sent to PCP for approval of ibuprofen 800 MG and Gabapentin

## 2018-04-10 NOTE — Patient Instructions (Signed)
   Your provider has requested that you go to the basement level for lab work before leaving today. Press "B" on the elevator. The lab is located at the first door on the left as you exit the elevator.    You have been scheduled for a CT scan of the abdomen and pelvis at [facility] ([address]---this is in the same building as Press photographer).   You are scheduled on 04/19/18 at 10:45AM. You should arrive 15 minutes prior to your appointment time for registration. Please follow the written instructions below on the day of your exam:  WARNING: IF YOU ARE ALLERGIC TO IODINE/X-RAY DYE, PLEASE NOTIFY RADIOLOGY IMMEDIATELY AT [phone]! YOU WILL BE GIVEN A 13 HOUR PREMEDICATION PREP.  1) Do not eat or drink anything after 6:45AM (4 hours prior to your test) 2) You have been given 2 bottles of oral contrast to drink. The solution may taste better if refrigerated, but do NOT add ice or any other liquid to this solution. Shake well before drinking.    Drink 1 bottle of contrast @ 8:45AM (2 hours prior to your exam)  Drink 1 bottle of contrast @ 9:45AM (1 hour prior to your exam)  You may take any medications as prescribed with a small amount of water except for the following: Metformin, Glucophage, Glucovance, Avandamet, Riomet, Fortamet, Actoplus Met, Janumet, Glumetza or Metaglip. The above medications must be held the day of the exam AND 48 hours after the exam.  The purpose of you drinking the oral contrast is to aid in the visualization of your intestinal tract. The contrast solution may cause some diarrhea. Before your exam is started, you will be given a small amount of fluid to drink. Depending on your individual set of symptoms, you may also receive an intravenous injection of x-ray contrast/dye. Plan on being at [facility] for 30 minutes or longer, depending on the type of exam you are having performed.  This test typically takes 30-45 minutes to complete.  If  you have any questions regarding your exam or if you need to reschedule, you may call the CT department at [phone] between the hours of 8:00 am and 5:00 pm, Monday-Friday.  ________________________________________________________________________  I appreciate the opportunity to care for you. Silvano Rusk, MD, [facility]

## 2018-04-11 MED ORDER — ATENOLOL 25 MG PO TABS: 25.0000 mg | ORAL_TABLET | Freq: Every day | ORAL | 3 refills | Status: DC

## 2018-04-11 MED ORDER — BACLOFEN 10 MG PO TABS: 10.0000 mg | ORAL_TABLET | Freq: Three times a day (TID) | ORAL | 3 refills | Status: DC | PRN

## 2018-04-11 MED ORDER — GABAPENTIN 100 MG PO CAPS: ORAL_CAPSULE | ORAL | 3 refills | Status: DC

## 2018-04-11 MED ORDER — IBUPROFEN 800 MG PO TABS: 800.0000 mg | ORAL_TABLET | Freq: Three times a day (TID) | ORAL | 3 refills | Status: DC | PRN

## 2018-04-11 MED ORDER — OXYCODONE HCL 20 MG PO TABS: 1.0000 | ORAL_TABLET | ORAL | 0 refills | Status: DC | PRN

## 2018-04-11 MED ORDER — SERTRALINE HCL 100 MG PO TABS: 100.0000 mg | ORAL_TABLET | Freq: Every day | ORAL | 3 refills | Status: DC

## 2018-04-11 NOTE — Telephone Encounter (Signed)
Done

## 2018-04-11 NOTE — Telephone Encounter (Signed)
Rx have been sent to [facility]. Called pt to advise that this has been done. Pt voiced understanding.

## 2018-04-11 NOTE — Telephone Encounter (Signed)
Please refill Ibuprofen and Gabapentin for one year

## 2018-04-11 NOTE — Telephone Encounter (Signed)
Pt called in and states that [facility] requires there to be a dx code on all controlled rx's.   Dr. Clent Ridges - Please add dx code and resend to pharmacy - PT IS WAITING AT PHARMACY NOW  THANK YOU!

## 2018-04-15 ENCOUNTER — Other Ambulatory Visit: Payer: Self-pay | Admitting: Family Medicine

## 2018-04-15 NOTE — Telephone Encounter (Signed)
Xanax refill Last Refill:10/25/17 #90 Last OV: 02/15/18 PCP: Dr. Clent RidgesFry Pharmacy:[facility]

## 2018-04-15 NOTE — Telephone Encounter (Signed)
Copied from CRM [id number]. Topic: Quick Communication - Rx Refill/Question >> Apr 15, 2018  4:11 PM Arlyss Gandyichardson, Chyla Schlender N, NT wrote: Medication: alprazolam [name](XANAX) 2 MG tablet   Has the patient contacted their pharmacy? Yes.   (Agent: If no, request that the patient contact the pharmacy for the refill.) (Agent: If yes, when and what did the pharmacy advise?)  Preferred Pharmacy (with phone number or street name): [facility] [place] - [id number] [phone] (Phone) [phone])      Agent: Please be advised that RX refills may take up to 3 business days. We ask that you follow-up with your pharmacy.

## 2018-04-16 NOTE — Telephone Encounter (Signed)
Call in #[phone] with 5 rf 

## 2018-04-16 NOTE — Telephone Encounter (Signed)
Sent to PCP for approval.  

## 2018-04-17 MED ORDER — ALPRAZOLAM 2 MG PO TABS: 2.0000 mg | ORAL_TABLET | Freq: Three times a day (TID) | ORAL | 5 refills | Status: DC

## 2018-04-19 ENCOUNTER — Ambulatory Visit (INDEPENDENT_AMBULATORY_CARE_PROVIDER_SITE_OTHER)
Admission: RE | Admit: 2018-04-19 | Discharge: 2018-04-19 | Disposition: A | Discharge status: Home / Self Care | Payer: Medicare HMO | Source: Ambulatory Visit | Attending: Internal Medicine | Admitting: Internal Medicine

## 2018-04-19 DIAGNOSIS — R10814 Left lower quadrant abdominal tenderness: Secondary | ICD-10-CM | POA: Diagnosis not present

## 2018-04-19 DIAGNOSIS — R194 Change in bowel habit: Secondary | ICD-10-CM

## 2018-04-19 DIAGNOSIS — R1032 Left lower quadrant pain: Secondary | ICD-10-CM | POA: Diagnosis not present

## 2018-04-19 DIAGNOSIS — R197 Diarrhea, unspecified: Secondary | ICD-10-CM | POA: Diagnosis not present

## 2018-04-19 MED ORDER — IOPAMIDOL (ISOVUE-300) INJECTION 61%
100.0000 mL | Freq: Once | INTRAVENOUS | Status: AC | PRN
  Administered 2018-04-19: 100 mL via INTRAVENOUS

## 2018-04-19 NOTE — Progress Notes (Signed)
No diverticulitis - she does appear to have a cystocele - bladder is dropped - can cause pain, etc - did she know this? We were planning to schedule a colonoscopy next If she is still ok with that please do so  Also how is she ? Symptom update and does she think she needs medications to help sxs while we wait for colonoscopy

## 2018-04-22 DIAGNOSIS — N8182 Incompetence or weakening of pubocervical tissue: Secondary | ICD-10-CM | POA: Diagnosis not present

## 2018-04-25 ENCOUNTER — Telehealth: Payer: Self-pay | Admitting: *Deleted

## 2018-04-25 MED ORDER — VENTOLIN HFA 108 (90 BASE) MCG/ACT IN AERS: 2.0000 | INHALATION_SPRAY | RESPIRATORY_TRACT | 3 refills | Status: DC | PRN

## 2018-04-25 NOTE — Telephone Encounter (Signed)
Dr Clent RidgesFry received a fax from [facility] stating Albuterol Sul HFA 90mcg was denied.  Per Dr Clent RidgesFry brand Ventolin HFA and was sent to the pts pharmacy and I called the pt and informed her of this.

## 2018-05-01 ENCOUNTER — Other Ambulatory Visit: Payer: Self-pay | Admitting: Family Medicine

## 2018-05-06 ENCOUNTER — Encounter: Payer: Self-pay | Admitting: Family Medicine

## 2018-05-06 ENCOUNTER — Ambulatory Visit (INDEPENDENT_AMBULATORY_CARE_PROVIDER_SITE_OTHER): Payer: Medicare HMO | Admitting: Family Medicine

## 2018-05-06 VITALS — BP 108/52 | HR 66 | Temp 97.9°F | Ht 62.0 in | Wt 128.2 lb

## 2018-05-06 DIAGNOSIS — M48061 Spinal stenosis, lumbar region without neurogenic claudication: Secondary | ICD-10-CM

## 2018-05-06 DIAGNOSIS — F1190 Opioid use, unspecified, uncomplicated: Secondary | ICD-10-CM | POA: Diagnosis not present

## 2018-05-06 MED ORDER — OXYCODONE HCL 20 MG PO TABS: 1.0000 | ORAL_TABLET | ORAL | 0 refills | Status: DC | PRN

## 2018-05-06 MED ORDER — PROMETHAZINE HCL 25 MG PO TABS: 25.0000 mg | ORAL_TABLET | ORAL | 3 refills | Status: AC | PRN

## 2018-05-06 MED ORDER — OXYCODONE HCL 20 MG PO TABS
1.0000 | ORAL_TABLET | ORAL | 0 refills | Status: DC | PRN
Start: 2018-06-10 — End: 2018-05-06

## 2018-05-06 NOTE — Progress Notes (Signed)
   Subjective:    Patient ID: Angelica [name], female    DOB: 21-Aug-1941, 77 y.o.   MRN: [id number]  HPI Here for pain management. She is doing well.  Indication for chronic opioid: low back pain  Medication and dose: Oxycodone 20 mg  # pills per month: 180  Last UDS date: 10-31-17 Opioid Treatment Agreement signed (Y/N): 02-05-18 Opioid Treatment Agreement last reviewed with patient:  05-06-18 NCCSRS reviewed this encounter (include red flags):  05-06-18     Review of Systems  Constitutional: Negative.   Respiratory: Negative.   Cardiovascular: Negative.   Musculoskeletal: Positive for back pain.  Neurological: Negative.        Objective:   Physical Exam  Constitutional: She is oriented to person, place, and time. She appears well-developed and well-nourished.  Cardiovascular: Normal rate, regular rhythm, normal heart sounds and intact distal pulses.  Pulmonary/Chest: Effort normal and breath sounds normal.  Neurological: She is alert and oriented to person, place, and time.          Assessment & Plan:  Pain management. Meds were refilled.  Gershon CraneStephen Adie Vilar, MD

## 2018-05-10 ENCOUNTER — Telehealth: Payer: Self-pay | Admitting: *Deleted

## 2018-05-10 NOTE — Telephone Encounter (Signed)
Prior auth for Promethazine 25mg  sent to [email or web address].

## 2018-06-09 DIAGNOSIS — H2512 Age-related nuclear cataract, left eye: Secondary | ICD-10-CM | POA: Diagnosis not present

## 2018-06-10 ENCOUNTER — Ambulatory Visit (AMBULATORY_SURGERY_CENTER): Payer: Self-pay

## 2018-06-10 VITALS — Ht 62.0 in | Wt 130.4 lb

## 2018-06-10 DIAGNOSIS — R1032 Left lower quadrant pain: Secondary | ICD-10-CM

## 2018-06-10 NOTE — Progress Notes (Signed)
Denies allergies to eggs or soy products. Denies complication of anesthesia or sedation. Denies use of weight loss medication. Denies use of O2.   Emmi instructions declined.  

## 2018-06-13 DIAGNOSIS — H2512 Age-related nuclear cataract, left eye: Secondary | ICD-10-CM | POA: Diagnosis not present

## 2018-06-14 ENCOUNTER — Encounter: Payer: Self-pay | Admitting: Internal Medicine

## 2018-06-17 ENCOUNTER — Other Ambulatory Visit: Payer: Self-pay | Admitting: Family Medicine

## 2018-06-17 NOTE — Telephone Encounter (Signed)
Medication filled on 06/17/18 

## 2018-06-17 NOTE — Telephone Encounter (Signed)
Copied from CRM [id number]. Topic: Quick Communication - Rx Refill/Question >> Jun 17, 2018  1:31 PM Gerrianne ScalePayne, Greggory Safranek L wrote: Medication: gabapentin (NEURONTIN) 100 MG capsule  Pt is out of this medicine  Has the patient contacted their pharmacy? Yes.   (Agent: If no, request that the patient contact the pharmacy for the refill.) (Agent: If yes, when and what did the pharmacy advise?) they was going to fax over request today  Preferred Pharmacy (with phone number or street name): [id number], [place] - [address] [phone] (Phone) [phone])      Agent: Please be advised that RX refills may take up to 3 business days. We ask that you follow-up with your pharmacy.

## 2018-06-25 ENCOUNTER — Ambulatory Visit (AMBULATORY_SURGERY_CENTER): Payer: Medicare HMO | Admitting: Internal Medicine

## 2018-06-25 ENCOUNTER — Encounter: Payer: Self-pay | Admitting: Internal Medicine

## 2018-06-25 VITALS — BP 165/76 | HR 62 | Temp 98.7°F | Resp 13 | Ht 62.0 in | Wt 128.0 lb

## 2018-06-25 DIAGNOSIS — K5730 Diverticulosis of large intestine without perforation or abscess without bleeding: Secondary | ICD-10-CM | POA: Diagnosis not present

## 2018-06-25 DIAGNOSIS — R109 Unspecified abdominal pain: Secondary | ICD-10-CM | POA: Diagnosis not present

## 2018-06-25 DIAGNOSIS — J449 Chronic obstructive pulmonary disease, unspecified: Secondary | ICD-10-CM | POA: Diagnosis not present

## 2018-06-25 DIAGNOSIS — I10 Essential (primary) hypertension: Secondary | ICD-10-CM | POA: Diagnosis not present

## 2018-06-25 DIAGNOSIS — R194 Change in bowel habit: Secondary | ICD-10-CM | POA: Diagnosis not present

## 2018-06-25 DIAGNOSIS — Z86010 Personal history of colonic polyps: Secondary | ICD-10-CM | POA: Diagnosis not present

## 2018-06-25 HISTORY — PX: COLONOSCOPY: SHX174

## 2018-06-25 MED ORDER — SODIUM CHLORIDE 0.9 % IV SOLN: 500.0000 mL | Freq: Once | INTRAVENOUS | Status: DC

## 2018-06-25 NOTE — Op Note (Signed)
[facility] Patient Name: Angelica [name] Procedure Date: 06/25/2018 2:13 PM MRN: [id number] Endoscopist: Iva Boop , MD Age: 77 Referring MD:  Date of Birth: [birth year] Gender: Female Account #: [id number] Procedure:                Colonoscopy Indications:              Change in bowel habits Medicines:                Propofol per Anesthesia, Monitored Anesthesia Care Procedure:                Pre-Anesthesia Assessment:                           - Prior to the procedure, a History and Physical                            was performed, and patient medications and                            allergies were reviewed. The patient's tolerance of                            previous anesthesia was also reviewed. The risks                            and benefits of the procedure and the sedation                            options and risks were discussed with the patient.                            All questions were answered, and informed consent                            was obtained. Prior Anticoagulants: The patient has                            taken no previous anticoagulant or antiplatelet                            agents. ASA Grade Assessment: III - A patient with                            severe systemic disease. After reviewing the risks                            and benefits, the patient was deemed in                            satisfactory condition to undergo the procedure.                           After obtaining informed consent, the colonoscope  was passed under direct vision. Throughout the                            procedure, the patient's blood pressure, pulse, and                            oxygen saturations were monitored continuously. The                            Model PCF-[id number]) scope was introduced                            through the anus and advanced to the the cecum,                            identified  by appendiceal orifice and ileocecal                            valve. The colonoscopy was performed without                            difficulty. The patient tolerated the procedure                            well. The quality of the bowel preparation was                            good. The ileocecal valve, appendiceal orifice, and                            rectum were photographed. The bowel preparation                            used was Miralax. Scope In: 2:16:59 PM Scope Out: 2:25:26 PM Scope Withdrawal Time: 0 hours 6 minutes 21 seconds  Total Procedure Duration: 0 hours 8 minutes 27 seconds  Findings:                 The perianal and digital rectal examinations were                            normal.                           Multiple diverticula were found in the sigmoid                            colon.                           Scattered diverticula were found in the right colon.                           The exam was otherwise without abnormality on  direct and retroflexion views. Complications:            No immediate complications. Estimated Blood Loss:     Estimated blood loss: none. Impression:               - Diverticulosis in the sigmoid colon.                           - Diverticulosis in the right colon.                           - The examination was otherwise normal on direct                            and retroflexion views.                           - No specimens collected. Recommendation:           - Patient has a contact number available for                            emergencies. The signs and symptoms of potential                            delayed complications were discussed with the                            patient. Return to normal activities tomorrow.                            Written discharge instructions were provided to the                            patient.                           - Resume previous diet.                            - Continue present medications.                           - No repeat colonoscopy due to current age (68                            years or older) and the absence of colonic polyps.                           Imodium AD as needed - would not use                            antichoicholinergics as she is on donepezil -                            (which could be driving some of the symptoms she  had) Iva Booparl E [name], MD 06/25/2018 2:33:39 PM This report has been signed electronically.

## 2018-06-25 NOTE — Progress Notes (Signed)
Report given to PACU, vss 

## 2018-06-25 NOTE — Patient Instructions (Addendum)
No polyps, cancer or inflammation seen.  I hope you stay well - if you get diarrhea problems may try Imodium AD.  It could be the donepezil medication causing side effects also - may discuss with Dr. [place] LionsAcquino.  No more routine colonoscopy needed - see me as needed.  I appreciate the opportunity to care for you. Iva Booparl E. Khali Albanese, MD, FACG   YOU HAD AN ENDOSCOPIC PROCEDURE TODAY AT THE [facility]:   Refer to the procedure report that was given to you for any specific questions about what was found during the examination.  If the procedure report does not answer your questions, please call your gastroenterologist to clarify.  If you requested that your care partner not be given the details of your procedure findings, then the procedure report has been included in a sealed envelope for you to review at your convenience later.  YOU SHOULD EXPECT: Some feelings of bloating in the abdomen. Passage of more gas than usual.  Walking can help get rid of the air that was put into your GI tract during the procedure and reduce the bloating. If you had a lower endoscopy (such as a colonoscopy or flexible sigmoidoscopy) you may notice spotting of blood in your stool or on the toilet paper. If you underwent a bowel prep for your procedure, you may not have a normal bowel movement for a few days.  Please Note:  You might notice some irritation and congestion in your nose or some drainage.  This is from the oxygen used during your procedure.  There is no need for concern and it should clear up in a day or so.  SYMPTOMS TO REPORT IMMEDIATELY:   Following lower endoscopy (colonoscopy or flexible sigmoidoscopy):  Excessive amounts of blood in the stool  Significant tenderness or worsening of abdominal pains  Swelling of the abdomen that is new, acute  Fever of 100F or higher  Please see handouts given to you on Diverticulosis.  For urgent or emergent issues, a gastroenterologist can be  reached at any hour by calling [phone].   DIET:  We do recommend a small meal at first, but then you may proceed to your regular diet.  Drink plenty of fluids but you should avoid alcoholic beverages for 24 hours.  ACTIVITY:  You should plan to take it easy for the rest of today and you should NOT DRIVE or use heavy machinery until tomorrow (because of the sedation medicines used during the test).    FOLLOW UP: Our staff will call the number listed on your records the next business day following your procedure to check on you and address any questions or concerns that you may have regarding the information given to you following your procedure. If we do not reach you, we will leave a message.  However, if you are feeling well and you are not experiencing any problems, there is no need to return our call.  We will assume that you have returned to your regular daily activities without incident.  If any biopsies were taken you will be contacted by phone or by letter within the next 1-3 weeks.  Please call us at [phone] if you have not heard about the biopsies in 3 weeks.    SIGNATURES/CONFIDENTIALITY: You and/or your care partner have signed paperwork which will be entered into your electronic medical record.  These signatures attest to the fact that that the information above on your After Visit Summary has been reviewed  and is understood.  Full responsibility of the confidentiality of this discharge information lies with you and/or your care-partner.   Thank you for letting us take care of your healthcare needs today.

## 2018-06-25 NOTE — Progress Notes (Signed)
Pt's states no medical or surgical changes since previsit or office visit. 

## 2018-06-26 ENCOUNTER — Telehealth: Payer: Self-pay | Admitting: *Deleted

## 2018-06-26 NOTE — Telephone Encounter (Signed)
First follow up call attempt.  Voicemail with name identifier.  Message left to call if any questions or concerns. 

## 2018-08-07 ENCOUNTER — Ambulatory Visit: Payer: Medicare HMO | Admitting: Family Medicine

## 2018-08-07 ENCOUNTER — Telehealth: Payer: Self-pay | Admitting: Family Medicine

## 2018-08-07 MED ORDER — OXYCODONE HCL 20 MG PO TABS: 1.0000 | ORAL_TABLET | ORAL | 0 refills | Status: DC | PRN

## 2018-08-07 NOTE — Telephone Encounter (Signed)
She is given a single month of medication because she is currently visiting family in [place]. She will be here for a PMV in 2-3 weeks

## 2018-08-14 ENCOUNTER — Encounter: Payer: Self-pay | Admitting: Neurology

## 2018-08-15 ENCOUNTER — Encounter: Payer: Self-pay | Admitting: Family Medicine

## 2018-08-15 ENCOUNTER — Ambulatory Visit (INDEPENDENT_AMBULATORY_CARE_PROVIDER_SITE_OTHER): Payer: Medicare HMO | Admitting: Family Medicine

## 2018-08-15 VITALS — BP 108/62 | HR 56 | Temp 98.2°F | Wt 124.0 lb

## 2018-08-15 DIAGNOSIS — F1190 Opioid use, unspecified, uncomplicated: Secondary | ICD-10-CM

## 2018-08-15 DIAGNOSIS — M48061 Spinal stenosis, lumbar region without neurogenic claudication: Secondary | ICD-10-CM | POA: Diagnosis not present

## 2018-08-15 MED ORDER — OXYCODONE HCL 20 MG PO TABS: 1.0000 | ORAL_TABLET | ORAL | 0 refills | Status: DC | PRN

## 2018-08-15 NOTE — Progress Notes (Signed)
   Subjective:    Patient ID: Angelica [name], female    DOB: [birth year], 77 y.o.   MRN: [id number]  HPI Here with her son for pain management. Her back pain has been well controlled. She also asks my opinion about her living conditions. For the past 4 months she has been living with her son in [place] while her house was being repaired. Her house will become available again next week and the question is whether she is able to stay there by herself safely, as long as some family member checks on her daily. Her son feels she is able to do so (as does Teria Khachatryan), but her daughter disagrees.  Indication for chronic opioid: low back pain  Medication and dose: Oxycodone 20 mg  # pills per month: 180 Last UDS date: 10-31-17 Opioid Treatment Agreement signed (Y/N): 02-05-18 Opioid Treatment Agreement last reviewed with patient:  08-15-18 NCCSRS reviewed this encounter (include red flags):  08-15-18    Review of Systems  Constitutional: Negative.   Respiratory: Negative.   Cardiovascular: Negative.   Musculoskeletal: Positive for back pain.  Neurological: Negative.        Objective:   Physical Exam  Constitutional: She is oriented to person, place, and time. She appears well-developed and well-nourished.  Cardiovascular: Normal rate, regular rhythm, normal heart sounds and intact distal pulses.  Pulmonary/Chest: Effort normal and breath sounds normal.  Neurological: She is alert and oriented to person, place, and time.          Assessment & Plan:  Pain management, meds were refilled. As to whether she can go back to her house alone, I think she probably could but that would be her decision to make.  Gershon Crane, MD

## 2018-08-30 ENCOUNTER — Other Ambulatory Visit: Payer: Self-pay | Admitting: Family Medicine

## 2018-08-30 NOTE — Telephone Encounter (Signed)
Requested medication (s) are due for refill today: yes  Requested medication (s) are on the active medication list: yes  Last refill:  04/27/18 #90 with 5 refills  Future visit scheduled: yes  09/09/18  Notes to clinic:      Requested Prescriptions  Pending Prescriptions Disp Refills   alprazolam (XANAX) 2 MG tablet 90 tablet 5    Sig: Take 1 tablet (2 mg total) by mouth 3 (three) times daily.     Not Delegated - Psychiatry:  Anxiolytics/Hypnotics Failed - 08/30/2018  1:21 PM      Failed - This refill cannot be delegated      Failed - Urine Drug Screen completed in last 360 days.      Passed - Valid encounter within last 6 months    Recent Outpatient Visits          2 weeks ago Chronic narcotic use   [facility], Tera Mater, MD   3 months ago Chronic narcotic use   [facility], Tera Mater, MD   6 months ago Diverticulitis of large intestine without perforation or abscess without bleeding   [facility], Tera Mater, MD   6 months ago Chronic narcotic use   [facility], Tera Mater, MD   10 months ago Narcotic drug use   [facility], Tera Mater, MD      Future Appointments            In 1 week Nelwyn Salisbury, MD [facility] at [place], [place]   In 5 months Montine Circle, Nurse, learning disability at [place], [facility]

## 2018-08-30 NOTE — Telephone Encounter (Signed)
Copied from CRM [id number]. Topic: Quick Communication - Rx Refill/Question >> Aug 30, 2018  1:14 PM Gean Birchwood R wrote: Medication: alprazolam [name]) 2 MG tablet  Has the patient contacted their pharmacy? Yes  Preferred Pharmacy (with phone number or street name): [id number] [place], [place] - [address] AT [phone] (Phone) [phone] (Fax)    Agent: Please be advised that RX refills may take up to 3 business days. We ask that you follow-up with your pharmacy.

## 2018-08-30 NOTE — Telephone Encounter (Signed)
Dr. Fry please advise of refill. Thanks 

## 2018-09-09 ENCOUNTER — Ambulatory Visit: Payer: Medicare HMO | Admitting: Family Medicine

## 2018-09-12 ENCOUNTER — Ambulatory Visit: Payer: Medicare HMO | Admitting: Family Medicine

## 2018-09-12 DIAGNOSIS — Z0289 Encounter for other administrative examinations: Secondary | ICD-10-CM

## 2018-09-19 ENCOUNTER — Ambulatory Visit: Payer: Medicare HMO | Admitting: Family Medicine

## 2018-09-20 ENCOUNTER — Ambulatory Visit (INDEPENDENT_AMBULATORY_CARE_PROVIDER_SITE_OTHER): Payer: Medicare HMO | Admitting: Family Medicine

## 2018-09-20 ENCOUNTER — Encounter: Payer: Self-pay | Admitting: Family Medicine

## 2018-09-20 VITALS — BP 120/76 | HR 77 | Temp 98.0°F | Wt 122.1 lb

## 2018-09-20 DIAGNOSIS — K5900 Constipation, unspecified: Secondary | ICD-10-CM

## 2018-09-20 NOTE — Progress Notes (Signed)
   Subjective:    Patient ID: Angelica [name], female    DOB: December 27, 1940, 77 y.o.   MRN: [id number]  HPI Here to discuss constipation and occasional abdominal pains. No nausea or fever. She takes Miralax off and on, but she tends to stay constipated. This is not surprising given she takes chronic narcotic medications.    Review of Systems  Constitutional: Negative.   Respiratory: Negative.   Cardiovascular: Negative.   Gastrointestinal: Positive for abdominal distention and abdominal pain. Negative for anal bleeding, blood in stool, constipation, diarrhea, nausea, rectal pain and vomiting.  Neurological: Negative.        Objective:   Physical Exam  Constitutional: She appears well-developed and well-nourished.  Cardiovascular: Normal rate, regular rhythm, normal heart sounds and intact distal pulses.  Pulmonary/Chest: Effort normal and breath sounds normal.  Abdominal: Soft. Bowel sounds are normal. She exhibits no distension and no mass. There is no tenderness. There is no rebound and no guarding.          Assessment & Plan:  Mild constipation. I suggested she take the Miralax daily. Recheck prn. Gershon Crane, MD

## 2018-10-01 ENCOUNTER — Telehealth: Payer: Self-pay | Admitting: Neurology

## 2018-10-01 NOTE — Telephone Encounter (Signed)
Patient's daughter Angelica [name] called and would like to speak with you regarding her mother. She said her mom has been staying with her brother and she is wanting to go home and he is going to take her. Several things have happened to where Jolie feels she should not stay by herself. She said that they live an hour away from her. She has an appointment for this Friday and she is wanting to know how it will go? She wants this to be confidential. Thanks

## 2018-10-01 NOTE — Telephone Encounter (Signed)
Spoke with pt's daughter, Vivia EwingJolie, who states that pt has been living with her son (pt's son, not Jolie's) for the past 5 months or so.  Jolie states that she is unsure if pt's son is managing her medications.  Also states that pt is less active since moving in with her son and is losing weight.  Down to 118#.  Jolie goes on to states that pt's home is almost completed (there was roof damage) and pt wishes to return to her home, alone.  States that pt has mentioned that she found an assisted living facility that she wishes to move to, and that will stay at her home "just a few days" but Vivia EwingJolie is concerned that once at her home, pt will refuse to leave.  Jolie states that Dr. Karel JarvisAquino advised against pt living alone (noted in LOV) but pt's son stated that pt's PCP said that she could so long as someone checked on her periodically (this is noted in Dr. Claris CheFry's 08/15/18 note).  Jolie asked what we can do to ensure that pt does not go home.  I advised that from a legal standpoint - there is nothing we can do to enforce this.  We can make recommendations but we cannot force anyone to do anything, unless their welfare is in jeopardy.  Jolie expressed understanding.  Jolie states that she would like to be kept informed of pt's visits and if pt cancels appointments.  Jolie also wishes that this phone conversation not be mentioned to pt during appointment on 10/04/18.

## 2018-10-04 ENCOUNTER — Ambulatory Visit: Payer: Medicare HMO | Admitting: Neurology

## 2018-10-09 ENCOUNTER — Ambulatory Visit: Payer: Medicare HMO | Admitting: Neurology

## 2018-10-09 ENCOUNTER — Other Ambulatory Visit: Payer: Self-pay | Admitting: *Deleted

## 2018-10-09 MED ORDER — ALPRAZOLAM 2 MG PO TABS: 2.0000 mg | ORAL_TABLET | Freq: Three times a day (TID) | ORAL | 5 refills | Status: DC

## 2018-10-09 NOTE — Telephone Encounter (Signed)
Dr. Fry please advise on refill of medication.  Thanks  

## 2018-10-09 NOTE — Telephone Encounter (Signed)
Call in #[phone] with 5 rf 

## 2018-10-15 ENCOUNTER — Telehealth: Payer: Self-pay

## 2018-10-15 NOTE — Telephone Encounter (Signed)
Copied from CRM [id number]. Topic: General - Other >> Oct 15, 2018 11:56 AM Marylen PontoMcneil, Ja-Kwan wrote: Reason for CRM: Aquilah with [employer] called to check status of refill request on alprazolam [name](XANAX) 2 MG tablet that was sent last week. Aquilah requests that the Rx be re-sent.

## 2018-10-15 NOTE — Telephone Encounter (Signed)
Dr. Clent RidgesFry please advise if the [facility] the [facility]

## 2018-10-16 MED ORDER — ALPRAZOLAM 2 MG PO TABS: 2.0000 mg | ORAL_TABLET | Freq: Three times a day (TID) | ORAL | 1 refills | Status: DC

## 2018-10-16 NOTE — Telephone Encounter (Signed)
Done

## 2018-11-19 ENCOUNTER — Telehealth: Payer: Self-pay | Admitting: Family Medicine

## 2018-11-19 NOTE — Telephone Encounter (Signed)
Copied from CRM [id number]. Topic: Quick Communication - See Telephone Encounter >> Nov 19, 2018  9:11 AM Arlyss Gandy, NT wrote: CRM for notification. See Telephone encounter for: 11/19/18. Pts friend, Brennan Bailey (pts POA) calling to request a handicap placard for this pt. CB#: [phone] or [phone]. If can be, Corrie Dandy would like this faxed to [phone]. To attention to [facility]. Corrie Dandy will be faxing over her POA form to the office. 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 Damaris Hippo and Pecola Leisure will be on the form.

## 2018-11-19 NOTE — Telephone Encounter (Signed)
Dr. Clent Ridges please advise on placard.  Thanks

## 2018-11-20 NOTE — Telephone Encounter (Signed)
Placard has been faxed to the number requested.

## 2018-11-20 NOTE — Telephone Encounter (Signed)
This is ready to fax  

## 2018-11-25 ENCOUNTER — Telehealth: Payer: Self-pay | Admitting: *Deleted

## 2018-11-25 NOTE — Telephone Encounter (Signed)
Copied from CRM [id number]. Topic: General - Other >> Nov 25, 2018  1:06 PM Gerrianne Scale wrote: Reason for CRM: pt calling to speak with Dr Carver Fila assistant about her appt tomorrow   I called and spoke with pt and she stated that she has been so busy for the last couple of months and she wanted to find out when she needed to come in for her PMV appt.  Tomorrow is too early so she will call tomorrow and reschedule.

## 2018-11-26 ENCOUNTER — Ambulatory Visit: Payer: Medicare HMO | Admitting: Family Medicine

## 2018-11-27 ENCOUNTER — Telehealth: Payer: Self-pay | Admitting: Family Medicine

## 2018-11-27 NOTE — Telephone Encounter (Signed)
Copied from CRM [id number]. Topic: Quick Communication - Rx Refill/Question >> Nov 27, 2018 12:38 PM Jilda Roche wrote: Medication: Oxycodone HCl 20 MG TABS   Has the patient contacted their pharmacy? Yes.   (Agent: If no, request that the patient contact the pharmacy for the refill.) (Agent: If yes, when and what did the pharmacy advise?)  Preferred Pharmacy (with phone number or street name): [id number] [place], [place] - [address] [phone] (Phone) [phone] (Fax)    Agent: Please be advised that RX refills may take up to 3 business days. We ask that you follow-up with your pharmacy.

## 2018-11-27 NOTE — Telephone Encounter (Signed)
Medication not delegated for NT to refill. 

## 2018-11-28 NOTE — Telephone Encounter (Signed)
This must be during a PMV. She "no showed" to her last appt

## 2018-11-28 NOTE — Telephone Encounter (Signed)
I have called and lmom x 1 for the pt to make her aware to call for her PMV visit.  Advised the pt to call back for this appt.

## 2018-11-29 ENCOUNTER — Ambulatory Visit (INDEPENDENT_AMBULATORY_CARE_PROVIDER_SITE_OTHER): Payer: Medicare HMO | Admitting: Family Medicine

## 2018-11-29 ENCOUNTER — Encounter: Payer: Self-pay | Admitting: Family Medicine

## 2018-11-29 VITALS — BP 106/62 | HR 60 | Temp 98.7°F | Wt 122.0 lb

## 2018-11-29 DIAGNOSIS — F1120 Opioid dependence, uncomplicated: Secondary | ICD-10-CM | POA: Diagnosis not present

## 2018-11-29 DIAGNOSIS — M48061 Spinal stenosis, lumbar region without neurogenic claudication: Secondary | ICD-10-CM | POA: Diagnosis not present

## 2018-11-29 DIAGNOSIS — F1190 Opioid use, unspecified, uncomplicated: Secondary | ICD-10-CM

## 2018-11-29 DIAGNOSIS — M1991 Primary osteoarthritis, unspecified site: Secondary | ICD-10-CM | POA: Diagnosis not present

## 2018-11-29 MED ORDER — OXYCODONE HCL 20 MG PO TABS: 1.0000 | ORAL_TABLET | ORAL | 0 refills | Status: AC | PRN

## 2018-11-29 MED ORDER — DICLOFENAC SODIUM 1 % TD GEL: 1.0000 "application " | Freq: Four times a day (QID) | TRANSDERMAL | 11 refills | Status: AC | PRN

## 2018-11-29 MED ORDER — OXYCODONE HCL 20 MG PO TABS: 1.0000 | ORAL_TABLET | ORAL | 0 refills | Status: DC | PRN

## 2018-12-02 ENCOUNTER — Encounter: Payer: Self-pay | Admitting: Family Medicine

## 2018-12-02 LAB — PAIN MGMT, PROFILE 8 W/CONF, U
6 Acetylmorphine: NEGATIVE ng/mL (ref <10)
ALPHAHYDROXYALPRAZOLAM: 340 ng/mL — AB (ref <25)
Alcohol Metabolites: NEGATIVE ng/mL (ref <500)
Alphahydroxymidazolam: NEGATIVE ng/mL (ref <50)
Alphahydroxytriazolam: NEGATIVE ng/mL (ref <50)
Aminoclonazepam: NEGATIVE ng/mL (ref <25)
Amphetamines: NEGATIVE ng/mL (ref <500)
Benzodiazepines: POSITIVE ng/mL — AB (ref <100)
Buprenorphine, Urine: NEGATIVE ng/mL (ref <5)
CREATININE: 69.7 mg/dL
Cocaine Metabolite: NEGATIVE ng/mL (ref <150)
Codeine: NEGATIVE ng/mL (ref <50)
Hydrocodone: NEGATIVE ng/mL (ref <50)
Hydromorphone: NEGATIVE ng/mL (ref <50)
Hydroxyethylflurazepam: NEGATIVE ng/mL (ref <50)
Lorazepam: NEGATIVE ng/mL (ref <50)
MDMA: NEGATIVE ng/mL (ref <500)
MORPHINE: NEGATIVE ng/mL (ref <50)
Marijuana Metabolite: NEGATIVE ng/mL (ref <20)
Nordiazepam: NEGATIVE ng/mL (ref <50)
Norhydrocodone: NEGATIVE ng/mL (ref <50)
Noroxycodone: 11786 ng/mL — ABNORMAL HIGH (ref <50)
Opiates: NEGATIVE ng/mL (ref <100)
Oxazepam: NEGATIVE ng/mL (ref <50)
Oxidant: NEGATIVE ug/mL (ref <200)
Oxycodone: 1353 ng/mL — ABNORMAL HIGH (ref <50)
Oxycodone: POSITIVE ng/mL — AB (ref <100)
Oxymorphone: 1494 ng/mL — ABNORMAL HIGH (ref <50)
PH: 6.13 (ref 4.5–9.0)
Temazepam: NEGATIVE ng/mL (ref <50)

## 2018-12-02 NOTE — Progress Notes (Signed)
   Subjective:    Patient ID: Angelica [name], female    DOB: [birth year], 78 y.o.   MRN: [id number]  HPI Here for pain management. She has been doing well. She has decided to move to an assisted living unit in [place], but she plans to continue to see [place] for primary care.  Indication for chronic opioid: low back pain  Medication and dose: Oxycodone 20 mg  # pills per month: 180 Last UDS date: 11-29-18 Opioid Treatment Agreement signed (Y/N): 11-29-18 Opioid Treatment Agreement last reviewed with patient:  11-29-18 NCCSRS reviewed this encounter (include red flags):  11-29-18     Review of Systems  Constitutional: Negative.   Respiratory: Negative.   Cardiovascular: Negative.   Musculoskeletal: Positive for back pain.  Neurological: Negative.        Objective:   Physical Exam Constitutional:      Appearance: Normal appearance.  Cardiovascular:     Rate and Rhythm: Normal rate and regular rhythm.     Pulses: Normal pulses.     Heart sounds: Normal heart sounds.  Pulmonary:     Effort: Pulmonary effort is normal.     Breath sounds: Normal breath sounds.  Neurological:     General: No focal deficit present.     Mental Status: She is alert and oriented to person, place, and time.           Assessment & Plan:  Pain management, meds were refilled.  Gershon Crane, MD

## 2018-12-05 ENCOUNTER — Telehealth: Payer: Self-pay | Admitting: Family Medicine

## 2018-12-05 NOTE — Telephone Encounter (Signed)
Copied from CRM [id number]. Topic: Quick Communication - See Telephone Encounter >> Dec 05, 2018  9:26 AM Herby Abraham C wrote: CRM for notification. See Telephone encounter for: 12/05/18.   Matt with [facility] [phone] is calling in to follow up on pt's physicians form. Pt is working to get into the assistant living location and have to have packet completed first. Susy Frizzle is also requesting pt's HMP. Please call him for concerns and questions. He states that he can re-fax forms if provider needs them, just call and request.   Fax: [phone] Attn: Benetta Spar (Matt's nurse)

## 2018-12-05 NOTE — Telephone Encounter (Signed)
Forms are in the red folder to be completed by Dr. Clent Ridges.  Will fax these back tomorrow once he is back in the office.

## 2018-12-09 ENCOUNTER — Telehealth: Payer: Self-pay | Admitting: Neurology

## 2018-12-09 ENCOUNTER — Telehealth: Payer: Self-pay | Admitting: Family Medicine

## 2018-12-09 DIAGNOSIS — Z209 Contact with and (suspected) exposure to unspecified communicable disease: Secondary | ICD-10-CM

## 2018-12-09 NOTE — Telephone Encounter (Signed)
Mary returning call to office regarding paperwork, she is requesting a call back as soon as possible.

## 2018-12-09 NOTE — Telephone Encounter (Signed)
Called and spoke with [employer] and he helped me complete the forms.  I will get them faxed to him today.

## 2018-12-09 NOTE — Telephone Encounter (Deleted)
Copied from CRM [id number]. Topic: Quick Communication - See Telephone Encounter >> Dec 09, 2018 10:40 AM Aretta Nip wrote: CRM for notification. See Telephone encounter for: 12/09/18.

## 2018-12-09 NOTE — Telephone Encounter (Signed)
She needs to prove she does not have TB prior to going into assisted living

## 2018-12-09 NOTE — Telephone Encounter (Signed)
Daughter calling in and left vm needing to speak with you. Please call her back at [phone]. Thanks!

## 2018-12-09 NOTE — Telephone Encounter (Signed)
Copied from CRM [id number]. Topic: Quick Communication - See Telephone Encounter >> Dec 09, 2018 10:57 AM Aretta Nip wrote: CRM for notification. See Telephone encounter for: 12/09/18. Incoming call from a Brennan Bailey who says POA, did not see verification/ says that she is trying to get care facility opening and has sent request in for Dr Clent Ridges and has not heard back .Marland KitchenMarland Kitchenplease call at [phone]

## 2018-12-09 NOTE — Telephone Encounter (Signed)
[facility] at -the office number that I have listed for her--the office was closed.  I have faxed back these papers today to the facility for the pt.

## 2018-12-10 ENCOUNTER — Telehealth: Payer: Self-pay | Admitting: *Deleted

## 2018-12-10 NOTE — Telephone Encounter (Signed)
Copied from CRM [id number]. Topic: General - Inquiry >> Dec 10, 2018  9:18 AM Fanny Bien wrote: Reason for CRM: Matt calling from [facility] called and stated that that he only received half of a page regarding pts FL2. Matts states that this is an urgent matter and needs this to be re faxed. Please advise fax#[phone] (attention victoria)

## 2018-12-10 NOTE — Telephone Encounter (Signed)
I have refaxed these forms (6 in all) to the fax number again.

## 2018-12-11 NOTE — Telephone Encounter (Signed)
I do not have the time to speak to her about this. I assume she has questions about the FL2 we sent. She can relay these questions to [place] and I will address them

## 2018-12-11 NOTE — Telephone Encounter (Signed)
Matt came into the office today and brought in the updated form that needed to be signed by Dr. Clent Ridges.  Copy has been placed to be scanned into the pts chart.

## 2018-12-11 NOTE — Telephone Encounter (Signed)
Patient's POA - Brennan Bailey called and said she would like to speak to only Dr Clent Ridges. She does not want to speak to the nurse. She said the facility received the paperwork yesterday from the office and she has many questions regarding this matter. She said she is on a deadline and would like to speak to the sooner the better. She can be reached at her work @ [phone] or her cell @ [phone]

## 2018-12-11 NOTE — Telephone Encounter (Signed)
Dr Fry please advise. thanks 

## 2018-12-11 NOTE — Telephone Encounter (Signed)
Returned call to pt's daughter, Angelica [name].  No answer.  LMOM asking for return call

## 2018-12-30 ENCOUNTER — Ambulatory Visit (INDEPENDENT_AMBULATORY_CARE_PROVIDER_SITE_OTHER): Payer: Medicare HMO | Admitting: *Deleted

## 2018-12-30 DIAGNOSIS — Z111 Encounter for screening for respiratory tuberculosis: Secondary | ICD-10-CM

## 2018-12-30 NOTE — Progress Notes (Signed)
Per orders of Dr. Clent Ridges, injection of PPD (first injection) given by Chilton Si, Amana Bouska M. Tuberculin skin test applied to LEFT ventral forearm.  Pt is coming back to have PPD read on Thursday 01/02/2019. Second PPD will be placed within the next 1-2 weeks from today.   Patient tolerated injection well.

## 2019-01-02 LAB — TB SKIN TEST: TB Skin Test: NEGATIVE

## 2019-01-02 NOTE — Progress Notes (Signed)
Patient came in today to have her TB skin read - Negative, 0 induration  Pt is coming back on 01/13/2019 to have her second PPD placed.   Form is being held up front in filing cabinet.  Nadara Eaton, CMA

## 2019-01-13 ENCOUNTER — Ambulatory Visit: Payer: Medicare HMO

## 2019-02-06 ENCOUNTER — Telehealth: Payer: Self-pay

## 2019-02-06 NOTE — Telephone Encounter (Signed)
Author phoned pt. to offer virtual awv with PCP. Pt. Stated she does not feel comfortable using her computer, and would rather wait until summer to reschedule with health coach. Pt. Will be [facility] in [place] in summertime, and pt. Stated she would call back to reschedule awv once she gets settled.

## 2019-02-07 ENCOUNTER — Other Ambulatory Visit: Payer: Self-pay | Admitting: Family Medicine

## 2019-02-07 ENCOUNTER — Telehealth: Payer: Self-pay | Admitting: *Deleted

## 2019-02-07 NOTE — Telephone Encounter (Signed)
Copied from CRM [id number]. Topic: General - Other >> Feb 07, 2019  2:25 PM Jaquita Rector A wrote: Reason for CRM: Patient POA called to say that patient is having a hard time with allergies and that the OTC medications are not helping. She would like to know if its possible [facility] [place] [facility] to help with the allergies. Please call Ph# [phone]

## 2019-02-10 MED ORDER — METHYLPREDNISOLONE 4 MG PO TBPK: ORAL_TABLET | ORAL | 0 refills | Status: DC

## 2019-02-10 NOTE — Telephone Encounter (Signed)
The 4 mg size, thanks

## 2019-02-10 NOTE — Telephone Encounter (Signed)
I called the pt and left a detailed message [facility]

## 2019-02-10 NOTE — Telephone Encounter (Signed)
Call in a Medrol dose pack  

## 2019-02-13 ENCOUNTER — Telehealth: Payer: Self-pay | Admitting: *Deleted

## 2019-02-13 NOTE — Telephone Encounter (Signed)
Copied from CRM [id number]. Topic: General - Other >> Feb 13, 2019  1:32 PM Percival Spanish wrote:  Pt call to say she has been having some allergy issues and is asking if Dr Clent Ridges will call her something.    Pt said her friend let her try loratadine 10mg  and it helped her

## 2019-02-14 MED ORDER — LORATADINE 10 MG PO TABS: 10.0000 mg | ORAL_TABLET | Freq: Every day | ORAL | 11 refills | Status: DC

## 2019-02-14 NOTE — Telephone Encounter (Signed)
Called and spoke with pt and she stated that the otc loratadine did not work for her but the prescription one did.  This was sent to the pharmacy for her.

## 2019-02-14 NOTE — Telephone Encounter (Signed)
Loratidine (Claritin) would work fine. This is OTC

## 2019-02-14 NOTE — Telephone Encounter (Signed)
Dr. Fry please advise. Thanks  

## 2019-02-18 DIAGNOSIS — R69 Illness, unspecified: Secondary | ICD-10-CM | POA: Diagnosis not present

## 2019-02-25 ENCOUNTER — Ambulatory Visit: Payer: Medicare HMO

## 2019-03-03 ENCOUNTER — Ambulatory Visit (INDEPENDENT_AMBULATORY_CARE_PROVIDER_SITE_OTHER): Payer: Medicare HMO | Admitting: Family Medicine

## 2019-03-03 ENCOUNTER — Other Ambulatory Visit: Payer: Self-pay

## 2019-03-03 ENCOUNTER — Encounter: Payer: Self-pay | Admitting: Family Medicine

## 2019-03-03 DIAGNOSIS — M48061 Spinal stenosis, lumbar region without neurogenic claudication: Secondary | ICD-10-CM | POA: Diagnosis not present

## 2019-03-03 DIAGNOSIS — F1190 Opioid use, unspecified, uncomplicated: Secondary | ICD-10-CM | POA: Diagnosis not present

## 2019-03-03 MED ORDER — OXYCODONE HCL 20 MG PO TABS: 1.0000 | ORAL_TABLET | ORAL | 0 refills | Status: AC | PRN

## 2019-03-03 MED ORDER — OXYCODONE HCL 20 MG PO TABS: 1.0000 | ORAL_TABLET | ORAL | 0 refills | Status: DC | PRN

## 2019-03-03 MED ORDER — OMEPRAZOLE 20 MG PO CPDR: 20.0000 mg | DELAYED_RELEASE_CAPSULE | Freq: Every day | ORAL | 3 refills | Status: DC

## 2019-03-03 NOTE — Progress Notes (Signed)
Subjective:    Patient ID: Angelica [name], female    DOB: 10/28/1941, 78 y.o.   MRN: [id number]  HPI Virtual Visit via Video Note  I connected with the patient on 03/03/19 at  3:45 PM EDT by a video enabled telemedicine application and verified that I am speaking with the correct person using two identifiers.  Location patient: home Location provider:work or home office Persons participating in the virtual visit: patient, provider  I discussed the limitations of evaluation and management by telemedicine and the availability of in person appointments. The patient expressed understanding and agreed to proceed.   HPI: Here for pain management. She is doing well.  Indication for chronic opioid: low back pain Medication and dose: Oxycodone 20 mg  # pills per month: 180 Last UDS date: 11-29-18 Opioid Treatment Agreement signed (Y/N): 12-05-18 Opioid Treatment Agreement last reviewed with patient:  03-03-19 NCCSRS reviewed this encounter (include red flags):  03-03-19    ROS: See pertinent positives and negatives per HPI.  Past Medical History:  Diagnosis Date  . Allergic rhinitis   . Allergy   . Anxiety   . Blood transfusion   . Cataract   . Chronic back pain    [facility]   . COPD (chronic obstructive pulmonary disease) (HCC)   . Depression   . Diverticulosis    hx  . Gallstones   . GERD (gastroesophageal reflux disease)   . Heart murmur   . Hiatal hernia   . HLD (hyperlipidemia)   . HTN (hypertension)   . Irregular cardiac rhythm   . Osteoarthritis   . Osteoporosis   . PUD (peptic ulcer disease)    duodenal 1980s    Past Surgical History:  Procedure Laterality Date  . CERVICAL LAMINECTOMY  8/07   Dr. Venetia MaxonStern  . CHOLECYSTECTOMY    . COLONOSCOPY  05/28/2008   per Dr. Leone PayorGessner, diverticulosis, adenomatous polyps, repeat in 3 yrs   . HERNIA REPAIR Right   . L shoulder surgery  2007   Dr. Teressa SenterSypher  . LUMBAR DISC SURGERY  1986     Family History  Problem Relation Age of Onset  . Heart disease Father   . Cancer - Cervical Daughter   . Alzheimer's disease Mother   . Colon cancer Neg Hx   . Esophageal cancer Neg Hx   . Stomach cancer Neg Hx   . Rectal cancer Neg Hx      Current Outpatient Medications:  .  alprazolam (XANAX) 2 MG tablet, Take 1 tablet (2 mg total) by mouth 3 (three) times daily., Disp: 270 tablet, Rfl: 1 .  aspirin 81 MG EC tablet, Take 81 mg by mouth daily.  , Disp: , Rfl:  .  atenolol (TENORMIN) 25 MG tablet, Take 1 tablet (25 mg total) by mouth daily., Disp: 90 tablet, Rfl: 3 .  baclofen (LIORESAL) 10 MG tablet, Take 1 tablet (10 mg total) by mouth 3 (three) times daily as needed., Disp: 270 tablet, Rfl: 3 .  beclomethasone (QVAR) 40 MCG/ACT inhaler, Inhale 2 puffs into the lungs 2 (two) times daily., Disp: 1 Inhaler, Rfl: 11 .  cetirizine-pseudoephedrine (ZYRTEC-D) 5-120 MG per tablet, Take 1 tablet by mouth 2 (two) times daily., Disp: , Rfl:  .  diclofenac sodium (VOLTAREN) 1 % GEL, Apply 1 application topically every 6 (six) hours as needed (knee pain )., Disp: 100 g, Rfl: 11 .  donepezil (ARICEPT) 10 MG tablet, Take 1 tablet daily, Disp:  90 tablet, Rfl: 5 .  gabapentin (NEURONTIN) 100 MG capsule, TAKE 1 CAPSULE (100 MG TOTAL) BY MOUTH 3 (THREE) TIMES DAILY., Disp: 270 capsule, Rfl: 3 .  ibuprofen (ADVIL,MOTRIN) 800 MG tablet, Take 1 tablet (800 mg total) by mouth every 8 (eight) hours as needed., Disp: 270 tablet, Rfl: 3 .  loratadine (CLARITIN) 10 MG tablet, Take 1 tablet (10 mg total) by mouth daily., Disp: 30 tablet, Rfl: 11 .  methylPREDNISolone (MEDROL DOSEPAK) 4 MG TBPK tablet, Use as directed, Disp: 21 tablet, Rfl: 0 .  polyethylene glycol powder (GLYCOLAX/MIRALAX) powder, Take 17 g by mouth daily., Disp: , Rfl:  .  promethazine (PHENERGAN) 25 MG tablet, Take 1 tablet (25 mg total) by mouth every 4 (four) hours as needed for nausea., Disp: 180 tablet, Rfl: 3 .  sertraline (ZOLOFT) 100 MG  tablet, Take 1 tablet (100 mg total) by mouth daily., Disp: 90 tablet, Rfl: 3 .  VENTOLIN HFA 108 (90 Base) MCG/ACT inhaler, Inhale 2 puffs into the lungs every 4 (four) hours as needed for shortness of breath., Disp: 3 Inhaler, Rfl: 3 .  VENTOLIN HFA 108 (90 Base) MCG/ACT inhaler, INHALE 2 PUFFS INTO THE LUNGS EVERY 4 (FOUR) HOURS AS NEEDED FOR WHEEZING OR SHORTNESS OF BREATH., Disp: 18 Inhaler, Rfl: 0 .  omeprazole (PRILOSEC) 20 MG capsule, Take 1 capsule (20 mg total) by mouth daily., Disp: 30 capsule, Rfl: 3 .  [START ON 05/03/2019] Oxycodone HCl 20 MG TABS, Take 1 tablet (20 mg total) by mouth every 4 (four) hours as needed for up to 30 days (severe pain)., Disp: 180 tablet, Rfl: 0  EXAM:  VITALS per patient if applicable:  GENERAL: alert, oriented, appears well and in no acute distress  HEENT: atraumatic, conjunttiva clear, no obvious abnormalities on inspection of external nose and ears  NECK: normal movements of the head and neck  LUNGS: on inspection no signs of respiratory distress, breathing rate appears normal, no obvious gross SOB, gasping or wheezing  CV: no obvious cyanosis  MS: moves all visible extremities without noticeable abnormality  PSYCH/NEURO: pleasant and cooperative, no obvious depression or anxiety, speech and thought processing grossly intact  ASSESSMENT AND PLAN: Pain management, meds were refilled.  Gershon Crane, MD  Discussed the following assessment and plan:  No diagnosis found.     I discussed the assessment and treatment plan with the patient. The patient was provided an opportunity to ask questions and all were answered. The patient agreed with the plan and demonstrated an understanding of the instructions.   The patient was advised to call back or seek an in-person evaluation if the symptoms worsen or if the condition fails to improve as anticipated.     Review of Systems     Objective:   Physical Exam        Assessment & Plan:

## 2019-03-05 ENCOUNTER — Other Ambulatory Visit: Payer: Self-pay | Admitting: Family Medicine

## 2019-04-30 ENCOUNTER — Other Ambulatory Visit: Payer: Self-pay | Admitting: Family Medicine

## 2019-05-02 NOTE — Telephone Encounter (Signed)
Refill has been sent to the pharmacy.  

## 2019-06-03 ENCOUNTER — Telehealth: Payer: Self-pay | Admitting: Family Medicine

## 2019-06-03 NOTE — Telephone Encounter (Unsigned)
Copied from [place] [id number]. Topic: Quick Communication - Rx Refill/Question >> Jun 03, 2019  1:08 PM Yvette Rack wrote: Medication: alprazolam Duanne Moron) 2 MG tablet  Has the patient contacted their pharmacy? yes   Preferred Pharmacy (with phone number or street name): [id number] [place] [place]. [address] [phone] (Phone) [phone] (Fax)  Agent: Please be advised that RX refills may take up to 3 business days. We ask that you follow-up with your pharmacy.

## 2019-06-03 NOTE — Telephone Encounter (Signed)
Last filled 10/16/2018 Last OV 03/03/19  Ok to fill?

## 2019-06-04 ENCOUNTER — Other Ambulatory Visit: Payer: Self-pay

## 2019-06-04 MED ORDER — ALPRAZOLAM 2 MG PO TABS: 2.0000 mg | ORAL_TABLET | Freq: Three times a day (TID) | ORAL | 1 refills | Status: DC

## 2019-06-04 NOTE — Telephone Encounter (Signed)
Refill request for Xanax Last filled 10/16/2018 Last OV 03/03/2019  Ok to fill?

## 2019-06-06 NOTE — Telephone Encounter (Signed)
Noted. Nothing further needed. 

## 2019-06-06 NOTE — Telephone Encounter (Signed)
This was already done

## 2019-06-09 ENCOUNTER — Telehealth: Payer: Self-pay | Admitting: Family Medicine

## 2019-06-09 NOTE — Telephone Encounter (Signed)
Please advise. Ok to send in a temporary supply?

## 2019-06-09 NOTE — Telephone Encounter (Signed)
Pt is waiting on mail order and needs #30 alprazolam [facility] [place] [facility] [address]

## 2019-06-10 MED ORDER — ALPRAZOLAM 2 MG PO TABS: 2.0000 mg | ORAL_TABLET | Freq: Three times a day (TID) | ORAL | 0 refills | Status: DC

## 2019-06-10 NOTE — Telephone Encounter (Signed)
Sent in 30# locally in dr Sarajane Jews absence  further refills per PCP

## 2019-06-11 NOTE — Telephone Encounter (Signed)
Patient is aware. Nothing further needed.  

## 2019-06-19 ENCOUNTER — Telehealth: Payer: Self-pay

## 2019-06-19 MED ORDER — VENTOLIN HFA 108 (90 BASE) MCG/ACT IN AERS: 2.0000 | INHALATION_SPRAY | RESPIRATORY_TRACT | 11 refills | Status: AC | PRN

## 2019-06-19 NOTE — Telephone Encounter (Signed)
Rx has been called in  

## 2019-06-19 NOTE — Telephone Encounter (Signed)
-----   Message from Laurey Morale, MD sent at 06/19/2019  7:30 AM EDT ----- Regarding: RE: Epic error Please call in Ventolin HFA to take 2 puffs every 4 hours prn SOB, #1 with 11 rf   ----- Message ----- From: Willy Eddy, RN Sent: 06/18/2019   8:17 AM EDT To: Laurey Morale, MD Subject: Epic error                                     Good morning Dr. Sarajane Jews,   Do to an issue with Epic the refill order did not go through or was auto cancelled.  We are working with Epic and hope to have this issue resolved by 07/03/19.  We apologize for any inconvenience this may cause.  Please place a new order for the medication.  05/02/2019 VENTOLIN HFA 108 (90 BASE) MCG/ACT IN AERS   Thank you,  Zebedee Iba, RN, BSN Ambulatory Analyst

## 2019-06-23 ENCOUNTER — Telehealth: Payer: Self-pay | Admitting: Family Medicine

## 2019-06-23 NOTE — Telephone Encounter (Signed)
Last filled 05/03/19 Last Ov 03/03/2019  Ok to fill?

## 2019-06-23 NOTE — Telephone Encounter (Signed)
See request °

## 2019-06-23 NOTE — Telephone Encounter (Signed)
Copied from [place] [id number]. Topic: Quick Communication - Rx Refill/Question >> Jun 23, 2019  9:24 AM Erick Blinks wrote: Medication: Oxycodone HCl 20 MG TABS -Pt is requesting refill, please advise if appt is necessary.   Has the patient contacted their pharmacy? Yes  (Agent: If no, request that the patient contact the pharmacy for the refill.) (Agent: If yes, when and what did the pharmacy advise?)  Preferred Pharmacy (with phone number or street name): [id number] [place] [place]. [address] [place] [address] Phone: [phone] Fax: [phone]    Agent: Please be advised that RX refills may take up to 3 business days. We ask that you follow-up with your pharmacy.

## 2019-06-23 NOTE — Telephone Encounter (Signed)
This must be done during a PMV

## 2019-06-23 NOTE — Telephone Encounter (Signed)
Appointment has been scheduled. Nothing further needed. 

## 2019-06-25 ENCOUNTER — Encounter: Payer: Self-pay | Admitting: Family Medicine

## 2019-06-25 ENCOUNTER — Telehealth (INDEPENDENT_AMBULATORY_CARE_PROVIDER_SITE_OTHER): Payer: Medicare HMO | Admitting: Family Medicine

## 2019-06-25 DIAGNOSIS — F1190 Opioid use, unspecified, uncomplicated: Secondary | ICD-10-CM | POA: Diagnosis not present

## 2019-06-25 DIAGNOSIS — M48061 Spinal stenosis, lumbar region without neurogenic claudication: Secondary | ICD-10-CM

## 2019-06-25 MED ORDER — OXYCODONE HCL 20 MG PO TABS: 1.0000 | ORAL_TABLET | ORAL | 0 refills | Status: DC | PRN

## 2019-06-25 NOTE — Progress Notes (Signed)
Virtual Visit via Telephone Note  I connected with the patient on 06/25/19 at  4:00 PM EDT by telephone and verified that I am speaking with the correct person using two identifiers. We attempted to connect virtually but we had technical difficulties with the audio and video.     I discussed the limitations, risks, security and privacy concerns of performing an evaluation and management service by telephone and the availability of in person appointments. I also discussed with the patient that there may be a patient responsible charge related to this service. The patient expressed understanding and agreed to proceed.  Location patient: home Location provider: work or home office Participants present for the call: patient, provider Patient did not have a visit in the prior 7 days to address this/these issue(s).   History of Present Illness: Here for pain management. Her back pain is stable but she has been having a lot of leg cramps. She does drink plenty of water daily. Her feet do not swell.  Indication for chronic opioid: low back pain Medication and dose: Oxycodone 20 mg  # pills per month: 180 Last UDS date: 11-29-18 Opioid Treatment Agreement signed (Y/N): 12-05-18 Opioid Treatment Agreement last reviewed with patient:  06-25-19 NCCSRS reviewed this encounter (include red flags): Yes    Observations/Objective: Patient sounds cheerful and well on the phone. I do not appreciate any SOB. Speech and thought processing are grossly intact. Patient reported vitals:  Assessment and Plan: Pain management, meds were refilled. For the leg cramps she will try taking 400 mg of magnesium at bedtime each night.  Alysia Penna, MD   Follow Up Instructions:     [phone] 5-10 [phone] 11-20 [phone] 21-30 I did not refer this patient for an OV in the next 24 hours for this/these issue(s).  I discussed the assessment and treatment plan with the patient. The patient was provided an opportunity to ask  questions and all were answered. The patient agreed with the plan and demonstrated an understanding of the instructions.   The patient was advised to call back or seek an in-person evaluation if the symptoms worsen or if the condition fails to improve as anticipated.  I provided 16 minutes of non-face-to-face time during this encounter.   Alysia Penna, MD

## 2019-07-14 ENCOUNTER — Telehealth: Payer: Self-pay | Admitting: Family Medicine

## 2019-07-14 NOTE — Telephone Encounter (Signed)
Please see note. I don't think we can do virtual appointment and the patient is out of town.

## 2019-07-14 NOTE — Telephone Encounter (Signed)
Spoke with patient. She is wanting a prescription called in to [facility], [place], Phone: [phone].  Please advise.

## 2019-07-14 NOTE — Telephone Encounter (Signed)
Pt calling back. Please call number [phone]

## 2019-07-14 NOTE — Telephone Encounter (Signed)
Pt is out of town in [place] and has broken out with shingles on her right arm. Pt would like a call from Dr. Barbie Banner nurse / please advise and Pt has a new number   Cb# [phone]

## 2019-07-14 NOTE — Telephone Encounter (Signed)
Patient will need to have a virtual appointment.  Left message for patient to call back. CRM created.

## 2019-07-14 NOTE — Telephone Encounter (Signed)
Set up a telephone visit with me tomorrow and we can take care of this

## 2019-07-14 NOTE — Telephone Encounter (Signed)
See note

## 2019-07-15 ENCOUNTER — Other Ambulatory Visit: Payer: Self-pay

## 2019-07-15 ENCOUNTER — Encounter: Payer: Self-pay | Admitting: Family Medicine

## 2019-07-15 ENCOUNTER — Telehealth (INDEPENDENT_AMBULATORY_CARE_PROVIDER_SITE_OTHER): Payer: Medicare HMO | Admitting: Family Medicine

## 2019-07-15 DIAGNOSIS — B029 Zoster without complications: Secondary | ICD-10-CM

## 2019-07-15 MED ORDER — METHYLPREDNISOLONE 4 MG PO TBPK: ORAL_TABLET | ORAL | 0 refills | Status: DC

## 2019-07-15 MED ORDER — VALACYCLOVIR HCL 1 G PO TABS: 1000.0000 mg | ORAL_TABLET | Freq: Three times a day (TID) | ORAL | 0 refills | Status: DC

## 2019-07-15 NOTE — Telephone Encounter (Signed)
Appointment scheduled. Nothing further need.

## 2019-07-15 NOTE — Progress Notes (Signed)
Virtual Visit via Telephone Note  I connected with the patient on 07/15/19 at 11:30 AM EDT by telephone and verified that I am speaking with the correct person using two identifiers. We attempted to connect virtually but we had technical difficulties with the audio and video.     I discussed the limitations, risks, security and privacy concerns of performing an evaluation and management service by telephone and the availability of in person appointments. I also discussed with the patient that there may be a patient responsible charge related to this service. The patient expressed understanding and agreed to proceed.  Location patient: home Location provider: work or home office Participants present for the call: patient, provider Patient did not have a visit in the prior 7 days to address this/these issue(s).   History of Present Illness: Here for 2 days of a rash on the right arm around the elbow. This consists of clusters of red blisters that burn and itch. She has had shingles before, and she is certain that this is another bout. She is temporarily living outside [place], [place].    Observations/Objective: Patient sounds cheerful and well on the phone. I do not appreciate any SOB. Speech and thought processing are grossly intact. Patient reported vitals:  Assessment and Plan: Shingles, treat with Valtrex and a Medrol dose pack.  Alysia Penna, MD   Follow Up Instructions:     [phone] 5-10 [phone] 11-20 [phone] 21-30 I did not refer this patient for an OV in the next 24 hours for this/these issue(s).  I discussed the assessment and treatment plan with the patient. The patient was provided an opportunity to ask questions and all were answered. The patient agreed with the plan and demonstrated an understanding of the instructions.   The patient was advised to call back or seek an in-person evaluation if the symptoms worsen or if the condition fails to improve as anticipated.  I provided  14 minutes of non-face-to-face time during this encounter.   Alysia Penna, MD

## 2019-07-15 NOTE — Telephone Encounter (Signed)
ATC. Unable to leave voicemail.

## 2019-07-15 NOTE — Telephone Encounter (Signed)
Patient called back. Unable to connect with office. Please advise.

## 2019-08-07 DIAGNOSIS — Z0279 Encounter for issue of other medical certificate: Secondary | ICD-10-CM

## 2019-08-21 ENCOUNTER — Telehealth: Payer: Self-pay

## 2019-08-21 NOTE — Telephone Encounter (Signed)
Copied from [place] [id number]. Topic: General - Inquiry >> Aug 21, 2019  4:20 PM Berneta Levins wrote: Reason for CRM:   Seth Bake from [facility] [place] calling.  States that she needs to speak with someone to do a FL2 verification. Seth Bake can be reached at [phone]

## 2019-08-22 ENCOUNTER — Telehealth: Payer: Self-pay | Admitting: Family Medicine

## 2019-08-22 NOTE — Telephone Encounter (Signed)
LVM for Angelica [name] to fax over [facility] for Dr. Sarajane Jews to complete

## 2019-08-22 NOTE — Telephone Encounter (Signed)
Pryor Curia, POA and [place] is calling because the patient is in [place], of [place].  Calling to request orders to be sent to [place] or Patient Care Coordinator  Needing clarity on medications and need orders to be sent.  Patient is arguing on what she should be taking.  CB number on [place] [phone] [place],RN or Seth Bake, Patient Care Coordinator.  Pryor Curia- [place]- [phone]

## 2019-08-22 NOTE — Telephone Encounter (Signed)
Seth Bake checking on the status of message below, please advise

## 2019-08-22 NOTE — Telephone Encounter (Signed)
Angelica [name] needs to speak with [name] to discuss medications for the [facility] forms.  Call back Main number  [phone]

## 2019-08-22 NOTE — Telephone Encounter (Signed)
Seth Bake called checking status when she can get a call back form PCP nurse for [facility] verifications.  Seth Bake call back  [phone]

## 2019-08-25 MED ORDER — ALPRAZOLAM 2 MG PO TABS: 2.0000 mg | ORAL_TABLET | Freq: Every evening | ORAL | 5 refills | Status: DC | PRN

## 2019-08-25 MED ORDER — OXYCODONE HCL 20 MG PO TABS: 1.0000 | ORAL_TABLET | ORAL | 0 refills | Status: AC | PRN

## 2019-08-25 NOTE — Telephone Encounter (Signed)
Spoke to [place] and she stated they received pt med list so that she could move into assisted living facility. Seth Bake stated she received med list from [place] but pt brung in other Rx not on list. Seth Bake wants to know if pt can take listed medications that's not on her list.  Magnisium Citrace- I saw pt take this for leg cramps but not on med list okay to continue?  Vit D- OTC  Aspirin 81 mg- Pt stated she takes this daily was never prescribed by you but on pt med list ( old script) Aricept Systene- Not prescribed by you OTC. Okay to take Omeprazole- Pt wants to know if she can take this PRN instead of daily  Alprazolam- This is on pt med list for 3 times daily. Pt wants to know if she can take this at bedtime PRN?   Hydrocodone and Alprazolam needs to be refilled and sent to [facility] [place] now

## 2019-08-25 NOTE — Telephone Encounter (Signed)
Also Qvar was added on form and pt stated she does not take this anymore. Do not see on pt meds list is pt suppose to take?

## 2019-08-25 NOTE — Telephone Encounter (Signed)
(  1) I took Qvar off her list, (2) I changed the Xanax to use at bedtime prn sleep, and I sent in a 6 month supply, and (3) I sent in a 30 day supply of Oxycodone. When this is almost out, she will need a PMV for more refills

## 2019-08-25 NOTE — Telephone Encounter (Signed)
Please see other message from 08/22/2019.

## 2019-08-27 NOTE — Telephone Encounter (Signed)
Seth Bake notified of update and will send in the VO form to sign off on.    FYI

## 2019-08-27 NOTE — Telephone Encounter (Signed)
Noted  

## 2019-08-27 NOTE — Telephone Encounter (Signed)
Patient is requesting a call from Dr Sarajane Jews as soon as possible in regards to her prescription refill shes not getting .  Please advise

## 2019-09-01 NOTE — Telephone Encounter (Signed)
Called pt regarding issue. Pt does not remember what she called for. Pt stated that she thinks all her prescriptions was taking care of.

## 2019-11-26 ENCOUNTER — Telehealth: Payer: Self-pay | Admitting: Family Medicine

## 2019-11-26 NOTE — Telephone Encounter (Signed)
She needs a PMV 

## 2019-11-26 NOTE — Telephone Encounter (Signed)
Medication Refill - Medication: Oxycodone HCl 20 MG TABS   Has the patient contacted their pharmacy? Yes.   (Agent: If no, request that the patient contact the pharmacy for the refill.) (Agent: If yes, when and what did the pharmacy advise?)  Preferred Pharmacy (with phone number or street name):  [facility] [place], [place] - [address]  [address] [place] [address]  Phone: [phone] Fax: [phone]    Agent: Please be advised that RX refills may take up to 3 business days. We ask that you follow-up with your pharmacy.

## 2019-11-26 NOTE — Telephone Encounter (Signed)
Message Routed to PCP CMA 

## 2019-11-26 NOTE — Telephone Encounter (Signed)
Last filled 08/25/2019 Last OV 07/15/2019  Ok to fill?

## 2019-11-26 NOTE — Telephone Encounter (Signed)
Left message for patient to call back. CRM created 

## 2019-12-01 ENCOUNTER — Other Ambulatory Visit: Payer: Self-pay

## 2019-12-01 ENCOUNTER — Telehealth (INDEPENDENT_AMBULATORY_CARE_PROVIDER_SITE_OTHER): Payer: Medicare HMO | Admitting: Family Medicine

## 2019-12-01 DIAGNOSIS — M48061 Spinal stenosis, lumbar region without neurogenic claudication: Secondary | ICD-10-CM | POA: Diagnosis not present

## 2019-12-01 DIAGNOSIS — F1190 Opioid use, unspecified, uncomplicated: Secondary | ICD-10-CM | POA: Diagnosis not present

## 2019-12-01 MED ORDER — OXYCODONE HCL 20 MG PO TABS: 1.0000 | ORAL_TABLET | ORAL | 0 refills | Status: DC | PRN

## 2019-12-01 MED ORDER — OXYCODONE HCL 20 MG PO TABS: 1.0000 | ORAL_TABLET | ORAL | 0 refills | Status: AC | PRN

## 2019-12-01 NOTE — Progress Notes (Signed)
Virtual Visit via Telephone Note  I connected with the patient on 12/01/19 at  3:30 PM EST by telephone and verified that I am speaking with the correct person using two identifiers.   I discussed the limitations, risks, security and privacy concerns of performing an evaluation and management service by telephone and the availability of in person appointments. I also discussed with the patient that there may be a patient responsible charge related to this service. The patient expressed understanding and agreed to proceed.  Location patient: home Location provider: work or home office Participants present for the call: patient, provider Patient did not have a visit in the prior 7 days to address this/these issue(s).   History of Present Illness: Here for pain management, she is doing well.  Indication for chronic opioid: low back pain  Medication and dose: oxycodone 20 mg # pills per month: 180 Last UDS date: 11-29-18 Opioid Treatment Agreement signed (Y/N): 12-05-18 Opioid Treatment Agreement last reviewed with patient:  12-01-19 NCCSRS reviewed this encounter (include red flags): Yes    Observations/Objective: Patient sounds cheerful and well on the phone. I do not appreciate any SOB. Speech and thought processing are grossly intact. Patient reported vitals:  Assessment and Plan: Pain management, meds were refilled.  Gershon Crane, MD   Follow Up Instructions:     [phone] 5-10 [phone] 11-20 9443 21-30 I did not refer this patient for an OV in the next 24 hours for this/these issue(s).  I discussed the assessment and treatment plan with the patient. The patient was provided an opportunity to ask questions and all were answered. The patient agreed with the plan and demonstrated an understanding of the instructions.   The patient was advised to call back or seek an in-person evaluation if the symptoms worsen or if the condition fails to improve as anticipated.  I provided 13  minutes of non-face-to-face time during this encounter.   Gershon Crane, MD

## 2019-12-02 NOTE — Telephone Encounter (Signed)
Left message for patient to call back  

## 2019-12-09 NOTE — Telephone Encounter (Signed)
Unable to reach patient. Message will be closed. 

## 2019-12-19 DIAGNOSIS — J440 Chronic obstructive pulmonary disease with acute lower respiratory infection: Secondary | ICD-10-CM | POA: Diagnosis not present

## 2019-12-19 DIAGNOSIS — R05 Cough: Secondary | ICD-10-CM | POA: Diagnosis not present

## 2019-12-19 DIAGNOSIS — N390 Urinary tract infection, site not specified: Secondary | ICD-10-CM | POA: Diagnosis not present

## 2019-12-19 DIAGNOSIS — J441 Chronic obstructive pulmonary disease with (acute) exacerbation: Secondary | ICD-10-CM | POA: Diagnosis not present

## 2019-12-19 DIAGNOSIS — R0602 Shortness of breath: Secondary | ICD-10-CM | POA: Diagnosis not present

## 2019-12-19 DIAGNOSIS — I700 Atherosclerosis of aorta: Secondary | ICD-10-CM | POA: Diagnosis not present

## 2019-12-19 DIAGNOSIS — J209 Acute bronchitis, unspecified: Secondary | ICD-10-CM | POA: Diagnosis not present

## 2020-02-16 ENCOUNTER — Telehealth: Payer: Self-pay | Admitting: Family Medicine

## 2020-02-16 NOTE — Telephone Encounter (Signed)
Pt called in requesting an in office appt. Pt explainedshe has bronchitis (bad cough) and Dr. Clent Ridges always see her in person because he has to give her a shot for it.  Please follow up with pt on if she can be seen in office.

## 2020-02-17 MED ORDER — AZITHROMYCIN 250 MG PO TABS: ORAL_TABLET | ORAL | 0 refills | Status: DC

## 2020-02-17 NOTE — Telephone Encounter (Signed)
The only way we can see her in person is if she has had both Covid vaccine doses or if she has recently tested negative for the Covid virus

## 2020-02-17 NOTE — Telephone Encounter (Signed)
Rx has been sent in. [facility] facility is aware

## 2020-02-17 NOTE — Telephone Encounter (Signed)
Sue Lush from assisted living facility [facility] called to check on Dr. Clent Ridges decision about pt being seen in office or if something would be prescribed for her.   Phone: [phone]

## 2020-02-17 NOTE — Telephone Encounter (Signed)
No need for an OV. Call in a Zpack

## 2020-02-17 NOTE — Telephone Encounter (Signed)
Angelica [name] wants Dr. Claris Che nurse to call her. She needs to know if there is an appointment for this patient because she needs to make arrangements to bring her.   She apologized for [place] being in the middle of this and that there are new people working at the facility that the patient is at.  Please advise

## 2020-02-17 NOTE — Telephone Encounter (Signed)
Message has been sent to Dr. Clent Ridges. Awaiting his response.

## 2020-02-17 NOTE — Telephone Encounter (Signed)
Angelica [name] called to find out if Clent Ridges would call in a prescription for her bronchitis to the assisted living facility [facility]. She states that she has been vaccinated and she gets tested for COVID every week at the facility. Corrie Dandy would like a call back today with Fry's decision on if he will see her in person or call something in.   Angelica [name] can be reached at [phone]

## 2020-02-18 MED ORDER — AZITHROMYCIN 250 MG PO TABS: ORAL_TABLET | ORAL | 0 refills | Status: DC

## 2020-02-18 NOTE — Telephone Encounter (Signed)
The Rx was supposed to be sent to  [facility] [place], [place] - [address] Phone:  [phone]  Fax:  [phone]     Instead of [facility]  Please re-send to this Pharmacy

## 2020-02-18 NOTE — Telephone Encounter (Signed)
Rx has been sent to the requested pharmacy.  

## 2020-02-18 NOTE — Addendum Note (Signed)
Addended by: Solon Augusta on: 02/18/2020 10:51 AM   Modules accepted: Orders

## 2020-02-24 ENCOUNTER — Telehealth: Payer: Self-pay | Admitting: Family Medicine

## 2020-02-24 NOTE — Telephone Encounter (Signed)
Pt finish Z-pak and still having coughing. Patient tested negative for COVID last month and also received COVID vaccine 2 months ago. Pt wants to know is there anything else she can do. Do she need an appointment?

## 2020-02-24 NOTE — Telephone Encounter (Signed)
Set up an in person OV so I can examine her  

## 2020-02-25 NOTE — Telephone Encounter (Signed)
Per Okey Regal the patient has been scheduled.

## 2020-02-26 ENCOUNTER — Other Ambulatory Visit: Payer: Self-pay

## 2020-02-27 ENCOUNTER — Ambulatory Visit: Payer: Medicare HMO | Admitting: Family Medicine

## 2020-02-27 ENCOUNTER — Ambulatory Visit (INDEPENDENT_AMBULATORY_CARE_PROVIDER_SITE_OTHER): Payer: Medicare HMO | Admitting: Family Medicine

## 2020-02-27 ENCOUNTER — Encounter: Payer: Self-pay | Admitting: Family Medicine

## 2020-02-27 VITALS — BP 130/64 | HR 82 | Temp 97.4°F | Wt 135.6 lb

## 2020-02-27 DIAGNOSIS — J440 Chronic obstructive pulmonary disease with acute lower respiratory infection: Secondary | ICD-10-CM | POA: Diagnosis not present

## 2020-02-27 DIAGNOSIS — J209 Acute bronchitis, unspecified: Secondary | ICD-10-CM | POA: Diagnosis not present

## 2020-02-27 DIAGNOSIS — F1190 Opioid use, unspecified, uncomplicated: Secondary | ICD-10-CM

## 2020-02-27 DIAGNOSIS — J439 Emphysema, unspecified: Secondary | ICD-10-CM | POA: Diagnosis not present

## 2020-02-27 MED ORDER — DOXYCYCLINE HYCLATE 100 MG PO CAPS: 100.0000 mg | ORAL_CAPSULE | Freq: Two times a day (BID) | ORAL | 0 refills | Status: AC

## 2020-02-27 MED ORDER — METHYLPREDNISOLONE ACETATE 40 MG/ML IJ SUSP
40.0000 mg | Freq: Once | INTRAMUSCULAR | Status: AC
  Administered 2020-02-27: 40 mg via INTRAMUSCULAR

## 2020-02-27 MED ORDER — METHYLPREDNISOLONE ACETATE 80 MG/ML IJ SUSP
80.0000 mg | Freq: Once | INTRAMUSCULAR | Status: AC
  Administered 2020-02-27: 80 mg via INTRAMUSCULAR

## 2020-02-27 MED ORDER — ADVAIR HFA 230-21 MCG/ACT IN AERO: 2.0000 | INHALATION_SPRAY | Freq: Two times a day (BID) | RESPIRATORY_TRACT | 11 refills | Status: DC

## 2020-02-27 NOTE — Progress Notes (Signed)
   Subjective:    Patient ID: Angelica [name], female    DOB: 27-Jul-1941, 79 y.o.   MRN: [id number]  HPI Here for 2 months of dry cough and wheezing. No fever or chest pain. We gave her a Zpack on 02-16-20 but this did not help. She is using her albuterol inhaler several times a day. She gets Covid tested weekly at the facility where she lives.    Review of Systems  Constitutional: Negative.   HENT: Negative.   Eyes: Negative.   Respiratory: Positive for cough, chest tightness and wheezing. Negative for apnea, choking and shortness of breath.   Cardiovascular: Negative.        Objective:   Physical Exam Constitutional:      Appearance: Normal appearance. She is not ill-appearing.  HENT:     Right Ear: Tympanic membrane, ear canal and external ear normal.     Left Ear: Tympanic membrane, ear canal and external ear normal.     Nose: Nose normal.     Mouth/Throat:     Pharynx: Oropharynx is clear.  Eyes:     Conjunctiva/sclera: Conjunctivae normal.  Cardiovascular:     Rate and Rhythm: Normal rate and regular rhythm.     Pulses: Normal pulses.     Heart sounds: Normal heart sounds.  Pulmonary:     Effort: Pulmonary effort is normal.     Breath sounds: No rhonchi or rales.     Comments: Scattered wheezes  Lymphadenopathy:     Cervical: No cervical adenopathy.  Neurological:     Mental Status: She is alert.           Assessment & Plan:  Bronchitis on top of COPD. Given a shot of DepoMedrol. Treat with Doxycycline. Start on Advair HFA BID.  Gershon Crane, MD

## 2020-02-27 NOTE — Addendum Note (Signed)
Addended by: Solon Augusta on: 02/27/2020 10:17 AM   Modules accepted: Orders

## 2020-02-29 LAB — PAIN MGMT, PROFILE 8 W/CONF, U
6 Acetylmorphine: NEGATIVE ng/mL
Alcohol Metabolites: NEGATIVE ng/mL (ref <500)
Alphahydroxyalprazolam: 950 ng/mL
Alphahydroxymidazolam: NEGATIVE ng/mL
Alphahydroxytriazolam: NEGATIVE ng/mL
Aminoclonazepam: NEGATIVE ng/mL
Amphetamines: NEGATIVE ng/mL
Benzodiazepines: POSITIVE ng/mL
Buprenorphine, Urine: NEGATIVE ng/mL
Buprenorphine: NEGATIVE ng/mL
Cocaine Metabolite: NEGATIVE ng/mL
Codeine: NEGATIVE ng/mL
Creatinine: 193.3 mg/dL
Hydrocodone: NEGATIVE ng/mL
Hydromorphone: NEGATIVE ng/mL
Hydroxyethylflurazepam: NEGATIVE ng/mL
Lorazepam: NEGATIVE ng/mL
MDMA: NEGATIVE ng/mL
Marijuana Metabolite: NEGATIVE ng/mL
Morphine: NEGATIVE ng/mL
Norbuprenorphine: NEGATIVE ng/mL
Nordiazepam: NEGATIVE ng/mL
Norhydrocodone: NEGATIVE ng/mL
Noroxycodone: >10000 ng/mL
Opiates: NEGATIVE ng/mL
Oxazepam: NEGATIVE ng/mL
Oxidant: NEGATIVE ug/mL
Oxycodone: 9987 ng/mL
Oxycodone: POSITIVE ng/mL
Oxymorphone: 5723 ng/mL
Temazepam: NEGATIVE ng/mL
pH: 5.7 (ref 4.5–9.0)

## 2020-03-01 ENCOUNTER — Telehealth (INDEPENDENT_AMBULATORY_CARE_PROVIDER_SITE_OTHER): Payer: Medicare HMO | Admitting: Family Medicine

## 2020-03-01 ENCOUNTER — Encounter: Payer: Self-pay | Admitting: Family Medicine

## 2020-03-01 ENCOUNTER — Other Ambulatory Visit: Payer: Self-pay

## 2020-03-01 DIAGNOSIS — F1190 Opioid use, unspecified, uncomplicated: Secondary | ICD-10-CM

## 2020-03-01 DIAGNOSIS — M48061 Spinal stenosis, lumbar region without neurogenic claudication: Secondary | ICD-10-CM | POA: Diagnosis not present

## 2020-03-01 NOTE — Progress Notes (Signed)
   Subjective:    Patient ID: Angelica [name], female    DOB: 25-Nov-1940, 79 y.o.   MRN: [id number]  HPI Virtual Visit via Telephone Note  I connected with the patient on 03/01/20 at  1:15 PM EDT by telephone and verified that I am speaking with the correct person using two identifiers.   I discussed the limitations, risks, security and privacy concerns of performing an evaluation and management service by telephone and the availability of in person appointments. I also discussed with the patient that there may be a patient responsible charge related to this service. The patient expressed understanding and agreed to proceed.  Location patient: home Location provider: work or home office Participants present for the call: patient, provider Patient did not have a visit in the prior 7 days to address this/these issue(s).   History of Present Illness: Here for pain management, she is doing well.  Indication for chronic opioid: low back pain Medication and dose: Oxycodone 20 mg  # pills per month: 180 Last UDS date: 02-27-20 Opioid Treatment Agreement signed (Y/N): 12-05-18 Opioid Treatment Agreement last reviewed with patient:  03-01-20 NCCSRS reviewed this encounter (include red flags): Yes    Observations/Objective: Patient sounds cheerful and well on the phone. I do not appreciate any SOB. Speech and thought processing are grossly intact. Patient reported vitals:  Assessment and Plan: Pain management, meds were refilled.  Gershon Crane, MD   Follow Up Instructions:     [phone] 5-10 [phone] 11-20 9443 21-30 I did not refer this patient for an OV in the next 24 hours for this/these issue(s).  I discussed the assessment and treatment plan with the patient. The patient was provided an opportunity to ask questions and all were answered. The patient agreed with the plan and demonstrated an understanding of the instructions.   The patient was advised to call back or seek an in-person  evaluation if the symptoms worsen or if the condition fails to improve as anticipated.  I provided 14 minutes of non-face-to-face time during this encounter.   Gershon Crane, MD    Review of Systems     Objective:   Physical Exam        Assessment & Plan:

## 2020-03-06 ENCOUNTER — Other Ambulatory Visit: Payer: Self-pay | Admitting: Family Medicine

## 2020-03-09 NOTE — Telephone Encounter (Signed)
Okay for refill?   LOV 02/2020  Last refill 08/2019 30 with 5 refills

## 2020-04-14 ENCOUNTER — Telehealth: Payer: Self-pay | Admitting: Family Medicine

## 2020-04-14 NOTE — Telephone Encounter (Signed)
Angelica [name] from [name] needs to speak to Dr. Clent Ridges or CMA in regards to get an overall general health and mental status of the pt and to answer any questions regarding POA. She needs a call back asap  [name] can be reached at [phone]

## 2020-04-15 NOTE — Telephone Encounter (Signed)
Spoke with [name]. She wanted to know what diagnoses were on the patients chart and if they were regulated by medication.   She also wanted to know if we remembered Corrie Dandy coming in with Kathie Rhodes at her last appointment but I could not recall.   She would like to know from Dr. Clent Ridges, if he has any concerns with Angelica [name] being taken advantage of, or and red flags when Corrie Dandy did come in with [place]. Did he notice a difference in the patient? Does he know how Corrie Dandy came to be Pihu's POA? Is there any reason that he should be concerned that Rodina is being mistreated.

## 2020-04-15 NOTE — Telephone Encounter (Signed)
Please call her and tell her that Sharone Picchi has very good overall health. She has very mild dementia but she can function adequately

## 2020-04-15 NOTE — Telephone Encounter (Signed)
Left message for Rebecca to call back.

## 2020-04-16 ENCOUNTER — Telehealth: Payer: Self-pay | Admitting: Family Medicine

## 2020-04-16 NOTE — Telephone Encounter (Signed)
Angelica [name] has mild dementia which is being treated with Aricept. I have no idea what kind of relationship this Corrie Dandy has with Teresa Coombs. I have no reason to suspect Madiline Saffran is being mistreated or taken advantage of

## 2020-04-16 NOTE — Telephone Encounter (Signed)
Angelica [name] with Social Services in [place] would like to have a call back to complete her notes

## 2020-04-16 NOTE — Telephone Encounter (Signed)
Called Angelica [name] back. She wanted to know when the patient was diagnosed with dementia and if having mild dementia could effect her ability to make decisions for her self such as DNR orders and POA. I Informed Angelica [name] that the date of Diagnoses that I saw was 09/14/2017 and that dementia could effect a persons ability to make these decisions how ever that does mean That [name] is unable to make these decisions for herself. This should be determined on a case by case bases and they should not assume that she is unable to make decisions for herself just because of a diagnoses of mild dementia.  Nothing further needed at this time.

## 2020-04-16 NOTE — Telephone Encounter (Signed)
Spoke with Angelica [name], the message from Dr. Clent Ridges was given. No further information is needed.

## 2020-04-19 ENCOUNTER — Telehealth: Payer: Self-pay | Admitting: Family Medicine

## 2020-04-19 NOTE — Telephone Encounter (Signed)
Angelica [name] pt care coordinator at [facility] call want a refill on

## 2020-04-22 ENCOUNTER — Telehealth: Payer: Self-pay | Admitting: Family Medicine

## 2020-04-22 NOTE — Telephone Encounter (Signed)
Please advise. This is not on the patients med list. 

## 2020-04-22 NOTE — Telephone Encounter (Signed)
pt is requesting a refill for oxycodone 20 mg  pharmacy fax to   [phone]  [facility] pharmacy. any question, please call  [phone]  assist living

## 2020-04-23 ENCOUNTER — Telehealth: Payer: Self-pay | Admitting: Family Medicine

## 2020-04-23 NOTE — Telephone Encounter (Signed)
Spoke to pt and she will call back to schedule a pain management visit.

## 2020-04-23 NOTE — Telephone Encounter (Addendum)
Patient has been out of pain medication for 3-4 days. Nursing facility has said they have been trying for 2 weeks to get this refilled.    Medication- Oxycodone 20 mgs  HCPOA Brennan Bailey wants someone to call the patient- Brennan Bailey requests a call back today once it is taken care of.

## 2020-04-23 NOTE — Telephone Encounter (Signed)
She needs a PMV 

## 2020-04-23 NOTE — Telephone Encounter (Signed)
Spoke with the patient she is aware that she needs an appointment for this medication.  I do not have Angelica [name]'s number to call her back. Patient was unable to provide this number for me as well.

## 2020-04-23 NOTE — Telephone Encounter (Signed)
ATC the patient and let her know she needs an appointment for refills. Unable to leave a message. Mail box is full. Will try back at a later date.

## 2020-04-28 ENCOUNTER — Telehealth (INDEPENDENT_AMBULATORY_CARE_PROVIDER_SITE_OTHER): Payer: Medicare HMO | Admitting: Family Medicine

## 2020-04-28 ENCOUNTER — Other Ambulatory Visit: Payer: Self-pay

## 2020-04-28 ENCOUNTER — Encounter: Payer: Self-pay | Admitting: Family Medicine

## 2020-04-28 DIAGNOSIS — M48061 Spinal stenosis, lumbar region without neurogenic claudication: Secondary | ICD-10-CM | POA: Diagnosis not present

## 2020-04-28 DIAGNOSIS — F1190 Opioid use, unspecified, uncomplicated: Secondary | ICD-10-CM | POA: Diagnosis not present

## 2020-04-28 MED ORDER — OXYCODONE HCL 20 MG PO TABS: 1.0000 | ORAL_TABLET | ORAL | 0 refills | Status: DC

## 2020-04-28 NOTE — Progress Notes (Signed)
   Subjective:    Patient ID: Angelica [name], female    DOB: [birth year], 79 y.o.   MRN: [id number]  HPI Virtual Visit via Telephone Note  I connected with the patient on 04/28/20 at  2:30 PM EDT by telephone and verified that I am speaking with the correct person using two identifiers.   I discussed the limitations, risks, security and privacy concerns of performing an evaluation and management service by telephone and the availability of in person appointments. I also discussed with the patient that there may be a patient responsible charge related to this service. The patient expressed understanding and agreed to proceed.  Location patient: home Location provider: work or home office Participants present for the call: patient, provider Patient did not have a visit in the prior 7 days to address this/these issue(s).   History of Present Illness: Her for pain management, she is doing well.  Indication for chronic opioid: low back pain Medication and dose: Oxycodone 20 mg # pills per month: 180 Last UDS date: 02-27-20 Opioid Treatment Agreement signed (Y/N): 12-05-18 Opioid Treatment Agreement last reviewed with patient:  04-28-20 NCCSRS reviewed this encounter (include red flags): Yes    Observations/Objective: Patient sounds cheerful and well on the phone. I do not appreciate any SOB. Speech and thought processing are grossly intact. Patient reported vitals:  Assessment and Plan: Pain management, meds were refilled.  Gershon Crane, MD   Follow Up Instructions:     [phone] 5-10 [phone] 11-20 9443 21-30 I did not refer this patient for an OV in the next 24 hours for this/these issue(s).  I discussed the assessment and treatment plan with the patient. The patient was provided an opportunity to ask questions and all were answered. The patient agreed with the plan and demonstrated an understanding of the instructions.   The patient was advised to call back or seek an in-person  evaluation if the symptoms worsen or if the condition fails to improve as anticipated.  I provided 14 minutes of non-face-to-face time during this encounter.   Gershon Crane, MD    Review of Systems     Objective:   Physical Exam        Assessment & Plan:

## 2020-04-30 ENCOUNTER — Telehealth: Payer: Self-pay

## 2020-04-30 NOTE — Telephone Encounter (Signed)
George Ina Key: [id number] - Rx #: [id number] Need help? Call us at [phone] Outcome Additional Information Required Authorization already on file for this request.Authorization starting on 04/29/2020 and ending on 10/25/2020. Drug oxyCODONE HCl 20MG  tablets Form [employer] Electronic PA Form

## 2020-05-07 NOTE — Telephone Encounter (Signed)
Checked the status on the PA on cover my meds.  "Authorization already on file for this request.Authorization starting on 04/29/2020 and ending on 10/25/2020." PA not needed.  Patient is aware.

## 2020-06-02 ENCOUNTER — Other Ambulatory Visit: Payer: Self-pay

## 2020-06-02 ENCOUNTER — Ambulatory Visit: Payer: Medicare HMO

## 2020-06-02 NOTE — Progress Notes (Signed)
Erroneous encounter

## 2020-06-03 ENCOUNTER — Telehealth: Payer: Self-pay | Admitting: Family Medicine

## 2020-06-03 ENCOUNTER — Telehealth (INDEPENDENT_AMBULATORY_CARE_PROVIDER_SITE_OTHER): Payer: Medicare HMO | Admitting: Family Medicine

## 2020-06-03 ENCOUNTER — Encounter: Payer: Self-pay | Admitting: Family Medicine

## 2020-06-03 DIAGNOSIS — J440 Chronic obstructive pulmonary disease with acute lower respiratory infection: Secondary | ICD-10-CM

## 2020-06-03 DIAGNOSIS — J209 Acute bronchitis, unspecified: Secondary | ICD-10-CM

## 2020-06-03 MED ORDER — PREDNISONE 20 MG PO TABS: 40.0000 mg | ORAL_TABLET | Freq: Every day | ORAL | 0 refills | Status: DC

## 2020-06-03 MED ORDER — ALBUTEROL SULFATE HFA 108 (90 BASE) MCG/ACT IN AERS: 2.0000 | INHALATION_SPRAY | RESPIRATORY_TRACT | 1 refills | Status: AC | PRN

## 2020-06-03 MED ORDER — BENZONATATE 100 MG PO CAPS: 100.0000 mg | ORAL_CAPSULE | Freq: Two times a day (BID) | ORAL | 0 refills | Status: AC | PRN

## 2020-06-03 NOTE — Telephone Encounter (Signed)
Okay for refill? Please advise 

## 2020-06-03 NOTE — Telephone Encounter (Signed)
[name] from [facility] needs a PRN for the patient. She is wheezing, bad cough, and coughing up phlegm.   She is requesting an order for Mucinex  Please fax order to [phone] ATTN: [name]  Phone number: [phone]

## 2020-06-03 NOTE — Progress Notes (Signed)
Virtual Visit via Video Note  I connected with Angelica [name]  on 06/03/20 at  4:00 PM EDT by a video enabled telemedicine application and verified that I am speaking with the correct person using two identifiers.  Location patient: home, [place] Location provider:work or home office Persons participating in the virtual visit: patient, provider  I discussed the limitations of evaluation and management by telemedicine and the availability of in person appointments. The patient expressed understanding and agreed to proceed.   HPI:  Acute phone visit for a cough: -started 3 days ago -symptoms: cough, some wheezing, drainage in the throat, nasal congestion, has some SOB at baseline - but a little worse than baseline the last few days -denies fevers, malaise, chills, body aches, diarrhea, vomiting -has PMH bronchitis, allergic rhinits -fully vaccinated for COVID19 -denies any known sick contacts -denies being outside much lately -reports her PCP usually gives her prednisone for these symptoms -reports takes her advair daily, but is not using the ventolin that is on her list  ROS: See pertinent positives and negatives per HPI.  Past Medical History:  Diagnosis Date  . Allergic rhinitis   . Allergy   . Anxiety   . Blood transfusion   . Cataract   . Chronic back pain    sees Dr. Odette Fraction at [facility]   . COPD (chronic obstructive pulmonary disease) (HCC)   . Depression   . Diverticulosis    hx  . Gallstones   . GERD (gastroesophageal reflux disease)   . Heart murmur   . Hiatal hernia   . HLD (hyperlipidemia)   . HTN (hypertension)   . Irregular cardiac rhythm   . Osteoarthritis   . Osteoporosis   . PUD (peptic ulcer disease)    duodenal 1980s    Past Surgical History:  Procedure Laterality Date  . CERVICAL LAMINECTOMY  8/07   Dr. Venetia Maxon  . CHOLECYSTECTOMY    . COLONOSCOPY  05/28/2008   per Dr. Leone Payor, diverticulosis, adenomatous polyps, repeat in 3 yrs   . HERNIA  REPAIR Right   . L shoulder surgery  2007   Dr. Teressa Senter  . LUMBAR DISC SURGERY  1986    Family History  Problem Relation Age of Onset  . Heart disease Father   . Cancer - Cervical Daughter   . Alzheimer's disease Mother   . Colon cancer Neg Hx   . Esophageal cancer Neg Hx   . Stomach cancer Neg Hx   . Rectal cancer Neg Hx     SOCIAL HX: see hpi   Current Outpatient Medications:  .  alprazolam (XANAX) 2 MG tablet, TAKE 1 TABLET BY MOUTH AT BEDTIME AS NEEDED FOR SLEEP, Disp: 30 tablet, Rfl: 5 .  aspirin 81 MG EC tablet, Take 81 mg by mouth daily.  , Disp: , Rfl:  .  atenolol (TENORMIN) 25 MG tablet, TAKE 1 TABLET EVERY DAY, Disp: 90 tablet, Rfl: 3 .  baclofen (LIORESAL) 10 MG tablet, TAKE 1 TABLET THREE TIMES DAILY AS NEEDED, Disp: 270 tablet, Rfl: 3 .  cetirizine-pseudoephedrine (ZYRTEC-D) 5-120 MG per tablet, Take 1 tablet by mouth 2 (two) times daily., Disp: , Rfl:  .  diclofenac sodium (VOLTAREN) 1 % GEL, Apply 1 application topically every 6 (six) hours as needed (knee pain )., Disp: 100 g, Rfl: 11 .  donepezil (ARICEPT) 10 MG tablet, Take 1 tablet daily, Disp: 90 tablet, Rfl: 5 .  fluticasone-salmeterol (ADVAIR HFA) 230-21 MCG/ACT inhaler, Inhale 2 puffs into the lungs 2 (  two) times daily., Disp: 1 Inhaler, Rfl: 11 .  gabapentin (NEURONTIN) 100 MG capsule, TAKE 1 CAPSULE (100 MG TOTAL) BY MOUTH 3 (THREE) TIMES DAILY., Disp: 270 capsule, Rfl: 3 .  ibuprofen (ADVIL,MOTRIN) 800 MG tablet, Take 1 tablet (800 mg total) by mouth every 8 (eight) hours as needed., Disp: 270 tablet, Rfl: 3 .  loratadine (CLARITIN) 10 MG tablet, Take 1 tablet (10 mg total) by mouth daily., Disp: 30 tablet, Rfl: 11 .  omeprazole (PRILOSEC) 20 MG capsule, Take 1 capsule (20 mg total) by mouth daily., Disp: 30 capsule, Rfl: 3 .  [START ON 06/28/2020] Oxycodone HCl 20 MG TABS, Take 1 tablet (20 mg total) by mouth every 4 (four) hours., Disp: 180 tablet, Rfl: 0 .  polyethylene glycol powder (GLYCOLAX/MIRALAX)  powder, Take 17 g by mouth daily., Disp: , Rfl:  .  promethazine (PHENERGAN) 25 MG tablet, Take 1 tablet (25 mg total) by mouth every 4 (four) hours as needed for nausea., Disp: 180 tablet, Rfl: 3 .  sertraline (ZOLOFT) 100 MG tablet, TAKE 1 TABLET EVERY DAY, Disp: 90 tablet, Rfl: 3 .  valACYclovir (VALTREX) 1000 MG tablet, Take 1 tablet (1,000 mg total) by mouth 3 (three) times daily., Disp: 30 tablet, Rfl: 0 .  VENTOLIN HFA 108 (90 Base) MCG/ACT inhaler, Inhale 2 puffs into the lungs every 4 (four) hours as needed for shortness of breath., Disp: 1 g, Rfl: 11 .  albuterol (VENTOLIN HFA) 108 (90 Base) MCG/ACT inhaler, Inhale 2 puffs into the lungs every 4 (four) hours as needed for wheezing or shortness of breath., Disp: 8 g, Rfl: 1 .  benzonatate (TESSALON) 100 MG capsule, Take 1 capsule (100 mg total) by mouth 2 (two) times daily as needed for cough., Disp: 20 capsule, Rfl: 0 .  predniSONE (DELTASONE) 20 MG tablet, Take 2 tablets (40 mg total) by mouth daily with breakfast., Disp: 8 tablet, Rfl: 0  EXAM:  VITALS per patient if applicable:  GENERAL: alert, oriented, appears well and in no acute distress  HEENT: atraumatic, conjunttiva clear, no obvious abnormalities on inspection of external nose and ears  NECK: normal movements of the head and neck  LUNGS: on inspection no signs of respiratory distress, breathing rate appears normal, no obvious gross SOB, gasping or wheezing  CV: no obvious cyanosis  MS: moves all visible extremities without noticeable abnormality  PSYCH/NEURO: pleasant and cooperative, no obvious depression or anxiety, speech and thought processing grossly intact  ASSESSMENT AND PLAN:  Discussed the following assessment and plan:  Acute bronchitis with COPD (HCC) - Plan: predniSONE (DELTASONE) 20 MG tablet, benzonatate (TESSALON) 100 MG capsule, albuterol (VENTOLIN HFA) 108 (90 Base) MCG/ACT inhaler  -we discussed possible serious and likely etiologies, options  for evaluation and workup, limitations of telemedicine visit vs in person visit, treatment, treatment risks and precautions. Pt prefers to treat via telemedicine empirically rather then risking or undertaking an in person visit at this moment. Advised office staff to contact ALF pharmacy to provide Rx for ventolin inhaler on her med list to use 2 puffs every 4-6 hours prn wheezing or SOB, prednisone burst  40mg  dialy x 4 days and tessalon 100mg  bid prn cough, #20, 0RF on all. Advise of potential viral, irritant or other causes/triggers. Advised prompt inperson evaluation if worsening, new symptoms arise, or if is not improving with treatment. She is fully vaccinated for COVID19 and denies any exposures or sick contacts. Did discuss vaccine efficacy in light of Delta variant rise in the last  week.   I discussed the assessment and treatment plan with the patient. The patient was provided an opportunity to ask questions and all were answered. The patient agreed with the plan and demonstrated an understanding of the instructions.   The patient was advised to call back or seek an in-person evaluation if the symptoms worsen or if the condition fails to improve as anticipated.   Terressa Koyanagi, DO

## 2020-06-04 MED ORDER — GUAIFENESIN ER 1200 MG PO TB12: ORAL_TABLET | ORAL | 5 refills | Status: DC

## 2020-06-04 NOTE — Telephone Encounter (Signed)
Call in Mucinex 1200 mg to take BID as needed for head or chest congestion, #60 with 5 rf

## 2020-06-04 NOTE — Telephone Encounter (Signed)
Left message to return phone call.

## 2020-06-04 NOTE — Addendum Note (Signed)
Addended by: Waymon Amato R on: 06/04/2020 11:01 AM   Modules accepted: Orders

## 2020-06-04 NOTE — Addendum Note (Signed)
Addended by: Waymon Amato R on: 06/04/2020 09:21 AM   Modules accepted: Orders

## 2020-06-04 NOTE — Telephone Encounter (Signed)
Rx filled

## 2020-06-04 NOTE — Addendum Note (Signed)
Addended by: Waymon Amato R on: 06/04/2020 12:09 PM   Modules accepted: Orders

## 2020-06-07 NOTE — Telephone Encounter (Signed)
Rx was sent in electronically. Tried to call pt again to advise of update but no answer. Closing note.

## 2020-07-01 ENCOUNTER — Telehealth: Payer: Self-pay | Admitting: Family Medicine

## 2020-07-01 NOTE — Telephone Encounter (Signed)
Carissa from the pt facility stated the pt takes Oxycodone 20mg  every four hours and the pt request XANAX at night. The pt slurs very bad, drools uncontrollably, sleeps all the time, and seems completely zoned out. would like to know PCP thoughts on this?     [phone] ask for [facility]

## 2020-07-02 NOTE — Telephone Encounter (Signed)
Left message for Angelica [name] to call back

## 2020-07-02 NOTE — Telephone Encounter (Signed)
I understand. Ask them to decrease the Oxycodone to every 6 hours (max of 4 a day)

## 2020-07-02 NOTE — Telephone Encounter (Signed)
Spoke with carissa. She is aware of the medication changes. Nothing further needed.

## 2020-07-05 ENCOUNTER — Ambulatory Visit: Payer: Medicare HMO | Admitting: Family Medicine

## 2020-07-09 ENCOUNTER — Encounter: Payer: Self-pay | Admitting: Family Medicine

## 2020-07-09 ENCOUNTER — Telehealth: Payer: Self-pay | Admitting: Family Medicine

## 2020-07-09 ENCOUNTER — Ambulatory Visit (INDEPENDENT_AMBULATORY_CARE_PROVIDER_SITE_OTHER): Payer: Medicare HMO | Admitting: Family Medicine

## 2020-07-09 ENCOUNTER — Other Ambulatory Visit: Payer: Self-pay

## 2020-07-09 VITALS — BP 118/60 | HR 73 | Temp 98.3°F | Wt 130.2 lb

## 2020-07-09 DIAGNOSIS — R1013 Epigastric pain: Secondary | ICD-10-CM

## 2020-07-09 DIAGNOSIS — K219 Gastro-esophageal reflux disease without esophagitis: Secondary | ICD-10-CM

## 2020-07-09 MED ORDER — OMEPRAZOLE 40 MG PO CPDR
40.0000 mg | DELAYED_RELEASE_CAPSULE | Freq: Every morning | ORAL | 3 refills | Status: AC
Start: 2020-07-09 — End: ?

## 2020-07-09 MED ORDER — FAMOTIDINE 40 MG PO TABS
40.0000 mg | ORAL_TABLET | Freq: Every day | ORAL | 3 refills | Status: DC
Start: 2020-07-09 — End: 2020-09-27

## 2020-07-09 NOTE — Telephone Encounter (Signed)
Per the patient and her daughter Angelica [name], after we recently cut her Oxycodone dosing back to every 6 hours, her pain levels have gone up quite a bit. We all agreed to increase the dosing back to every 4 hours. Please call her nursing service to notify them that her Oxycodone should again be given every 4 hours

## 2020-07-09 NOTE — Progress Notes (Signed)
   Subjective:    Patient ID: Angelica [name], female    DOB: 1941/05/15, 79 y.o.   MRN: [id number]  HPI Here with her daughter Angelica [name] for upper abdominal pains, increased heartburn, belching, and occasional dark red blood in the stools for the past 2 months. No nausea or vomiting. No fever. Her weight has been stable and her appetite is good.  She takes Omeprazole 20 mg each morning. She has a hx of ulcers.   Review of Systems  Constitutional: Negative.   Respiratory: Negative.   Cardiovascular: Negative.   Gastrointestinal: Positive for abdominal pain and blood in stool. Negative for abdominal distention, anal bleeding, constipation, diarrhea, nausea, rectal pain and vomiting.  Genitourinary: Negative.        Objective:   Physical Exam Constitutional:      Appearance: Normal appearance. She is well-developed. She is not ill-appearing.  Cardiovascular:     Rate and Rhythm: Normal rate and regular rhythm.     Pulses: Normal pulses.     Heart sounds: Normal heart sounds.  Pulmonary:     Effort: Pulmonary effort is normal.     Breath sounds: Normal breath sounds.  Abdominal:     General: Abdomen is flat. Bowel sounds are normal. There is no distension.     Palpations: Abdomen is soft. There is no mass.     Tenderness: There is no guarding or rebound.     Hernia: No hernia is present.     Comments: Mildly tender across the entire upper abdomen  Neurological:     Mental Status: She is alert.           Assessment & Plan:  Likely gastritis or ulcer disease. Increase Omeprazole to 40 mg every morning and add Pepcid 40 mg every evening. Recheck if not better in one week. Gershon Crane, MD

## 2020-07-12 ENCOUNTER — Telehealth: Payer: Self-pay | Admitting: Family Medicine

## 2020-07-12 MED ORDER — OXYCODONE HCL 20 MG PO TABS: 1.0000 | ORAL_TABLET | ORAL | 0 refills | Status: AC

## 2020-07-12 NOTE — Telephone Encounter (Signed)
Marcheta Grammes has been made aware. Nothing further needed.

## 2020-07-12 NOTE — Telephone Encounter (Signed)
I sent in the RX  

## 2020-07-12 NOTE — Telephone Encounter (Signed)
Spoke with [facility]. She is aware of the medication changes. She stated that a new order needs to be sent over for this prescription. Please advise.

## 2020-07-12 NOTE — Telephone Encounter (Signed)
Called and left a voice mail of confidential voice mail for Angelica [name] explaining that we had recently decreased the patients pain medication and that er Dr. Clent Ridges this should be increased back to q4h PRN. Cal back number given

## 2020-07-12 NOTE — Telephone Encounter (Signed)
Angelica [name] wants a call back. She said she needs to speak with someone in regards to the pt.

## 2020-07-14 NOTE — Telephone Encounter (Signed)
Charissa called back and asked that we send over a new order for this. Dr. Clent Ridges sent in a new prescription. Nothing further needed.

## 2020-07-27 ENCOUNTER — Telehealth: Payer: Self-pay | Admitting: Family Medicine

## 2020-07-27 NOTE — Telephone Encounter (Signed)
[name] from [facility] of Angelica [name] [phone]  Needs a hard script or a phone call to the pharmacy for  alprazolam [name]) 2 MG tablet   [facility] [place], [place] - [address] [phone] PHONE [phone] FAX

## 2020-07-28 MED ORDER — ALPRAZOLAM 2 MG PO TABS: 2.0000 mg | ORAL_TABLET | Freq: Every evening | ORAL | 5 refills | Status: DC | PRN

## 2020-07-28 NOTE — Telephone Encounter (Signed)
The rx is ready to fax  

## 2020-07-28 NOTE — Addendum Note (Signed)
Addended by: Gershon Crane A on: 07/28/2020 08:04 AM   Modules accepted: Orders

## 2020-07-28 NOTE — Addendum Note (Signed)
Addended by: Gershon Crane A on: 07/28/2020 07:48 AM   Modules accepted: Orders

## 2020-07-28 NOTE — Telephone Encounter (Signed)
Rx faxed

## 2020-08-03 ENCOUNTER — Telehealth: Payer: Self-pay | Admitting: Family Medicine

## 2020-08-03 NOTE — Progress Notes (Signed)
°  Chronic Care Management   Outreach Note  08/03/2020 Name: [name] MRN: [id number] DOB: [birth year]  Referred by: Nelwyn Salisbury, MD Reason for referral : No chief complaint on file.   An unsuccessful telephone outreach was attempted today. The patient was referred to the pharmacist for assistance with care management and care coordination.   Follow Up Plan:   Carley Perdue [employer] Scheduler

## 2020-08-16 ENCOUNTER — Telehealth: Payer: Self-pay | Admitting: Family Medicine

## 2020-08-16 NOTE — Progress Notes (Signed)
  Chronic Care Management   Outreach Note  08/16/2020 Name: ELDINE RENCHER MRN: [id number] DOB: 1941-05-04  Referred by: Nelwyn Salisbury, MD Reason for referral : No chief complaint on file.   A second unsuccessful telephone outreach was attempted today. The patient was referred to pharmacist for assistance with care management and care coordination.  Follow Up Plan:   Carley Perdue [employer] Scheduler

## 2020-08-23 ENCOUNTER — Telehealth: Payer: Self-pay | Admitting: Family Medicine

## 2020-08-23 NOTE — Telephone Encounter (Signed)
Left message for patient to schedule Annual Wellness Visit.  Please schedule with Nurse Health Advisor Shannon Crews, RN at  [facility]  

## 2020-08-24 ENCOUNTER — Telehealth: Payer: Self-pay | Admitting: Family Medicine

## 2020-08-24 NOTE — Progress Notes (Signed)
  Chronic Care Management   Outreach Note  08/24/2020 Name: [name] MRN: [id number] DOB: 1941/06/28  Referred by: Nelwyn Salisbury, MD Reason for referral : No chief complaint on file.   Third unsuccessful telephone outreach was attempted today. The patient was referred to the pharmacist for assistance with care management and care coordination.   Follow Up Plan:   Carley Perdue [employer] Scheduler

## 2020-08-31 ENCOUNTER — Telehealth: Payer: Self-pay | Admitting: Family Medicine

## 2020-08-31 NOTE — Telephone Encounter (Signed)
Patient could not hear me speaking.  I will CB

## 2020-09-03 ENCOUNTER — Telehealth: Payer: Self-pay | Admitting: Family Medicine

## 2020-09-03 MED ORDER — AZITHROMYCIN 250 MG PO TABS: ORAL_TABLET | ORAL | 0 refills | Status: DC

## 2020-09-03 NOTE — Telephone Encounter (Signed)
Angelica [name] called, left VM that the Z-Pack has been sent in to the pharmacy, call back if any questions.

## 2020-09-03 NOTE — Telephone Encounter (Signed)
Brennan Bailey called and asked about the patient needing a Z-Pack. She says that for about 3 days she's been coughing with chest congestion and Dr. Clent Ridges has called her in a z-pack in the past. I advised I will send this request to Dr. Clent Ridges and will call back with his recommendation.

## 2020-09-03 NOTE — Telephone Encounter (Signed)
Please call in a Zpack  °

## 2020-09-03 NOTE — Telephone Encounter (Signed)
Brennan Bailey [facility]) [phone]  Called to see if Dr. Clent Ridges can call in something for the patient. The patient has this chest thing going on and she is very easy to get bronchitis. She was wondering if Dr. Clent Ridges can call her in a Z-Pack or something.  The patient is in assisted living.   [facility] [place], [place] - [address] Phone:  [phone]  Fax:  [phone]

## 2020-09-17 ENCOUNTER — Telehealth: Payer: Self-pay | Admitting: Family Medicine

## 2020-09-17 MED ORDER — LORATADINE 10 MG PO TABS: 10.0000 mg | ORAL_TABLET | Freq: Every day | ORAL | 11 refills | Status: AC

## 2020-09-17 MED ORDER — MUCINEX MAXIMUM STRENGTH 1200 MG PO TB12: ORAL_TABLET | ORAL | 11 refills | Status: DC

## 2020-09-17 NOTE — Addendum Note (Signed)
Addended by: Gershon Crane A on: 09/17/2020 04:57 PM   Modules accepted: Orders

## 2020-09-17 NOTE — Telephone Encounter (Signed)
Patient is needing refills of Mucinex, Loratadine, and Potassium [facility] [place]. Patient is also asking if she can take 1 dose of Loratadine in the morning and 1 at night for her allergies. Please call Corrie Dandy at [phone].

## 2020-09-17 NOTE — Telephone Encounter (Signed)
I refilled Loratidine (she should only take this ONCE a day) and Mucinex. She is NOT currently taking potassium

## 2020-09-17 NOTE — Telephone Encounter (Signed)
Please advise 

## 2020-09-23 ENCOUNTER — Telehealth: Payer: Self-pay | Admitting: Family Medicine

## 2020-09-23 NOTE — Telephone Encounter (Signed)
Patient left assisted living and [facility] not [facility] because it's against their protocol.   Brennan Bailey will call back to request the rest of her medication that need refilled and [facility]    alprazolam [name]) 2 MG tablet   [id number] [place], [place] - [address] Phone:  [phone]  Fax:  [phone]

## 2020-09-24 NOTE — Telephone Encounter (Signed)
Attempted to contact patient. No answer and unable to LVM as her voicemail is full

## 2020-09-24 NOTE — Telephone Encounter (Signed)
Please advise 

## 2020-09-24 NOTE — Telephone Encounter (Signed)
No, she already has refills until Feb 2022

## 2020-09-27 ENCOUNTER — Encounter: Payer: Self-pay | Admitting: Family Medicine

## 2020-09-27 ENCOUNTER — Telehealth (INDEPENDENT_AMBULATORY_CARE_PROVIDER_SITE_OTHER): Payer: Medicare HMO | Admitting: Family Medicine

## 2020-09-27 VITALS — Ht 62.0 in | Wt 129.0 lb

## 2020-09-27 DIAGNOSIS — F1190 Opioid use, unspecified, uncomplicated: Secondary | ICD-10-CM

## 2020-09-27 DIAGNOSIS — M48061 Spinal stenosis, lumbar region without neurogenic claudication: Secondary | ICD-10-CM | POA: Diagnosis not present

## 2020-09-27 DIAGNOSIS — F0390 Unspecified dementia without behavioral disturbance: Secondary | ICD-10-CM | POA: Diagnosis not present

## 2020-09-27 DIAGNOSIS — F03A0 Unspecified dementia, mild, without behavioral disturbance, psychotic disturbance, mood disturbance, and anxiety: Secondary | ICD-10-CM

## 2020-09-27 MED ORDER — ADVAIR HFA 230-21 MCG/ACT IN AERO: 2.0000 | INHALATION_SPRAY | Freq: Two times a day (BID) | RESPIRATORY_TRACT | 3 refills | Status: AC

## 2020-09-27 MED ORDER — ALPRAZOLAM 2 MG PO TABS: 2.0000 mg | ORAL_TABLET | Freq: Every evening | ORAL | 1 refills | Status: AC | PRN

## 2020-09-27 MED ORDER — DONEPEZIL HCL 10 MG PO TABS: ORAL_TABLET | ORAL | 3 refills | Status: AC

## 2020-09-27 MED ORDER — OXYCODONE HCL 20 MG PO TABS: 1.0000 | ORAL_TABLET | ORAL | 0 refills | Status: DC | PRN

## 2020-09-27 MED ORDER — OXYCODONE HCL 20 MG PO TABS: 1.0000 | ORAL_TABLET | ORAL | 0 refills | Status: AC | PRN

## 2020-09-27 NOTE — Progress Notes (Signed)
   Subjective:    Patient ID: Angelica [name], female    DOB: October 24, 1941, 79 y.o.   MRN: [id number]  HPI Virtual Visit via Telephone Note  I connected with the patient on 09/27/20 at  2:00 PM EST by telephone and verified that I am speaking with the correct person using two identifiers.   I discussed the limitations, risks, security and privacy concerns of performing an evaluation and management service by telephone and the availability of in person appointments. I also discussed with the patient that there may be a patient responsible charge related to this service. The patient expressed understanding and agreed to proceed.  Location patient: home Location provider: work or home office Participants present for the call: patient, provider Patient did not have a visit in the prior 7 days to address this/these issue(s).   History of Present Illness: Here for pain management. She is doing well.  Indication for chronic opioid: low back pain Medication and dose: Oxycodone 20 mg # pills per month: 180 Last UDS date: 02-27-20 Opioid Treatment Agreement signed (Y/N): 12-05-18 Opioid Treatment Agreement last reviewed with patient:  09-27-20 NCCSRS reviewed this encounter (include red flags): Yes     Observations/Objective: Patient sounds cheerful and well on the phone. I do not appreciate any SOB. Speech and thought processing are grossly intact. Patient reported vitals:  Assessment and Plan: Pain management, meds were refilled.  Gershon Crane, MD   Follow Up Instructions:     [phone] 5-10 [phone] 11-20 [phone] 21-30 I did not refer this patient for an OV in the next 24 hours for this/these issue(s).  I discussed the assessment and treatment plan with the patient. The patient was provided an opportunity to ask questions and all were answered. The patient agreed with the plan and demonstrated an understanding of the instructions.   The patient was advised to call back or seek an  in-person evaluation if the symptoms worsen or if the condition fails to improve as anticipated.  I provided 14 minutes of non-face-to-face time during this encounter.   Gershon Crane, MD    Review of Systems     Objective:   Physical Exam        Assessment & Plan:

## 2020-10-27 ENCOUNTER — Telehealth: Payer: Self-pay | Admitting: Family Medicine

## 2020-10-27 MED ORDER — IBUPROFEN 800 MG PO TABS: 800.0000 mg | ORAL_TABLET | Freq: Three times a day (TID) | ORAL | 3 refills | Status: AC | PRN

## 2020-10-27 NOTE — Telephone Encounter (Signed)
Rx sent in

## 2020-10-27 NOTE — Telephone Encounter (Signed)
Patient POA is calling and requesting a refill for ibuprofen (ADVIL,MOTRIN) 800 MG tablet [id number] [place], [place] [address] [place] [address]  Phone:  [phone] Fax:  [phone] CB is [phone]

## 2020-11-04 ENCOUNTER — Telehealth: Payer: Self-pay | Admitting: Family Medicine

## 2020-11-04 NOTE — Telephone Encounter (Signed)
Pts POA and Durable POA Angelica [name]) is calling in to check the status of the FL-2 so that the pt is able to move in to the facility by the end of the year 12/14/2019.  Angelica [name] stated that [name] has faxed over the form and would like to see if it can be faxed over as soon as possible.  Angelica [name] is aware that Dr. Clent Ridges has been out of the office and returned on 11/02/2020.  Angelica [name] would like to have a call back when it has been faxed to the facility.

## 2020-11-04 NOTE — Telephone Encounter (Signed)
LM notifying that form has been faxed

## 2020-11-08 ENCOUNTER — Telehealth: Payer: Self-pay | Admitting: Family Medicine

## 2020-11-08 MED ORDER — GABAPENTIN 100 MG PO CAPS: ORAL_CAPSULE | ORAL | 3 refills | Status: AC

## 2020-11-08 NOTE — Telephone Encounter (Signed)
Rx sent in. Have you see this form?

## 2020-11-08 NOTE — Telephone Encounter (Signed)
Patient daughter is calling and requesting a refill for gabapentin (NEURONTIN) 100 MG capsule [id number]  [address] [place] [address]  Phone:  [phone] Fax:  [phone] Also patient's daughter stated that the facility stated that they haven't received FL2 form and if it could be faxed to [phone]. Make form attention to [facility]. CB is [phone]

## 2020-11-09 NOTE — Telephone Encounter (Signed)
I filled this out last week.

## 2020-11-09 NOTE — Telephone Encounter (Signed)
Patient's POA Corrie Dandy ) is calling and wanted to let provider know that [employer] [place] form has not been received and if it can be faxed again to [phone], please advise. CB is [phone]

## 2020-11-11 ENCOUNTER — Telehealth: Payer: Self-pay | Admitting: Family Medicine

## 2020-11-11 NOTE — Telephone Encounter (Signed)
Left message for patient to call back and schedule Medicare Annual Wellness Visit (AWV) either virtually or in office.   Last AWV 02/19/18 please schedule at anytime with [facility] Nurse Health Advisor 1 or 2   This should be a 45 minute visit.

## 2020-11-15 ENCOUNTER — Telehealth: Payer: Self-pay | Admitting: Family Medicine

## 2020-11-15 NOTE — Telephone Encounter (Signed)
Angelica [name] is calling in stating that would like to have a verification on pts Oxycodone it was written on the FL2 as 10 MG  but the pt arrived with a bottle stating 20 MG the facility would like to know what dosage Dr. Clent Ridges would like for the pt to be on.  And they stated that the POA said that the pt has always been on 20 MG.  [name] would like to have a call back she is aware that the provider is not in the office but will be back on 11/16/2020.

## 2020-11-16 DIAGNOSIS — R262 Difficulty in walking, not elsewhere classified: Secondary | ICD-10-CM | POA: Diagnosis not present

## 2020-11-16 DIAGNOSIS — J440 Chronic obstructive pulmonary disease with acute lower respiratory infection: Secondary | ICD-10-CM | POA: Diagnosis not present

## 2020-11-16 DIAGNOSIS — J438 Other emphysema: Secondary | ICD-10-CM | POA: Diagnosis not present

## 2020-11-17 DIAGNOSIS — J438 Other emphysema: Secondary | ICD-10-CM | POA: Diagnosis not present

## 2020-11-17 DIAGNOSIS — R262 Difficulty in walking, not elsewhere classified: Secondary | ICD-10-CM | POA: Diagnosis not present

## 2020-11-17 DIAGNOSIS — I10 Essential (primary) hypertension: Secondary | ICD-10-CM | POA: Diagnosis not present

## 2020-11-17 DIAGNOSIS — E785 Hyperlipidemia, unspecified: Secondary | ICD-10-CM | POA: Diagnosis not present

## 2020-11-17 DIAGNOSIS — M48061 Spinal stenosis, lumbar region without neurogenic claudication: Secondary | ICD-10-CM | POA: Diagnosis not present

## 2020-11-17 DIAGNOSIS — J449 Chronic obstructive pulmonary disease, unspecified: Secondary | ICD-10-CM | POA: Diagnosis not present

## 2020-11-17 DIAGNOSIS — J440 Chronic obstructive pulmonary disease with acute lower respiratory infection: Secondary | ICD-10-CM | POA: Diagnosis not present

## 2020-11-17 DIAGNOSIS — K219 Gastro-esophageal reflux disease without esophagitis: Secondary | ICD-10-CM | POA: Diagnosis not present

## 2020-11-17 DIAGNOSIS — G8929 Other chronic pain: Secondary | ICD-10-CM | POA: Diagnosis not present

## 2020-11-17 DIAGNOSIS — F0390 Unspecified dementia without behavioral disturbance: Secondary | ICD-10-CM | POA: Diagnosis not present

## 2020-11-17 NOTE — Telephone Encounter (Signed)
Spoke with [name] and informed her of the message below.

## 2020-11-17 NOTE — Telephone Encounter (Signed)
The Oxycodone should be 20 mg every 4 hours as needed

## 2020-11-19 DIAGNOSIS — F0390 Unspecified dementia without behavioral disturbance: Secondary | ICD-10-CM | POA: Diagnosis not present

## 2020-11-19 DIAGNOSIS — J449 Chronic obstructive pulmonary disease, unspecified: Secondary | ICD-10-CM | POA: Diagnosis not present

## 2020-11-19 DIAGNOSIS — F419 Anxiety disorder, unspecified: Secondary | ICD-10-CM | POA: Diagnosis not present

## 2020-11-23 DIAGNOSIS — J440 Chronic obstructive pulmonary disease with acute lower respiratory infection: Secondary | ICD-10-CM | POA: Diagnosis not present

## 2020-11-23 DIAGNOSIS — J438 Other emphysema: Secondary | ICD-10-CM | POA: Diagnosis not present

## 2020-11-23 DIAGNOSIS — R262 Difficulty in walking, not elsewhere classified: Secondary | ICD-10-CM | POA: Diagnosis not present

## 2020-11-26 DIAGNOSIS — R262 Difficulty in walking, not elsewhere classified: Secondary | ICD-10-CM | POA: Diagnosis not present

## 2020-11-26 DIAGNOSIS — J440 Chronic obstructive pulmonary disease with acute lower respiratory infection: Secondary | ICD-10-CM | POA: Diagnosis not present

## 2020-11-26 DIAGNOSIS — Z23 Encounter for immunization: Secondary | ICD-10-CM | POA: Diagnosis not present

## 2020-11-26 DIAGNOSIS — J438 Other emphysema: Secondary | ICD-10-CM | POA: Diagnosis not present

## 2020-12-09 DIAGNOSIS — U071 COVID-19: Secondary | ICD-10-CM | POA: Diagnosis not present

## 2020-12-23 DIAGNOSIS — U071 COVID-19: Secondary | ICD-10-CM | POA: Diagnosis not present

## 2020-12-23 DIAGNOSIS — F419 Anxiety disorder, unspecified: Secondary | ICD-10-CM | POA: Diagnosis not present

## 2020-12-23 DIAGNOSIS — J441 Chronic obstructive pulmonary disease with (acute) exacerbation: Secondary | ICD-10-CM | POA: Diagnosis not present

## 2020-12-23 DIAGNOSIS — F0390 Unspecified dementia without behavioral disturbance: Secondary | ICD-10-CM | POA: Diagnosis not present

## 2021-01-25 DIAGNOSIS — H26493 Other secondary cataract, bilateral: Secondary | ICD-10-CM | POA: Diagnosis not present

## 2021-01-25 DIAGNOSIS — Z7951 Long term (current) use of inhaled steroids: Secondary | ICD-10-CM | POA: Diagnosis not present

## 2021-01-25 DIAGNOSIS — Z961 Presence of intraocular lens: Secondary | ICD-10-CM | POA: Diagnosis not present

## 2021-01-25 DIAGNOSIS — H04123 Dry eye syndrome of bilateral lacrimal glands: Secondary | ICD-10-CM | POA: Diagnosis not present

## 2021-01-25 DIAGNOSIS — H524 Presbyopia: Secondary | ICD-10-CM | POA: Diagnosis not present

## 2021-01-31 NOTE — Progress Notes (Signed)
Subjective:   Angelica [name] is a 80 y.o. female who presents for Medicare Annual (Subsequent) preventive examination.  I connected with Angelica [name] today by telephone and verified that I am speaking with the correct person using two identifiers. Location patient: home Location provider: work Persons participating in the virtual visit: patient, provider.   I discussed the limitations, risks, security and privacy concerns of performing an evaluation and management service by telephone and the availability of in person appointments. I also discussed with the patient that there may be a patient responsible charge related to this service. The patient expressed understanding and verbally consented to this telephonic visit.    Interactive audio and video telecommunications were attempted between this provider and patient, however failed, due to patient having technical difficulties OR patient did not have access to video capability.  We continued and completed visit with audio only.      Review of Systems    N/A  Cardiac Risk Factors include: advanced age (>75men, >63 women);hypertension     Objective:    Today's Vitals   There is no height or weight on file to calculate BMI.  Advanced Directives 02/01/2021 02/19/2018 01/18/2013 03/26/2012 03/26/2012  Does Patient Have a Medical Advance Directive? Yes No Patient has advance directive, copy not in chart Patient has advance directive, copy not in chart Patient does not have advance directive  Type of Advance Directive Healthcare Power of [place];Living will - Living will Healthcare Power of [place];Living will -  Copy of Healthcare Power of Attorney in Chart? Yes - validated most recent copy scanned in chart (See row information) - Copy requested from family Copy requested from family -  Pre-existing out of facility DNR order (yellow form or pink MOST form) - - No Yes, notify physician for inpatient order No    Current Medications  (verified) Outpatient Encounter Medications as of 02/01/2021  Medication Sig  . albuterol (VENTOLIN HFA) 108 (90 Base) MCG/ACT inhaler Inhale 2 puffs into the lungs every 4 (four) hours as needed for wheezing or shortness of breath.  . alprazolam (XANAX) 2 MG tablet Take 1 tablet (2 mg total) by mouth at bedtime as needed.  Marland Kitchen aspirin 81 MG EC tablet Take 81 mg by mouth daily.  Marland Kitchen atenolol (TENORMIN) 25 MG tablet TAKE 1 TABLET EVERY DAY  . baclofen (LIORESAL) 10 MG tablet TAKE 1 TABLET THREE TIMES DAILY AS NEEDED  . benzonatate (TESSALON) 100 MG capsule Take 1 capsule (100 mg total) by mouth 2 (two) times daily as needed for cough.  . cetirizine-pseudoephedrine (ZYRTEC-D) 5-120 MG per tablet Take 1 tablet by mouth 2 (two) times daily.  . diclofenac sodium (VOLTAREN) 1 % GEL Apply 1 application topically every 6 (six) hours as needed (knee pain ).  . donepezil (ARICEPT) 10 MG tablet Take 1 tablet daily  . fluticasone-salmeterol (ADVAIR HFA) 230-21 MCG/ACT inhaler Inhale 2 puffs into the lungs 2 (two) times daily.  Marland Kitchen gabapentin (NEURONTIN) 100 MG capsule TAKE 1 CAPSULE (100 MG TOTAL) BY MOUTH 3 (THREE) TIMES DAILY.  Marland Kitchen ibuprofen (ADVIL) 800 MG tablet Take 1 tablet (800 mg total) by mouth every 8 (eight) hours as needed.  . loratadine (CLARITIN) 10 MG tablet Take 1 tablet (10 mg total) by mouth daily.  Marland Kitchen omeprazole (PRILOSEC) 40 MG capsule Take 1 capsule (40 mg total) by mouth in the morning.  . polyethylene glycol powder (GLYCOLAX/MIRALAX) powder Take 17 g by mouth daily.  . promethazine (PHENERGAN) 25 MG tablet Take 1  tablet (25 mg total) by mouth every 4 (four) hours as needed for nausea.  . sertraline (ZOLOFT) 100 MG tablet TAKE 1 TABLET EVERY DAY  . VENTOLIN HFA 108 (90 Base) MCG/ACT inhaler Inhale 2 puffs into the lungs every 4 (four) hours as needed for shortness of breath.   No facility-administered encounter medications on file as of 02/01/2021.    Allergies (verified) Codeine and  Penicillins   History: Past Medical History:  Diagnosis Date  . Allergic rhinitis   . Allergy   . Anxiety   . Blood transfusion   . Cataract   . Chronic back pain    sees Dr. Odette Fraction at [facility]   . COPD (chronic obstructive pulmonary disease) (HCC)   . Depression   . Diverticulosis    hx  . Gallstones   . GERD (gastroesophageal reflux disease)   . Heart murmur   . Hiatal hernia   . HLD (hyperlipidemia)   . HTN (hypertension)   . Irregular cardiac rhythm   . Osteoarthritis   . Osteoporosis   . PUD (peptic ulcer disease)    duodenal 1980s   Past Surgical History:  Procedure Laterality Date  . CERVICAL LAMINECTOMY  8/07   Dr. Venetia Maxon  . CHOLECYSTECTOMY    . COLONOSCOPY  06/25/2018   per Dr. Leone Payor, diverticulosis, no polyps, no repeats needed   . HERNIA REPAIR Right   . L shoulder surgery  2007   Dr. Teressa Senter  . LUMBAR DISC SURGERY  1986   Family History  Problem Relation Age of Onset  . Heart disease Father   . Cancer - Cervical Daughter   . Alzheimer's disease Mother   . Colon cancer Neg Hx   . Esophageal cancer Neg Hx   . Stomach cancer Neg Hx   . Rectal cancer Neg Hx    Social History   Socioeconomic History  . Marital status: Widowed    Spouse name: Not on file  . Number of children: 3  . Years of education: Not on file  . Highest education level: Not on file  Occupational History    Employer: RETIRED  Tobacco Use  . Smoking status: Former Smoker    Packs/day: 1.00    Types: Cigarettes  . Smokeless tobacco: Never Used  . Tobacco comment: less than a pack  Substance and Sexual Activity  . Alcohol use: No    Alcohol/week: 0.0 standard drinks  . Drug use: No  . Sexual activity: Never  Other Topics Concern  . Not on file  Social History Narrative   Widowed, 3 children (1B / 2G)   Retired Scientist, physiological   Daily caffeine: None no alcohol no tobacco   Social Determinants of Corporate investment banker Strain: Low Risk   .  Difficulty of Paying Living Expenses: Not hard at all  Food Insecurity: No Food Insecurity  . Worried About Programme researcher, broadcasting/film/video in the Last Year: Never true  . Ran Out of Food in the Last Year: Never true  Transportation Needs: No Transportation Needs  . Lack of Transportation (Medical): No  . Lack of Transportation (Non-Medical): No  Physical Activity: Inactive  . Days of Exercise per Week: 0 days  . Minutes of Exercise per Session: 0 min  Stress: No Stress Concern Present  . Feeling of Stress : Not at all  Social Connections: Socially Isolated  . Frequency of Communication with Friends and Family: More than three times a week  . Frequency of  Social Gatherings with Friends and Family: More than three times a week  . Attends Religious Services: Never  . Active Member of Clubs or Organizations: No  . Attends Banker Meetings: Never  . Marital Status: Widowed    Tobacco Counseling Counseling given: Not Answered Comment: less than a pack   Clinical Intake:  Pre-visit preparation completed: Yes  Pain : No/denies pain     Nutritional Risks: None  How often do you need to have someone help you when you read instructions, pamphlets, or other written materials from your doctor or pharmacy?: 1 - Never What is the last grade level you completed in school?: graduate High School  Diabetic? No      Information entered by :: L.Viers, LPN   Activities of Daily Living In your present state of health, do you have any difficulty performing the following activities: 02/01/2021  Hearing? N  Vision? Y  Difficulty concentrating or making decisions? Y  Walking or climbing stairs? N  Dressing or bathing? Y  Comment pt currently in facility  Doing errands, shopping? Y  Comment pt in facility  Preparing Food and eating ? Y  Comment does not prepare food  Using the Toilet? N  In the past six months, have you accidently leaked urine? Y  Do you have problems with loss of  bowel control? Y  Managing your Medications? Y  Managing your Finances? Y  Housekeeping or managing your Housekeeping? Y  Some recent data might be hidden    Patient Care Team: Nelwyn Salisbury, MD as PCP - General Hanley Seamen, Dustin Folks, MD as Referring Physician (Optometry)  Indicate any recent Medical Services you may have received from other than [facility] providers in the past year (date may be approximate).     Assessment:   This is a routine wellness examination for Angelica [name].  Hearing/Vision screen  Hearing Screening   125Hz  250Hz  500Hz  1000Hz  2000Hz  3000Hz  4000Hz  6000Hz  8000Hz   Right ear:           Left ear:           Vision Screening Comments: Pt had eye exam completed by eye doctor onsite at nursing facility  Dietary issues and exercise activities discussed: Current Exercise Habits: The patient does not participate in regular exercise at present  Goals    . Patient Stated     Long term care planning       Depression Screen PHQ 2/9 Scores 02/01/2021 02/19/2018 04/07/2015  PHQ - 2 Score 0 0 0    Fall Risk Fall Risk  02/01/2021 02/19/2018 09/10/2017 03/12/2017 07/12/2016  Falls in the past year? 0 No Yes Yes Yes  Comment - - - - Emmi Telephone Survey: data to providers prior to load  Number falls in past yr: 0 - 1 2 or more 2 or more  Comment - - - - Emmi Telephone Survey Actual Response = 2  Injury with Fall? - - No No No  Risk for fall due to : Impaired balance/gait - - - -  Follow up Falls prevention discussed;Falls evaluation completed - - - -    FALL RISK PREVENTION PERTAINING TO THE HOME:  Any stairs in or around the home? No  If so, are there any without handrails? No  Home free of loose throw rugs in walkways, pet beds, electrical cords, etc? Yes  Adequate lighting in your home to reduce risk of falls? Yes   ASSISTIVE DEVICES UTILIZED TO PREVENT FALLS:  Life alert?  Yes  Use of a cane, walker or w/c? Yes  Grab bars in the bathroom? Yes  Shower chair or bench in  shower? Yes  Elevated toilet seat or a handicapped toilet? Yes     Cognitive Function: Cognitive status assessed by direct observation. Patient has current diagnosis of cognitive impairment. Marland Kitchen.   MMSE - Mini Mental State Exam 02/19/2018 09/10/2017 03/12/2017  Not completed: Refused - -  Orientation to time - 5 4  Orientation to Place - 5 5  Registration - 3 3  Attention/ Calculation - 5 5  Recall - 0 0  Language- name 2 objects - 2 2  Language- repeat - 1 1  Language- follow 3 step command - 3 3  Language- read & follow direction - 1 1  Write a sentence - 1 1  Copy design - 0 0  Total score - 26 25        Immunizations Immunization History  Administered Date(s) Administered  . Influenza Split 10/04/2012  . Influenza Whole 09/10/2009, 08/12/2010  . Influenza, High Dose Seasonal PF 10/21/2014, 07/19/2016, 10/31/2017  . Influenza,inj,Quad PF,6+ Mos 11/27/2013  . Influenza-Unspecified 09/14/2015, 09/12/2018  . PFIZER(Purple Top)SARS-COV-2 Vaccination 12/30/2019, 01/27/2020  . PPD Test 12/30/2018  . Pneumococcal Conjugate-13 10/21/2014  . Pneumococcal Polysaccharide-23 07/19/2016    TDAP status: Due, Education has been provided regarding the importance of this vaccine. Advised may receive this vaccine at local pharmacy or Health Dept. Aware to provide a copy of the vaccination record if obtained from local pharmacy or Health Dept. Verbalized acceptance and understanding.  Flu Vaccine status: Up to date  Pneumococcal vaccine status: Up to date  Covid-19 vaccine status: Completed vaccines  Qualifies for Shingles Vaccine? Yes   Zostavax completed No   Shingrix Completed?: No.    Education has been provided regarding the importance of this vaccine. Patient has been advised to call insurance company to determine out of pocket expense if they have not yet received this vaccine. Advised may also receive vaccine at local pharmacy or Health Dept. Verbalized acceptance and  understanding.  Screening Tests Health Maintenance  Topic Date Due  . Hepatitis C Screening  Never done  . TETANUS/TDAP  Never done  . DEXA SCAN  Never done  . INFLUENZA VACCINE  06/13/2020  . COVID-19 Vaccine (3 - Booster for Pfizer series) 07/29/2020  . PNA vac Low Risk Adult  Completed  . HPV VACCINES  Aged Out    Health Maintenance  Health Maintenance Due  Topic Date Due  . Hepatitis C Screening  Never done  . TETANUS/TDAP  Never done  . DEXA SCAN  Never done  . INFLUENZA VACCINE  06/13/2020  . COVID-19 Vaccine (3 - Booster for Pfizer series) 07/29/2020    Colorectal cancer screening: No longer required.   Mammogram status: Ordered 02/01/2021. Pt provided with contact info and advised to call to schedule appt.   Bone Density status: Ordered 02/01/2021. Pt provided with contact info and advised to call to schedule appt.  Lung Cancer Screening: (Low Dose CT Chest recommended if Age 51-80 years, 30 pack-year currently smoking OR have quit w/in 15years.) does not qualify.   Lung Cancer Screening Referral: N/A   Additional Screening:  Hepatitis C Screening: does qualify;   Vision Screening: Recommended annual ophthalmology exams for early detection of glaucoma and other disorders of the eye. Is the patient up to date with their annual eye exam?  Yes  Who is the provider or what is the name  of the office in which the patient attends annual eye exams? Unknown, provider at facility If pt is not established with a provider, would they like to be referred to a provider to establish care? No .   Dental Screening: Recommended annual dental exams for proper oral hygiene  Community Resource Referral / Chronic Care Management: CRR required this visit?  No   CCM required this visit?  No      Plan:     I have personally reviewed and noted the following in the patient's chart:   . Medical and social history . Use of alcohol, tobacco or illicit drugs  . Current  medications and supplements . Functional ability and status . Nutritional status . Physical activity . Advanced directives . List of other physicians . Hospitalizations, surgeries, and ER visits in previous 12 months . Vitals . Screenings to include cognitive, depression, and falls . Referrals and appointments  In addition, I have reviewed and discussed with patient certain preventive protocols, quality metrics, and best practice recommendations. A written personalized care plan for preventive services as well as general preventive health recommendations were provided to patient.     Theodora Blow, LPN   [id number]/[id number]   Nurse Notes: None

## 2021-02-01 ENCOUNTER — Other Ambulatory Visit: Payer: Self-pay

## 2021-02-01 ENCOUNTER — Ambulatory Visit (INDEPENDENT_AMBULATORY_CARE_PROVIDER_SITE_OTHER): Payer: Medicare HMO

## 2021-02-01 DIAGNOSIS — Z0000 Encounter for general adult medical examination without abnormal findings: Secondary | ICD-10-CM

## 2021-02-01 NOTE — Patient Instructions (Signed)
Angelica [name] , Thank you for taking time to come for your Medicare Wellness Visit. I appreciate your ongoing commitment to your health goals. Please review the following plan we discussed and let me know if I can assist you in the future.   Screening recommendations/referrals: Colonoscopy: No longer required  Mammogram: Currently due, orders placed this visit Bone Density: Currently due, orders placed this visit  Recommended yearly ophthalmology/optometry visit for glaucoma screening and checkup Recommended yearly dental visit for hygiene and checkup  Vaccinations: Influenza vaccine: Up to date, next due fall 2022  Pneumococcal vaccine: Completed series  Tdap vaccine: Currently due, you may await and injury to receive  Shingles vaccine: Currently due for Shingrix, if you would like to receive we recommend that you do so at your local pharmacy    Advanced directives:  Copies on file   Conditions/risks identified: None   Next appointment: None   Preventive Care 80 Years and Older, Female Preventive care refers to lifestyle choices and visits with your health care provider that can promote health and wellness. What does preventive care include?  A yearly physical exam. This is also called an annual well check.  Dental exams once or twice a year.  Routine eye exams. Ask your health care provider how often you should have your eyes checked.  Personal lifestyle choices, including:  Daily care of your teeth and gums.  Regular physical activity.  Eating a healthy diet.  Avoiding tobacco and drug use.  Limiting alcohol use.  Practicing safe sex.  Taking low-dose aspirin every day.  Taking vitamin and mineral supplements as recommended by your health care provider. What happens during an annual well check? The services and screenings done by your health care provider during your annual well check will depend on your age, overall health, lifestyle risk factors, and family  history of disease. Counseling  Your health care provider may ask you questions about your:  Alcohol use.  Tobacco use.  Drug use.  Emotional well-being.  Home and relationship well-being.  Sexual activity.  Eating habits.  History of falls.  Memory and ability to understand (cognition).  Work and work Astronomer.  Reproductive health. Screening  You may have the following tests or measurements:  Height, weight, and BMI.  Blood pressure.  Lipid and cholesterol levels. These may be checked every 5 years, or more frequently if you are over 76 years old.  Skin check.  Lung cancer screening. You may have this screening every year starting at age 60 if you have a 30-pack-year history of smoking and currently smoke or have quit within the past 15 years.  Fecal occult blood test (FOBT) of the stool. You may have this test every year starting at age 89.  Flexible sigmoidoscopy or colonoscopy. You may have a sigmoidoscopy every 5 years or a colonoscopy every 10 years starting at age 26.  Hepatitis C blood test.  Hepatitis B blood test.  Sexually transmitted disease (STD) testing.  Diabetes screening. This is done by checking your blood sugar (glucose) after you have not eaten for a while (fasting). You may have this done every 1-3 years.  Bone density scan. This is done to screen for osteoporosis. You may have this done starting at age 43.  Mammogram. This may be done every 1-2 years. Talk to your health care provider about how often you should have regular mammograms. Talk with your health care provider about your test results, treatment options, and if necessary, the need for more  tests. Vaccines  Your health care provider may recommend certain vaccines, such as:  Influenza vaccine. This is recommended every year.  Tetanus, diphtheria, and acellular pertussis (Tdap, Td) vaccine. You may need a Td booster every 10 years.  Zoster vaccine. You may need this after  age 33.  Pneumococcal 13-valent conjugate (PCV13) vaccine. One dose is recommended after age 76.  Pneumococcal polysaccharide (PPSV23) vaccine. One dose is recommended after age 80. Talk to your health care provider about which screenings and vaccines you need and how often you need them. This information is not intended to replace advice given to you by your health care provider. Make sure you discuss any questions you have with your health care provider. Document Released: 11/26/2015 Document Revised: 07/19/2016 Document Reviewed: 08/31/2015 Elsevier Interactive Patient Education  2017 [employer] Prevention in the Home Falls can cause injuries. They can happen to people of all ages. There are many things you can do to make your home safe and to help prevent falls. What can I do on the outside of my home?  Regularly fix the edges of walkways and driveways and fix any cracks.  Remove anything that might make you trip as you walk through a door, such as a raised step or threshold.  Trim any bushes or trees on the path to your home.  Use bright outdoor lighting.  Clear any walking paths of anything that might make someone trip, such as rocks or tools.  Regularly check to see if handrails are loose or broken. Make sure that both sides of any steps have handrails.  Any raised decks and porches should have guardrails on the edges.  Have any leaves, snow, or ice cleared regularly.  Use sand or salt on walking paths during winter.  Clean up any spills in your garage right away. This includes oil or grease spills. What can I do in the bathroom?  Use night lights.  Install grab bars by the toilet and in the tub and shower. Do not use towel bars as grab bars.  Use non-skid mats or decals in the tub or shower.  If you need to sit down in the shower, use a plastic, non-slip stool.  Keep the floor dry. Clean up any water that spills on the floor as soon as it  happens.  Remove soap buildup in the tub or shower regularly.  Attach bath mats securely with double-sided non-slip rug tape.  Do not have throw rugs and other things on the floor that can make you trip. What can I do in the bedroom?  Use night lights.  Make sure that you have a light by your bed that is easy to reach.  Do not use any sheets or blankets that are too big for your bed. They should not hang down onto the floor.  Have a firm chair that has side arms. You can use this for support while you get dressed.  Do not have throw rugs and other things on the floor that can make you trip. What can I do in the kitchen?  Clean up any spills right away.  Avoid walking on wet floors.  Keep items that you use a lot in easy-to-reach places.  If you need to reach something above you, use a strong step stool that has a grab bar.  Keep electrical cords out of the way.  Do not use floor polish or wax that makes floors slippery. If you must use wax, use non-skid floor  wax.  Do not have throw rugs and other things on the floor that can make you trip. What can I do with my stairs?  Do not leave any items on the stairs.  Make sure that there are handrails on both sides of the stairs and use them. Fix handrails that are broken or loose. Make sure that handrails are as long as the stairways.  Check any carpeting to make sure that it is firmly attached to the stairs. Fix any carpet that is loose or worn.  Avoid having throw rugs at the top or bottom of the stairs. If you do have throw rugs, attach them to the floor with carpet tape.  Make sure that you have a light switch at the top of the stairs and the bottom of the stairs. If you do not have them, ask someone to add them for you. What else can I do to help prevent falls?  Wear shoes that:  Do not have high heels.  Have rubber bottoms.  Are comfortable and fit you well.  Are closed at the toe. Do not wear sandals.  If you  use a stepladder:  Make sure that it is fully opened. Do not climb a closed stepladder.  Make sure that both sides of the stepladder are locked into place.  Ask someone to hold it for you, if possible.  Clearly mark and make sure that you can see:  Any grab bars or handrails.  First and last steps.  Where the edge of each step is.  Use tools that help you move around (mobility aids) if they are needed. These include:  Canes.  Walkers.  Scooters.  Crutches.  Turn on the lights when you go into a dark area. Replace any light bulbs as soon as they burn out.  Set up your furniture so you have a clear path. Avoid moving your furniture around.  If any of your floors are uneven, fix them.  If there are any pets around you, be aware of where they are.  Review your medicines with your doctor. Some medicines can make you feel dizzy. This can increase your chance of falling. Ask your doctor what other things that you can do to help prevent falls. This information is not intended to replace advice given to you by your health care provider. Make sure you discuss any questions you have with your health care provider. Document Released: 08/26/2009 Document Revised: 04/06/2016 Document Reviewed: 12/04/2014 Elsevier Interactive Patient Education  2017 [employer].

## 2021-02-02 DIAGNOSIS — E785 Hyperlipidemia, unspecified: Secondary | ICD-10-CM | POA: Diagnosis not present

## 2021-02-02 DIAGNOSIS — I10 Essential (primary) hypertension: Secondary | ICD-10-CM | POA: Diagnosis not present

## 2021-02-04 DIAGNOSIS — J449 Chronic obstructive pulmonary disease, unspecified: Secondary | ICD-10-CM | POA: Diagnosis not present

## 2021-02-04 DIAGNOSIS — R197 Diarrhea, unspecified: Secondary | ICD-10-CM | POA: Diagnosis not present

## 2021-02-04 DIAGNOSIS — F0390 Unspecified dementia without behavioral disturbance: Secondary | ICD-10-CM | POA: Diagnosis not present

## 2021-02-04 DIAGNOSIS — D649 Anemia, unspecified: Secondary | ICD-10-CM | POA: Diagnosis not present

## 2021-02-04 DIAGNOSIS — F419 Anxiety disorder, unspecified: Secondary | ICD-10-CM | POA: Diagnosis not present

## 2021-02-17 DIAGNOSIS — J449 Chronic obstructive pulmonary disease, unspecified: Secondary | ICD-10-CM | POA: Diagnosis not present

## 2021-02-17 DIAGNOSIS — F0390 Unspecified dementia without behavioral disturbance: Secondary | ICD-10-CM | POA: Diagnosis not present

## 2021-02-17 DIAGNOSIS — F419 Anxiety disorder, unspecified: Secondary | ICD-10-CM | POA: Diagnosis not present

## 2021-07-23 ENCOUNTER — Other Ambulatory Visit: Payer: Self-pay

## 2021-07-23 ENCOUNTER — Emergency Department (HOSPITAL_COMMUNITY)
Admission: EM | Admit: 2021-07-23 | Discharge: 2021-07-23 | Disposition: A | Discharge status: Home / Self Care | Payer: Medicare Other | Attending: Emergency Medicine | Admitting: Emergency Medicine

## 2021-07-23 ENCOUNTER — Encounter (HOSPITAL_COMMUNITY): Payer: Self-pay

## 2021-07-23 ENCOUNTER — Emergency Department (HOSPITAL_COMMUNITY): Payer: Medicare Other

## 2021-07-23 DIAGNOSIS — Z7952 Long term (current) use of systemic steroids: Secondary | ICD-10-CM | POA: Insufficient documentation

## 2021-07-23 DIAGNOSIS — J449 Chronic obstructive pulmonary disease, unspecified: Secondary | ICD-10-CM | POA: Diagnosis not present

## 2021-07-23 DIAGNOSIS — Z87891 Personal history of nicotine dependence: Secondary | ICD-10-CM | POA: Insufficient documentation

## 2021-07-23 DIAGNOSIS — Z79899 Other long term (current) drug therapy: Secondary | ICD-10-CM | POA: Insufficient documentation

## 2021-07-23 DIAGNOSIS — I10 Essential (primary) hypertension: Secondary | ICD-10-CM | POA: Diagnosis not present

## 2021-07-23 DIAGNOSIS — Z7982 Long term (current) use of aspirin: Secondary | ICD-10-CM | POA: Diagnosis not present

## 2021-07-23 DIAGNOSIS — I639 Cerebral infarction, unspecified: Secondary | ICD-10-CM | POA: Diagnosis not present

## 2021-07-23 DIAGNOSIS — N3090 Cystitis, unspecified without hematuria: Secondary | ICD-10-CM

## 2021-07-23 DIAGNOSIS — F0390 Unspecified dementia without behavioral disturbance: Secondary | ICD-10-CM | POA: Diagnosis not present

## 2021-07-23 DIAGNOSIS — R29810 Facial weakness: Secondary | ICD-10-CM | POA: Diagnosis not present

## 2021-07-23 LAB — URINALYSIS, MICROSCOPIC (REFLEX)

## 2021-07-23 LAB — I-STAT ARTERIAL BLOOD GAS, ED
Acid-base deficit: 3 mmol/L — ABNORMAL HIGH (ref 0.0–2.0)
Bicarbonate: 23.1 mmol/L (ref 20.0–28.0)
Calcium, Ion: 1.27 mmol/L (ref 1.15–1.40)
HCT: 26 % — ABNORMAL LOW (ref 36.0–46.0)
Hemoglobin: 8.8 g/dL — ABNORMAL LOW (ref 12.0–15.0)
O2 Saturation: 92 %
Potassium: 4.1 mmol/L (ref 3.5–5.1)
Sodium: 142 mmol/L (ref 135–145)
TCO2: 24 mmol/L (ref 22–32)
pCO2 arterial: 44.5 mmHg (ref 32.0–48.0)
pH, Arterial: 7.324 — ABNORMAL LOW (ref 7.350–7.450)
pO2, Arterial: 70 mmHg — ABNORMAL LOW (ref 83.0–108.0)

## 2021-07-23 LAB — URINALYSIS, ROUTINE W REFLEX MICROSCOPIC
Bilirubin Urine: NEGATIVE
Glucose, UA: NEGATIVE mg/dL
Ketones, ur: NEGATIVE mg/dL
Nitrite: POSITIVE — AB
Protein, ur: NEGATIVE mg/dL
Specific Gravity, Urine: 1.015 (ref 1.005–1.030)
pH: 6 (ref 5.0–8.0)

## 2021-07-23 LAB — COMPREHENSIVE METABOLIC PANEL
ALT: 13 U/L (ref 0–44)
AST: 21 U/L (ref 15–41)
Albumin: 3 g/dL — ABNORMAL LOW (ref 3.5–5.0)
Alkaline Phosphatase: 76 U/L (ref 38–126)
Anion gap: 7 (ref 5–15)
BUN: 24 mg/dL — ABNORMAL HIGH (ref 8–23)
CO2: 24 mmol/L (ref 22–32)
Calcium: 9 mg/dL (ref 8.9–10.3)
Chloride: 109 mmol/L (ref 98–111)
Creatinine, Ser: 1.29 mg/dL — ABNORMAL HIGH (ref 0.44–1.00)
GFR, Estimated: 42 mL/min — ABNORMAL LOW (ref >60)
Glucose, Bld: 94 mg/dL (ref 70–99)
Potassium: 4 mmol/L (ref 3.5–5.1)
Sodium: 140 mmol/L (ref 135–145)
Total Bilirubin: 0.2 mg/dL — ABNORMAL LOW (ref 0.3–1.2)
Total Protein: 6.7 g/dL (ref 6.5–8.1)

## 2021-07-23 LAB — CBC
HCT: 30.4 % — ABNORMAL LOW (ref 36.0–46.0)
Hemoglobin: 9.2 g/dL — ABNORMAL LOW (ref 12.0–15.0)
MCH: 26.9 pg (ref 26.0–34.0)
MCHC: 30.3 g/dL (ref 30.0–36.0)
MCV: 88.9 fL (ref 80.0–100.0)
Platelets: 208 10*3/uL (ref 150–400)
RBC: 3.42 MIL/uL — ABNORMAL LOW (ref 3.87–5.11)
RDW: 19.2 % — ABNORMAL HIGH (ref 11.5–15.5)
WBC: 5.9 10*3/uL (ref 4.0–10.5)
nRBC: 0 % (ref 0.0–0.2)

## 2021-07-23 LAB — PROTIME-INR
INR: 1 (ref 0.8–1.2)
Prothrombin Time: 13.2 seconds (ref 11.4–15.2)

## 2021-07-23 LAB — TROPONIN I (HIGH SENSITIVITY): Troponin I (High Sensitivity): 6 ng/L (ref <18)

## 2021-07-23 LAB — APTT: aPTT: 27 seconds (ref 24–36)

## 2021-07-23 MED ORDER — SULFAMETHOXAZOLE-TRIMETHOPRIM 800-160 MG PO TABS
1.0000 | ORAL_TABLET | Freq: Once | ORAL | Status: AC
  Administered 2021-07-23: 1 via ORAL
  Filled 2021-07-23: qty 1

## 2021-07-23 MED ORDER — SULFAMETHOXAZOLE-TRIMETHOPRIM 800-160 MG PO TABS: 1.0000 | ORAL_TABLET | Freq: Two times a day (BID) | ORAL | 0 refills | Status: AC

## 2021-07-23 MED ORDER — OXYCODONE-ACETAMINOPHEN 5-325 MG PO TABS
2.0000 | ORAL_TABLET | Freq: Once | ORAL | Status: AC
  Administered 2021-07-23: 2 via ORAL
  Filled 2021-07-23: qty 2

## 2021-07-23 MED ORDER — SODIUM CHLORIDE 0.9 % IV BOLUS
500.0000 mL | Freq: Once | INTRAVENOUS | Status: AC
  Administered 2021-07-23: 500 mL via INTRAVENOUS

## 2021-07-23 MED ORDER — OXYCODONE-ACETAMINOPHEN 5-325 MG PO TABS
1.0000 | ORAL_TABLET | Freq: Once | ORAL | Status: AC
  Administered 2021-07-23: 1 via ORAL
  Filled 2021-07-23: qty 1

## 2021-07-23 NOTE — ED Notes (Signed)
Patient returns from CT scan.

## 2021-07-23 NOTE — ED Provider Notes (Signed)
80 yo female presenting from home with concern for right sided weakness and possible left facial droop today, unclear last known well.  Her daughter reports that the patient does have some confusion, but seem more confused than normal today, and when she went to visit noted that the patient had possibly a mild facial droop on the lower left side of her face.  This is resolved with the patient's arrival in the ED.  The patient is at her baseline mental status and not have any acute complaints.  It is not clear from either of them whether she had a weakness in her right arm yesterday, although the patient reports she intermittently has some weakness in her right leg.  Her daughter reports to me the patient is currently at a SNF facility.  The patient has chronic hypotension and is known to have very low blood pressures.  Patient on aricept for dementia, which has been developing a worsening progressively since 2018.  On my exam the patient appears calm, cooperative.  She is able to answer simple questions appropriately.  She does not have any obvious facial droop or neurodeficits.  She does not have any focal weakness.  Her CT scan and MRI did not show evidence of acute infarct.  We are still awaiting UA to rule out UTI.  Otherwise her labs appear to be normal and within baseline levels.  She is chronically hypotensive, but has no evidence of dehydration or sepsis.  She is already on baby aspirin.  If her UA is otherwise unremarkable, I think it be reasonable for her to follow-up as an outpatient for this issue.  Her daughter is in agreement.  *  UA consistent with UTI.  Based on prior urine culture and hx of penicillin allergies, will start bactrim here.  Patient and daughter informed.  Okay for discharge - family to take her home.   Terald Sleeper, MD 07/24/21 [phone]

## 2021-07-23 NOTE — ED Notes (Addendum)
Patient transported to MRI with daughter

## 2021-07-23 NOTE — ED Notes (Signed)
Went to MRI to medicate patient for shoulder pain 10/10

## 2021-07-23 NOTE — Discharge Instructions (Addendum)
Angelica [name] was worked up for possible stroke symptoms in the ER.  Her MRI scan and CT scan were reassuring.  We did not see signs of a stroke.  She did not have any neurological deficits on exam.  Her work-up was only notable for signs of a urine infection.  We started her on Bactrim and prescribed this for 5 days.  She received her first dose in the ER today.  I recommend she continue this medication complete a 5-day course.  A prescription was provided to her daughter who is taking her home tonight.  A copy of her labs and images are attached.

## 2021-07-23 NOTE — ED Triage Notes (Signed)
Patient BIB [facility], states patient started having right sided weakness yesterday at ~ 1430.Also has right sided facial droop

## 2021-07-23 NOTE — ED Notes (Signed)
Patient given discharge instructions, all questions answered. Patient in possession of all belongings, directed to the discharge area  

## 2021-07-23 NOTE — ED Notes (Signed)
Got patient into a gown on the monitor did ekg shown to er provider patient is resting with call bell in reach  

## 2021-07-23 NOTE — ED Notes (Signed)
Patient returned from MRI.

## 2021-07-23 NOTE — ED Provider Notes (Signed)
[facility] EMERGENCY DEPARTMENT Provider Note   CSN: [id number] Arrival date & time:        History Chief Complaint  Patient presents with   Facial Droop   Right Sided Weakness    Angelica [name] is a 80 y.o. female.  HPI Patient is a 80 year old female who presents from home for reported symptoms of left-sided facial droop, right-sided weakness, and right eyelid droop.  Initial report from EMS was that symptoms began 24 hours ago.  On arrival, patient denies any current discomfort.  She denies any current areas of numbness or weakness.  She does endorse chronic pain in the area of her left shoulder.  She also states that she has pain in her back and neck which are also chronic condition for her.  She states that she takes scheduled 20 mg oxycodone at home.  Shortly after her arrival, patient's is joined by her daughter at bedside.  Patient daughter has been with her over the past 24 hours.  Patient's daughter states that her mother slept most of the morning.  At home, did seem like the left side of her mouth was drooping.  Currently, patient's mouth appears normal to her daughter.  Patient's right eyelid does continue to appear drooping to her daughter.    Past Medical History:  Diagnosis Date   Allergic rhinitis    Allergy    Anxiety    Blood transfusion    Cataract    Chronic back pain    sees Dr. Odette Fraction at [facility]    COPD (chronic obstructive pulmonary disease) (HCC)    Depression    Diverticulosis    hx   Gallstones    GERD (gastroesophageal reflux disease)    Heart murmur    Hiatal hernia    HLD (hyperlipidemia)    HTN (hypertension)    Irregular cardiac rhythm    Osteoarthritis    Osteoporosis    PUD (peptic ulcer disease)    duodenal 1980s    Patient Active Problem List   Diagnosis Date Noted   Mild dementia (HCC) 09/14/2017   Spinal stenosis of lumbar region 01/18/2013   Tobacco abuse 03/26/2012   Insomnia 03/26/2012    INGUINAL HERNIA 10/18/2010   ANXIETY STATE, UNSPECIFIED 02/08/2009   NICOTINE ADDICTION 09/28/2008   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 Venetia Maxon   CHOLECYSTECTOMY     COLONOSCOPY  06/25/2018   per Dr. Leone Payor, diverticulosis, no polyps, no repeats needed    HERNIA REPAIR Right    L shoulder surgery  2007   Dr. Teressa Senter   LUMBAR DISC SURGERY  1986     OB History   No obstetric history on file.     Family History  Problem Relation Age of Onset   Heart disease Father    Cancer - Cervical Daughter    Alzheimer's disease Mother    Colon cancer Neg Hx    Esophageal cancer Neg Hx    Stomach cancer Neg Hx    Rectal cancer Neg Hx     Social History   Tobacco Use   Smoking status: Former    Packs/day: 1.00    Types: Cigarettes   Smokeless tobacco: Never   Tobacco  comments:    less than a pack  Substance Use Topics   Alcohol use: No    Alcohol/week: 0.0 standard drinks   Drug use: No    Home Medications Prior to Admission medications   Medication Sig Start Date End Date Taking? Authorizing Provider  albuterol (VENTOLIN HFA) 108 (90 Base) MCG/ACT inhaler Inhale 2 puffs into the lungs every 4 (four) hours as needed for wheezing or shortness of breath. 06/03/20   Terressa Koyanagi, DO  alprazolam [name]) 2 MG tablet Take 1 tablet (2 mg total) by mouth at bedtime as needed. 09/27/20   Nelwyn Salisbury, MD  aspirin 81 MG EC tablet Take 81 mg by mouth daily.    [provider]  atenolol (TENORMIN) 25 MG tablet TAKE 1 TABLET EVERY DAY 03/05/19   Nelwyn Salisbury, MD  baclofen (LIORESAL) 10 MG tablet TAKE 1 TABLET THREE TIMES DAILY AS NEEDED 03/05/19   Nelwyn Salisbury, MD  benzonatate (TESSALON) 100 MG capsule Take 1  capsule (100 mg total) by mouth 2 (two) times daily as needed for cough. 06/03/20   Terressa Koyanagi, DO  cetirizine-pseudoephedrine (ZYRTEC-D) 5-120 MG per tablet Take 1 tablet by mouth 2 (two) times daily.    [provider]  diclofenac sodium (VOLTAREN) 1 % GEL Apply 1 application topically every 6 (six) hours as needed (knee pain ). 11/29/18   Nelwyn Salisbury, MD  donepezil (ARICEPT) 10 MG tablet Take 1 tablet daily 09/27/20   Nelwyn Salisbury, MD  fluticasone-salmeterol (ADVAIR HFA) [phone] MCG/ACT inhaler Inhale 2 puffs into the lungs 2 (two) times daily. 09/27/20   Nelwyn Salisbury, MD  gabapentin (NEURONTIN) 100 MG capsule TAKE 1 CAPSULE (100 MG TOTAL) BY MOUTH 3 (THREE) TIMES DAILY. 11/08/20   Nelwyn Salisbury, MD  ibuprofen (ADVIL) 800 MG tablet Take 1 tablet (800 mg total) by mouth every 8 (eight) hours as needed. 10/27/20   Nelwyn Salisbury, MD  loratadine (CLARITIN) 10 MG tablet Take 1 tablet (10 mg total) by mouth daily. 09/17/20   Nelwyn Salisbury, MD  omeprazole (PRILOSEC) 40 MG capsule Take 1 capsule (40 mg total) by mouth in the morning. 07/09/20   Nelwyn Salisbury, MD  polyethylene glycol powder (GLYCOLAX/MIRALAX) powder Take 17 g by mouth daily.    [provider]  promethazine (PHENERGAN) 25 MG tablet Take 1 tablet (25 mg total) by mouth every 4 (four) hours as needed for nausea. 05/06/18   Nelwyn Salisbury, MD  sertraline (ZOLOFT) 100 MG tablet TAKE 1 TABLET EVERY DAY 03/05/19   Nelwyn Salisbury, MD  VENTOLIN HFA 108 [phone] Base) MCG/ACT inhaler Inhale 2 puffs into the lungs every 4 (four) hours as needed for shortness of breath. 06/19/19   Nelwyn Salisbury, MD    Allergies    Codeine and Penicillins  Review of Systems   Review of Systems  Constitutional:  Negative for chills and fever.  HENT:  Negative for ear pain and sore throat.   Eyes:  Negative for pain and visual disturbance.  Respiratory:  Negative for cough and shortness of breath.   Cardiovascular:  Negative for chest pain and  palpitations.  Gastrointestinal:  Negative for abdominal pain, diarrhea, nausea and vomiting.  Genitourinary:  Negative for dysuria and hematuria.  Musculoskeletal:  Positive for arthralgias (Chronic), back pain (Chronic) and neck pain (Chronic). Negative for joint swelling.  Skin:  Negative for color change and rash.  Neurological:  Positive for tremors, facial  asymmetry, weakness and numbness. Negative for seizures, syncope and headaches.       Symptoms are now resolved  All other systems reviewed and are negative.  Physical Exam Updated Vital Signs BP (!) 109/48   Pulse (!) 59   Temp 98.8 F (37.1 C)   Resp 12   Ht  (1.575 m)   Wt 58.5 kg   SpO2 95%   BMI 23.59 kg/m   Physical Exam Vitals and nursing note reviewed.  Constitutional:      General: She is not in acute distress.    Appearance: Normal appearance. She is well-developed. She is not ill-appearing, toxic-appearing or diaphoretic.  HENT:     Head: Normocephalic and atraumatic.     Right Ear: External ear normal.     Left Ear: External ear normal.     Nose: Nose normal.     Mouth/Throat:     Mouth: Mucous membranes are moist.     Pharynx: Oropharynx is clear.  Eyes:     General: No visual field deficit.    Extraocular Movements: Extraocular movements intact.     Conjunctiva/sclera: Conjunctivae normal.     Comments: Slight right-sided eye droop, however, patient is able to fully open eyelids on both eyes  Cardiovascular:     Rate and Rhythm: Normal rate and regular rhythm.     Heart sounds: No murmur heard. Pulmonary:     Effort: Pulmonary effort is normal. No respiratory distress.     Breath sounds: Normal breath sounds. No wheezing or rales.  Abdominal:     Palpations: Abdomen is soft.     Tenderness: There is no abdominal tenderness.  Musculoskeletal:        General: No swelling or deformity.     Cervical back: Normal range of motion and neck supple.     Right lower leg: No edema.     Left lower  leg: No edema.  Skin:    General: Skin is warm and dry.  Neurological:     General: No focal deficit present.     Mental Status: She is alert.     Cranial Nerves: Cranial nerves are intact. No cranial nerve deficit, dysarthria or facial asymmetry.     Sensory: Sensation is intact. No sensory deficit.     Motor: Motor function is intact. No weakness or abnormal muscle tone.     Coordination: Coordination is intact. Finger-Nose-Finger Test normal.  Psychiatric:        Mood and Affect: Mood normal.        Behavior: Behavior normal.    ED Results / Procedures / Treatments   Labs (all labs ordered are listed, but only abnormal results are displayed) Labs Reviewed  CBC - Abnormal; Notable for the following components:      Result Value   RBC 3.42 (*)    Hemoglobin 9.2 (*)    HCT 30.4 (*)    RDW 19.2 (*)    All other components within normal limits  COMPREHENSIVE METABOLIC PANEL - Abnormal; Notable for the following components:   BUN 24 (*)    Creatinine, Ser 1.29 (*)    Albumin 3.0 (*)    Total Bilirubin 0.2 (*)    GFR, Estimated 42 (*)    All other components within normal limits  I-STAT ARTERIAL BLOOD GAS, ED - Abnormal; Notable for the following components:   pH, Arterial 7.324 (*)    pO2, Arterial 70 (*)    Acid-base deficit 3.0 (*)  HCT 26.0 (*)    Hemoglobin 8.8 (*)    All other components within normal limits  PROTIME-INR  APTT  BLOOD GAS, ARTERIAL  URINALYSIS, ROUTINE W REFLEX MICROSCOPIC  TROPONIN I (HIGH SENSITIVITY)  TROPONIN I (HIGH SENSITIVITY)    EKG None  Radiology CT HEAD WO CONTRAST  Result Date: 07/23/2021 CLINICAL DATA:  Altered mental status. Right-sided weakness since yesterday at approximately 2:30 p.m. Right facial droop. EXAM: CT HEAD WITHOUT CONTRAST TECHNIQUE: Contiguous axial images were obtained from the base of the skull through the vertex without intravenous contrast. COMPARISON:  Brain MR dated 04/18/2017 FINDINGS: Brain: Mild patchy  white matter low density in both cerebral hemispheres. Normal size and position of the ventricles. No intracranial hemorrhage, mass lesion or CT evidence of acute infarction. Vascular: No hyperdense vessel or unexpected calcification. Skull: Normal. Negative for fracture or focal lesion. Sinuses/Orbits: Right posterior ethmoid mucosal thickening. Status post bilateral cataract extraction. Other: Right concha bullosa. IMPRESSION: No acute abnormality. Electronically Signed   By: Beckie Salts M.D.   On: 07/23/2021 15:23    Procedures Procedures   Medications Ordered in ED Medications  oxyCODONE-acetaminophen (PERCOCET/ROXICET) 5-325 MG per tablet 2 tablet (2 tablets Oral Given 07/23/21 1554)    ED Course  I have reviewed the triage vital signs and the nursing notes.  Pertinent labs & imaging results that were available during my care of the patient were reviewed by me and considered in my medical decision making (see chart for details).    MDM Rules/Calculators/A&P                           Patient is a 80 year old female with no known history of stroke, who presents for strokelike symptoms at home.  Upon her arrival in the ED, symptoms have resolved.  Presentation concerning for TIA.  CT Noncon of head and MRI of brain were ordered.  Laboratory work-up was initiated to identify possible underlying metabolic/infectious causes.  Patient endorses chronic left shoulder, back, and neck pain.  She states that she is due for her home pain medication.  10 mg oxycodone was ordered.  Care of patient was signed out to oncoming ED provider.  Final Clinical Impression(s) / ED Diagnoses Final diagnoses:  Stroke-like symptoms    Rx / DC Orders ED Discharge Orders     None        Gloris Manchester, MD 07/23/21 [phone]

## 2021-07-26 LAB — URINE CULTURE: Culture: >=100000 — AB

## 2021-07-27 ENCOUNTER — Telehealth: Payer: Self-pay | Admitting: *Deleted

## 2021-07-27 NOTE — Telephone Encounter (Signed)
Post ED Visit - Positive Culture Follow-up  Culture report reviewed by antimicrobial stewardship pharmacist: [facility] []  , Pharm.D. []  Enzo Bi, Pharm.D., BCPS AQ-ID []  , Pharm.D., BCPS []  Celedonio Miyamoto, Pharm.D., BCPS []  [place], Garvin Fila.D., BCPS, AAHIVP []  , Pharm.D., BCPS, AAHIVP []  Georgina Pillion, PharmD, BCPS []  , PharmD, BCPS []  [place], PharmD, BCPS []  [address], PharmD []  , PharmD, BCPS []  Estella Husk, PharmD  Pharmacy Team []  Lysle Pearl, PharmD []  , PharmD []  [name] Climes, PharmD []  , Rph []  Agapito Games) , PharmD []  Verlan Friends, PharmD []  , PharmD []  Mervyn Gay, PharmD []  , PharmD []  Vinnie Level, PharmD []  [facility], PharmD []  , PharmD []  Len Childs, PharmD   Positive urine culture Treated with Sulfamethoxazole-Trimethoprim, organism sensitive to the same and no further patient follow-up is required at this time.  , PharmD  Greer Pickerel Talley 07/27/2021, 8:23 AM

## 2022-10-16 ENCOUNTER — Telehealth: Payer: Self-pay | Admitting: Family Medicine

## 2022-10-16 NOTE — Telephone Encounter (Signed)
Left message for patient to call back and schedule Medicare Annual Wellness Visit (AWV) either virtually or phone. Left  my Zachery Conch number [phone]   Last AWV  02/01/21 please schedule with Dahlia Client kim   45 min for awv-i and in office appointments 30 min for awv-s  phone/virtual appointments

## 2023-11-20 ENCOUNTER — Telehealth: Payer: Self-pay | Admitting: Family Medicine

## 2023-11-20 NOTE — Telephone Encounter (Signed)
 LM for pt to call back.
